# Patient Record
Sex: Male | Born: 1935 | Race: White | Hispanic: No | Marital: Married | Smoking: Former smoker
Health system: Southern US, Community
[De-identification: ages and names within clinical notes are randomized; demographics above are authoritative.]

## PROBLEM LIST (undated history)

## (undated) DIAGNOSIS — E7800 Pure hypercholesterolemia, unspecified: Secondary | ICD-10-CM

## (undated) DIAGNOSIS — B029 Zoster without complications: Secondary | ICD-10-CM

## (undated) DIAGNOSIS — R519 Headache, unspecified: Secondary | ICD-10-CM

## (undated) DIAGNOSIS — IMO0001 Reserved for inherently not codable concepts without codable children: Secondary | ICD-10-CM

## (undated) DIAGNOSIS — R51 Headache: Secondary | ICD-10-CM

## (undated) DIAGNOSIS — F419 Anxiety disorder, unspecified: Secondary | ICD-10-CM

## (undated) DIAGNOSIS — R112 Nausea with vomiting, unspecified: Secondary | ICD-10-CM

## (undated) DIAGNOSIS — E559 Vitamin D deficiency, unspecified: Secondary | ICD-10-CM

## (undated) DIAGNOSIS — I4891 Unspecified atrial fibrillation: Secondary | ICD-10-CM

## (undated) DIAGNOSIS — N400 Enlarged prostate without lower urinary tract symptoms: Secondary | ICD-10-CM

## (undated) DIAGNOSIS — I499 Cardiac arrhythmia, unspecified: Secondary | ICD-10-CM

## (undated) DIAGNOSIS — Z87442 Personal history of urinary calculi: Secondary | ICD-10-CM

## (undated) DIAGNOSIS — R42 Dizziness and giddiness: Secondary | ICD-10-CM

## (undated) DIAGNOSIS — K219 Gastro-esophageal reflux disease without esophagitis: Secondary | ICD-10-CM

## (undated) DIAGNOSIS — M1990 Unspecified osteoarthritis, unspecified site: Secondary | ICD-10-CM

## (undated) DIAGNOSIS — I10 Essential (primary) hypertension: Secondary | ICD-10-CM

## (undated) DIAGNOSIS — E119 Type 2 diabetes mellitus without complications: Secondary | ICD-10-CM

## (undated) DIAGNOSIS — J40 Bronchitis, not specified as acute or chronic: Secondary | ICD-10-CM

## (undated) DIAGNOSIS — K589 Irritable bowel syndrome without diarrhea: Secondary | ICD-10-CM

## (undated) DIAGNOSIS — N189 Chronic kidney disease, unspecified: Secondary | ICD-10-CM

## (undated) DIAGNOSIS — G4730 Sleep apnea, unspecified: Secondary | ICD-10-CM

## (undated) DIAGNOSIS — M4800 Spinal stenosis, site unspecified: Secondary | ICD-10-CM

## (undated) DIAGNOSIS — H9190 Unspecified hearing loss, unspecified ear: Secondary | ICD-10-CM

## (undated) DIAGNOSIS — R4189 Other symptoms and signs involving cognitive functions and awareness: Secondary | ICD-10-CM

## (undated) DIAGNOSIS — Z9889 Other specified postprocedural states: Secondary | ICD-10-CM

## (undated) HISTORY — PX: TONSILLECTOMY: SUR1361

## (undated) HISTORY — PX: DENTAL SURGERY: SHX609

## (undated) HISTORY — PX: COLONOSCOPY: SHX174

---

## 2011-07-13 DIAGNOSIS — R972 Elevated prostate specific antigen [PSA]: Secondary | ICD-10-CM | POA: Insufficient documentation

## 2011-07-13 DIAGNOSIS — I10 Essential (primary) hypertension: Secondary | ICD-10-CM | POA: Insufficient documentation

## 2011-07-13 DIAGNOSIS — M545 Low back pain, unspecified: Secondary | ICD-10-CM | POA: Insufficient documentation

## 2011-07-13 DIAGNOSIS — I4891 Unspecified atrial fibrillation: Secondary | ICD-10-CM | POA: Insufficient documentation

## 2011-07-13 DIAGNOSIS — R21 Rash and other nonspecific skin eruption: Secondary | ICD-10-CM | POA: Insufficient documentation

## 2011-07-13 DIAGNOSIS — E119 Type 2 diabetes mellitus without complications: Secondary | ICD-10-CM | POA: Insufficient documentation

## 2011-07-13 DIAGNOSIS — G4730 Sleep apnea, unspecified: Secondary | ICD-10-CM | POA: Insufficient documentation

## 2011-07-13 DIAGNOSIS — E7800 Pure hypercholesterolemia, unspecified: Secondary | ICD-10-CM | POA: Insufficient documentation

## 2011-07-13 DIAGNOSIS — R002 Palpitations: Secondary | ICD-10-CM | POA: Insufficient documentation

## 2011-12-03 DIAGNOSIS — IMO0001 Reserved for inherently not codable concepts without codable children: Secondary | ICD-10-CM | POA: Insufficient documentation

## 2012-12-07 DIAGNOSIS — J329 Chronic sinusitis, unspecified: Secondary | ICD-10-CM | POA: Insufficient documentation

## 2012-12-07 DIAGNOSIS — R058 Other specified cough: Secondary | ICD-10-CM | POA: Insufficient documentation

## 2012-12-07 DIAGNOSIS — R05 Cough: Secondary | ICD-10-CM | POA: Insufficient documentation

## 2012-12-29 DIAGNOSIS — R05 Cough: Secondary | ICD-10-CM | POA: Insufficient documentation

## 2012-12-29 DIAGNOSIS — R053 Chronic cough: Secondary | ICD-10-CM | POA: Insufficient documentation

## 2013-04-21 DIAGNOSIS — M549 Dorsalgia, unspecified: Secondary | ICD-10-CM | POA: Insufficient documentation

## 2013-04-21 DIAGNOSIS — M7070 Other bursitis of hip, unspecified hip: Secondary | ICD-10-CM | POA: Insufficient documentation

## 2013-06-21 DIAGNOSIS — M48061 Spinal stenosis, lumbar region without neurogenic claudication: Secondary | ICD-10-CM | POA: Insufficient documentation

## 2013-06-23 DIAGNOSIS — R635 Abnormal weight gain: Secondary | ICD-10-CM | POA: Insufficient documentation

## 2013-10-10 DIAGNOSIS — I10 Essential (primary) hypertension: Secondary | ICD-10-CM | POA: Insufficient documentation

## 2013-12-25 ENCOUNTER — Ambulatory Visit: Payer: Self-pay | Admitting: Urology

## 2014-01-01 ENCOUNTER — Emergency Department: Payer: Self-pay | Admitting: Emergency Medicine

## 2014-01-01 LAB — BASIC METABOLIC PANEL
Anion Gap: 7 (ref 7–16)
BUN: 25 mg/dL — ABNORMAL HIGH (ref 7–18)
Calcium, Total: 9.2 mg/dL (ref 8.5–10.1)
Chloride: 103 mmol/L (ref 98–107)
Co2: 28 mmol/L (ref 21–32)
Creatinine: 1.2 mg/dL (ref 0.60–1.30)
EGFR (African American): >60
EGFR (Non-African Amer.): >60
Glucose: 109 mg/dL — ABNORMAL HIGH (ref 65–99)
Osmolality: 281 (ref 275–301)
Potassium: 4.3 mmol/L (ref 3.5–5.1)
Sodium: 138 mmol/L (ref 136–145)

## 2014-01-01 LAB — TROPONIN I: Troponin-I: <0.02 ng/mL

## 2014-01-01 LAB — CBC
HCT: 49.5 % (ref 40.0–52.0)
HGB: 15.8 g/dL (ref 13.0–18.0)
MCH: 27.7 pg (ref 26.0–34.0)
MCHC: 31.9 g/dL — ABNORMAL LOW (ref 32.0–36.0)
MCV: 87 fL (ref 80–100)
Platelet: 199 10*3/uL (ref 150–440)
RBC: 5.71 10*6/uL (ref 4.40–5.90)
RDW: 13.8 % (ref 11.5–14.5)
WBC: 9.5 10*3/uL (ref 3.8–10.6)

## 2014-05-07 ENCOUNTER — Ambulatory Visit
Admit: 2014-05-07 | Disposition: A | Discharge status: Home / Self Care | Payer: Self-pay | Attending: Urology | Admitting: Urology

## 2014-06-19 ENCOUNTER — Encounter: Payer: Self-pay | Admitting: Podiatry

## 2014-06-19 ENCOUNTER — Ambulatory Visit (INDEPENDENT_AMBULATORY_CARE_PROVIDER_SITE_OTHER): Payer: Medicare Other | Admitting: Podiatry

## 2014-06-19 VITALS — BP 117/46 | HR 66 | Resp 16 | Ht 70.0 in | Wt 226.0 lb

## 2014-06-19 DIAGNOSIS — L600 Ingrowing nail: Secondary | ICD-10-CM

## 2014-06-19 MED ORDER — CEPHALEXIN 500 MG PO CAPS: 500.0000 mg | ORAL_CAPSULE | Freq: Three times a day (TID) | ORAL | Status: DC

## 2014-06-19 NOTE — Patient Instructions (Signed)

## 2014-06-19 NOTE — Progress Notes (Signed)
Subjective:    Patient ID: Jared Aye., male    DOB: 12-15-1932, 81 y.o.   MRN: [id number]  HPI  81 year old male presents the office today with concern of an infected ingrown toenail to the  Right big toe. He states his been ongoing for approximately 10 days. He states that he keep skin recurrent ingrown toenails however nursing his facility will trim the nail. He states he was soaking his foot in Epson salts which seems to help some all of the redness and the pain continues. He denies any recent antibiotic. No other treatments. No other complaints at this time.   Review of Systems  HENT: Positive for hearing loss.   Gastrointestinal: Positive for diarrhea.  Endocrine:       Increase urination  Genitourinary: Positive for urgency and frequency.  Musculoskeletal: Positive for back pain.       Joint pain   Neurological: Positive for light-headedness.  Hematological: Bruises/bleeds easily.  Psychiatric/Behavioral: The patient is nervous/anxious.   All other systems reviewed and are negative.      Objective:   Physical Exam AAO x3, NAD DP/PT pulses palpable bilaterally, CRT less than 3 seconds Protective sensation intact with Simms Weinstein monofilament, vibratory sensation decreased, Achilles tendon reflex intact There is evidence of incurvation of both the medial and lateral aspect of the right hallux toenail tenderness palpation overlying the area. There appears to be significant amount ingrowing along the lateral portion the nail border. There is erythema and edema around the nail border with a small amount of purulence expressed. There is no ascending cellulitis, fluctuance, crepitus, malodor. There is no tenderness of the remaining nails. No other areas of tenderness to bilateral lower extremities. MMT 5/5, ROM WNL.  No open lesions or pre-ulcerative lesions.  No overlying edema, erythema, increase in warmth to bilateral lower extremities.  No pain with calf compression,  swelling, warmth, erythema bilaterally.     Assessment & Plan:   81 year old male right hallux symptomatic ingrown toenail with localized infection -Treatment options discussed including all alternatives, risks, and complications -At this time, recommended partial nail removal without chemical matricectomy to the medial and lateral aspect of the right hallux nail due to infection. Risks and complications were discussed with the patient for which they understand and  verbally consent to the procedure. Under sterile conditions a total of 3 mL of a mixture of 2% lidocaine plain and 0.5% Marcaine plain was infiltrated in a hallux block fashion. Once anesthetized, the skin was prepped in sterile fashion. A tourniquet was then applied. Next the symptomatic border of the hallux nail border was excised making sure to remove the entire offending nail border. A small amount of purulence was expressed. Once the nail was removed, the area was debrided and the underlying skin was intact. The area was irrigated and hemostasis was obtained.  No further purulence was expressed. A dry sterile dressing was applied. After application of the dressing the tourniquet was removed and there is found to be an immediate capillary refill time to the digit. The patient tolerated the procedure well any complications. Post procedure instructions were discussed the patient for which he verbally understood. Follow-up in one week for nail check or sooner if any problems are to arise. Discussed signs/symptoms of worsening infection and directed to call the office immediately should any occur or go directly to the emergency room. In the meantime, encouraged to call the office with any questions, concerns, changes symptoms. -Rx Keflex  -Follow up with  PCP for other issues mentioned in the ROS

## 2014-06-26 ENCOUNTER — Ambulatory Visit (INDEPENDENT_AMBULATORY_CARE_PROVIDER_SITE_OTHER): Payer: Medicare Other | Admitting: Podiatry

## 2014-06-26 DIAGNOSIS — L600 Ingrowing nail: Secondary | ICD-10-CM

## 2014-06-26 DIAGNOSIS — Z9889 Other specified postprocedural states: Secondary | ICD-10-CM

## 2014-06-26 NOTE — Patient Instructions (Signed)
Continue soaking in epsom salts twice a day followed by antibiotic ointment and a band-aid. Can leave uncovered at night. Continue this until completely healed.  Monitor for any signs/symptoms of infection. Call the office immediately if any occur or go directly to the emergency room. Call with any questions/concerns.  

## 2014-06-26 NOTE — Progress Notes (Signed)
Patient ID: Jared Aye., male   DOB: Mar 09, 1932, 81 y.o.   MRN: [id number]  Subjective: 81 year old male presents the office today follow-up evaluation status post right hallux partial nail avulsion preformed last week. He states that overall he is doing well and is no pain to the area. He has been soaking his foot twice a day Epson salts cover with antibiotic ointment and a bandage. He denies any pain to the area denies any surrounding redness, red streaks, drainage/purulence. Denies any systemic complaints as fevers, chills, nausea, vomiting. He's been continuing with antibiotic solid is not taking as directed and takes it twice a day. No other complaints at this time in no acute changes his last appointment.  Objective: AAO 3, NAD DP/PT pulses, CRT less than 3 seconds Neurological status unchanged Right hallux status post partial nail avulsion which is healing well for this timeframe. There is a small amount of granulation tissue the procedure site. There is no tenderness to palpation overlying the area. There is a small statement of erythema around the procedure site have there is no ascending cellulitis, fluctuance, crepitus, drainage/purulence, malodor. No other areas of tenderness to bilateral lower extremities.  No other open lesions or pre-ulcerative lesions bilaterally.   Assessment: 1-week status post right partial nail avulsion on the doing well  Plan: -Treatment options discussed including all alternatives, risks, and complications -Continue soaking in Epson salt soaks twice a day covering with antibiotic ointment and a Band-Aid during the day. Can leave the area uncovered at night. Continue this until the area has completely healed. Follow-up in 2 weeks or sooner if any problems are to arise. In the meantime I encouraged and call the office with any questions, concerns, changes symptoms.

## 2014-06-27 ENCOUNTER — Encounter
Admission: RE | Admit: 2014-06-27 | Discharge: 2014-06-27 | Disposition: A | Discharge status: Home / Self Care | Payer: Medicare Other | Source: Ambulatory Visit | Attending: Urology | Admitting: Urology

## 2014-06-27 DIAGNOSIS — N200 Calculus of kidney: Secondary | ICD-10-CM | POA: Diagnosis not present

## 2014-06-27 DIAGNOSIS — Z01812 Encounter for preprocedural laboratory examination: Secondary | ICD-10-CM | POA: Diagnosis not present

## 2014-06-27 HISTORY — DX: Headache, unspecified: R51.9

## 2014-06-27 HISTORY — DX: Gastro-esophageal reflux disease without esophagitis: K21.9

## 2014-06-27 HISTORY — DX: Essential (primary) hypertension: I10

## 2014-06-27 HISTORY — DX: Chronic kidney disease, unspecified: N18.9

## 2014-06-27 HISTORY — DX: Type 2 diabetes mellitus without complications: E11.9

## 2014-06-27 HISTORY — DX: Cardiac arrhythmia, unspecified: I49.9

## 2014-06-27 HISTORY — DX: Reserved for inherently not codable concepts without codable children: IMO0001

## 2014-06-27 HISTORY — DX: Unspecified osteoarthritis, unspecified site: M19.90

## 2014-06-27 HISTORY — DX: Headache: R51

## 2014-06-27 HISTORY — DX: Anxiety disorder, unspecified: F41.9

## 2014-06-27 HISTORY — DX: Spinal stenosis, site unspecified: M48.00

## 2014-06-27 LAB — BASIC METABOLIC PANEL
Anion gap: 6 (ref 5–15)
BUN: 31 mg/dL — ABNORMAL HIGH (ref 6–20)
CO2: 28 mmol/L (ref 22–32)
Calcium: 9.3 mg/dL (ref 8.9–10.3)
Chloride: 103 mmol/L (ref 101–111)
Creatinine, Ser: 1.36 mg/dL — ABNORMAL HIGH (ref 0.61–1.24)
GFR calc Af Amer: 55 mL/min — ABNORMAL LOW (ref >60)
GFR calc non Af Amer: 48 mL/min — ABNORMAL LOW (ref >60)
Glucose, Bld: 178 mg/dL — ABNORMAL HIGH (ref 65–99)
Potassium: 4.1 mmol/L (ref 3.5–5.1)
Sodium: 137 mmol/L (ref 135–145)

## 2014-06-27 LAB — CBC
HCT: 48 % (ref 40.0–52.0)
Hemoglobin: 15.6 g/dL (ref 13.0–18.0)
MCH: 27.8 pg (ref 26.0–34.0)
MCHC: 32.4 g/dL (ref 32.0–36.0)
MCV: 85.8 fL (ref 80.0–100.0)
Platelets: 180 10*3/uL (ref 150–440)
RBC: 5.59 MIL/uL (ref 4.40–5.90)
RDW: 14.1 % (ref 11.5–14.5)
WBC: 9 10*3/uL (ref 3.8–10.6)

## 2014-06-27 NOTE — Patient Instructions (Signed)
  Your procedure is scheduled on: Monday 6/20 Report to Day Surgery. Medical Mall Entrance To find out your arrival time please call [phone] between 1PM - 3PM on Friday 6/17.  Remember: Instructions that are not followed completely may result in serious medical risk, up to and including death, or upon the discretion of your surgeon and anesthesiologist your surgery may need to be rescheduled.    __x__ 1. Do not eat food or drink liquids after midnight. No gum chewing or hard candies.     __x__ 2. No Alcohol for 24 hours before or after surgery.   ____ 3. Bring all medications with you on the day of surgery if instructed.    __x__ 4. Notify your doctor if there is any change in your medical condition     (cold, fever, infections).     Do not wear jewelry, make-up, hairpins, clips or nail polish.  Do not wear lotions, powders, or perfumes.   Do not shave 48 hours prior to surgery. Men may shave face and neck.  Do not bring valuables to the hospital.    [facility] is not responsible for any belongings or valuables.               Contacts, dentures or bridgework may not be worn into surgery.  Leave your suitcase in the car. After surgery it may be brought to your room.  For patients admitted to the hospital, discharge time is determined by your                treatment team.   Patients discharged the day of surgery will not be allowed to drive home.   Please read over the following fact sheets that you were given:   Surgical Site Infection Prevention   __x__ Take these medicines the morning of surgery with A SIP OF WATER:    1. metoprolol  2. flecainide  3.   4.  5.  6.  ____ Fleet Enema (as directed)   ____ Use CHG Soap as directed  ____ Use inhalers on the day of surgery  __x__ Stop metformin 2 days prior to surgery    ____ Take 1/2 of usual insulin dose the night before surgery and none on the morning of surgery.   __x__ Stop Coumadin/Plavix/aspirin on Stop as  directed by Dr Erlene Quan  ____ Stop Anti-inflammatories on    ____ Stop supplements until after surgery.    ____ Bring C-Pap to the hospital.

## 2014-06-28 NOTE — OR Nursing (Signed)
Cleared by Dr Clayborn Bigness 06/18/14

## 2014-07-01 NOTE — H&P (Signed)
Jared Ball 05/11/2014 11:15 AM Location: [facility] Patient #: [id number] DOB: 10/30/1932 Married / Language: Undefined / Race: Refused to Report/Unreported Male    History of Present Illness(Shannon A McGowan, PA-C; 05/11/2014 11:40 AM) The patient is a 81 year old male presenting to discuss diagnostic procedure results. The patient had a CT scan (CT Urogram). The diagnostic test was performed on - Date: (05/07/2014). Current symptoms include other (gross hematuria). Note for "Follow up diagnostic procedure": REASON FOR EXAM: LABS 1ST UROGRAM Hematuria workup Hematuria COMMENTS: PROCEDURE: KCT - KCT ABDOMEN/PELVIS W/WO - May 07 2014 12:02PM CLINICAL DATA: Gross hematuria four months ago and 1 week ago. History of enlarged prostate gland. No previous relevant surgery. Initial encounter. EXAM: CT ABDOMEN AND PELVIS WITHOUT AND WITH CONTRAST TECHNIQUE: Multidetector CT imaging of the abdomen and pelvis was performed following the standard protocol before and following the bolus administration of intravenous contrast. CONTRAST: 125 ml Omnipaque 300. COMPARISON: Renal ultrasound 12/25/2013. FINDINGS: Lower chest: Mild linear scarring or atelectasis at both lung bases. The lung bases are otherwise clear. There is no pleural or pericardial effusion. Coronary artery calcifications are noted. There are possible aortic valvular calcifications. Hepatobiliary: The liver is normal in density without focal abnormality. No evidence of gallstones, gallbladder wall thickening or biliary dilatation. Pancreas: Unremarkable. No pancreatic ductal dilatation or surrounding inflammatory changes. Spleen: Normal in size without focal abnormality. Adrenals/Urinary Tract: Both adrenal glands appear normal.There are 2 left renal calculi, the largest in the interpolar region measuring 11 mm on image 38. No evidence of right renal, ureteral or bladder calculus.  Post-contrast, both kidneys enhance normally. There is no evidence of renal mass or urothelial lesion. The bladder is thick-walled and mildly trabeculated. No focal bladder lesion identified. Stomach/Bowel: No evidence of bowel wall thickening, distention or surrounding inflammatory change.Moderate diverticulosis of the descending and sigmoid colon. The appendix appears normal. Vascular/Lymphatic: There are no enlarged abdominal or pelvic lymph nodes. Moderate aortoiliac atherosclerosis. The left renal vein is circumaortic. Reproductive: The prostate gland is enlarged, measuring up to 5.8 x 5.0 cm transverse. Prostatic tissue protrudes into the bladder base. Other: No evidence of abdominal wall mass or hernia. Musculoskeletal: No acute or significant osseous findings. Moderate degenerative changes are present throughout the spine. IMPRESSION: 1. Left-sided nephrolithiasis. No evidence of ureteral calculus or hydronephrosis. 2. No evidence of renal mass or focal urothelial lesion. The bladder is thick-walled and trabeculated, likely related to chronic bladder outlet obstruction from prostatomegaly. 3. Colonic diverticulosis. 4. Atherosclerosis as described. Possible aortic valvular calcifications. Electronically Signed By: Richardean Sale M.D.      Problem List/Past Medical(Ramona Williams; 05/11/2014 11:20 AM) Sleep apnea (780.57  G47.30) Hyperlipidemia (272.4  E78.5) Hypertension (401.9  I10) Obesity (278.00  E66.9) Nodular prostate (600.10  N40.2) Gross hematuria (599.71  R31.0) Heartburn (787.1  R12) Anxiety (300.00  F41.1) Spinal stenosis (724.00  M48.00) Diabetes (250.00  E11.9) Heart disease (429.9  I51.9) Atrial fibrillation (427.31  I48.91) Arrhythmia (427.9  I49.9) Arthritis (716.90  M19.90)    Allergies(Ramona Williams; 05/11/2014 11:20 AM) Vaccines - Albumin. Zostavax Adhesive Tape    Family History(Ramona Williams; 05/11/2014  11:20 AM) Leukemia. Father. Heart Failure. Mother.    Social History(Ramona Williams; 05/11/2014 11:20 AM) Tobacco use. Former smoker. smoked for 35 yeasr. Quit for 30 yrs. Alcohol use. Non-drinker.    Travel History(Ramona Jimmye Norman; 05/11/2014 11:20 AM) Have you traveled internationally in the last 21 days?. No.    Medication History(Ramona Williams; 05/11/2014 11:27 AM) Glimepiride (2MG  Tablet, 1 Oral  daily) Active. Flecainide Acetate (100MG  Tablet, 1 Oral two times daily) Active. Lovastatin (40MG  Tablet, 1 Oral daily) Active. Hydrochlorothiazide (12.5MG  Capsule, 1 Oral daily) Active. Finasteride (5MG  Tablet, 1 Oral daily) Active. MetFORMIN HCl (500MG  Tablet, 1 Oral three times daily) Active. Metoprolol Tartrate (50MG  Tablet, 1/2 tab Oral at bedtime) Active. Aspirin EC (81MG  Tablet DR, 1 Oral daily) Active. Tamsulosin HCl (0.4MG  Capsule, 1 Oral at bedtime) Active. POT CHLOR ER (1 daily) Active. (10 MEQ) Medications Reconciled.    Past Surgical History(Ramona Williams; 05/11/2014 11:20 AM) None. 04/27/2014    Review of Systems(Ramona Williams; 05/11/2014 11:19 AM) General:Not Present- Chills, Fatigue, Fever and Weight Gain > 10lbs.. Skin:Not Present- Hair Loss, Pruritus and Rash. HEENT:Not Present- Eye Pain, Decreased Hearing, Runny Nose, Snoring and Dry Mucous Membranes. Neck:Not Present- Neck Pain and Swollen Glands. Respiratory:Not Present- Chronic Cough, Difficulty Breathing on Exertion and Wakes up from Sleep Wheezing or Short of Breath. Breast:Not Present- Gynecomastia. Cardiovascular:Not Present- Edema, Palpitations and Shortness of Breath. Gastrointestinal:Present- Diarrheaand Heartburn. Not Present- Constipation, Nausea and Vomiting. Male Genitourinary:Present- Change in Urinary Stream, Difficulty with Erection, Frequency, Hematuria, Nocturia, Urgencyand Urine Leakage. Note:see HPI Musculoskeletal:Not Present- Back Pain and Joint  Pain. Neurological:Not Present- Decreased Memory, Headaches and Seizures. Psychiatric:Not Present- Anxiety and Depression. Endocrine:Not Present- Excessive Sweating, Heat Intolerance and Tired/Sluggish. Hematology:Not Present- Abnormal Bleeding, Anemia, Blood Transfusion and Enlarged Lymph Nodes.    Vitals(Ramona Williams; 05/11/2014 11:27 AM) 05/11/2014 11:27 AM Weight: 233.25 lb Height: 70.5 in Height was reported by patient. Body Surface Area: 2.29 m Body Mass Index: 32.99 kg/m Pulse: 69 (Regular) BP: 122/71 (Sitting, Left Arm, Standard)     Physical Exam(Shannon A McGowan, PA-C; 05/13/2014 2:03 PM) The physical exam findings are as follows:   General Mental Status- Alert. Posture- Normal posture.   Integumentary General Characteristics:Overall examination of the patient's skin reveals - no rashes.   Head and Neck Head- normocephalic, atraumatic with no lesions or palpable masses. Neck Global Assessment- non-tender and no lymphadenopathy. Thyroid Gland Characteristics- normal size and consistency.   Eye Pupil- Left- Normal, Direct reaction to light normal, Equal, Regular and Round. Right- Normal, Direct reaction to light normal, Equal, Regular and Round. Bilateral- Normal, Direct reaction to light normal, Equal, Regular and Round.   ENMT Mouth and Throat Oral Cavity/Oropharynx:Teeth- no dentures.   Chest and Lung Exam Chest and lung exam reveals - normal excursion with symmetric chest walls, quiet, even and easy respiratory effort with no use of accessory muscles and on auscultation, normal breath sounds, no adventitious sounds and normal vocal resonance.   Cardiovascular Auscultation:Rhythm- Regular. Heart Sounds- S1 WNL and S2 WNL. Carotid arteries- No Carotid bruit.   Abdomen Inspection:Inspection of the abdomen reveals - No Hernias. Palpation/Percussion:Palpation and Percussion of the abdomen reveal -  Soft, Non Tender, No Rebound tenderness, No Rigidity (guarding) and No hepatosplenomegaly. Bladder- Non-palpable. Kidney (Left):Other Characteristics- Non Tender. Kidney (Right):Other Characteristics- Non Tender. Auscultation:Auscultation of the abdomen reveals - Bowel sounds normal.   Male Genitourinary- Did not examine.   Rectal- Did not examine.   Neurologic Neurologic evaluation reveals - alert and oriented x 3 with no impairment of recent or remote memory.   Neuropsychiatric Examination of related systems reveals - The patient is well-nourished and well-groomed. The patient's mood and affect are described as - normal.   Musculoskeletal Global Assessment Right Lower Extremity- normal strength and tone and normal range of motion without pain. Left Lower Extremity- normal strength and tone and normal range of motion without pain.   Lymphatic General  Lymphatics Description- Normal .    Assessment & Plan(Shannon A McGowan, PA-C; 05/13/2014 2:11 PM) Gross hematuria (599.71  R31.0) Story: CT Urogram on 05/07/2014 IMPRESSION: 1. Left-sided nephrolithiasis. No evidence of ureteral calculus or hydronephrosis. 2. No evidence of renal mass or focal urothelial lesion. The bladder is thick-walled and trabeculated, likely related to chronic bladder outlet obstruction from prostatomegaly. 3. Colonic diverticulosis. 4. Atherosclerosis as described. Possible aortic valvular calcifications. Impression: The left ureter did not completely opacify on the CT Urogram, therefore cystoscopy with bilateral retrogrades would need to be performed to complete the hematuria workup. He also has stones in his left kidney that could be addressed at that time. He is currently undergoing evaluation by his cardiologist for an arrhythmia. We will have to postpone this procedure until his cardiac work up is complete and he has cardiac clearance. Current Plans l Pt Education - How  to access health information online: discussed with patient and provided information.  Obesity (278.00  E66.9) Impression: Lifestyle regarding diet.   Signed electronically by Nori Riis, PA-C (05/13/2014 2:14 PM)

## 2014-07-02 ENCOUNTER — Encounter
Admission: RE | Disposition: A | Discharge status: Home / Self Care | Payer: Self-pay | Source: Ambulatory Visit | Attending: Urology

## 2014-07-02 ENCOUNTER — Encounter: Payer: Self-pay | Admitting: *Deleted

## 2014-07-02 ENCOUNTER — Ambulatory Visit: Payer: Medicare Other | Admitting: Certified Registered Nurse Anesthetist

## 2014-07-02 ENCOUNTER — Ambulatory Visit
Admission: RE | Admit: 2014-07-02 | Discharge: 2014-07-02 | Disposition: A | Discharge status: Home / Self Care | Payer: Medicare Other | Source: Ambulatory Visit | Attending: Urology | Admitting: Urology

## 2014-07-02 DIAGNOSIS — N200 Calculus of kidney: Secondary | ICD-10-CM | POA: Diagnosis not present

## 2014-07-02 DIAGNOSIS — R310 Gross hematuria: Secondary | ICD-10-CM | POA: Diagnosis present

## 2014-07-02 HISTORY — PX: CYSTOSCOPY W/ RETROGRADES: SHX1426

## 2014-07-02 HISTORY — PX: URETEROSCOPY WITH HOLMIUM LASER LITHOTRIPSY: SHX6645

## 2014-07-02 HISTORY — PX: CYSTOSCOPY WITH STENT PLACEMENT: SHX5790

## 2014-07-02 LAB — GLUCOSE, CAPILLARY
Glucose-Capillary: 199 mg/dL — ABNORMAL HIGH (ref 65–99)
Glucose-Capillary: 204 mg/dL — ABNORMAL HIGH (ref 65–99)

## 2014-07-02 LAB — URINALYSIS COMPLETE WITH MICROSCOPIC (ARMC ONLY)
Bacteria, UA: NONE SEEN
Bilirubin Urine: NEGATIVE
Glucose, UA: NEGATIVE mg/dL
Leukocytes, UA: NEGATIVE
Nitrite: NEGATIVE
Protein, ur: NEGATIVE mg/dL
Specific Gravity, Urine: 1.016 (ref 1.005–1.030)
Squamous Epithelial / LPF: NONE SEEN
pH: 6 (ref 5.0–8.0)

## 2014-07-02 SURGERY — URETEROSCOPY, WITH LITHOTRIPSY USING HOLMIUM LASER
Anesthesia: General | Laterality: Left | Wound class: Clean Contaminated

## 2014-07-02 MED ORDER — MIDAZOLAM HCL 5 MG/5ML IJ SOLN: 0.5000 mg | Freq: Once | INTRAMUSCULAR | Status: DC

## 2014-07-02 MED ORDER — ACETAMINOPHEN 10 MG/ML IV SOLN
INTRAVENOUS | Status: DC | PRN
  Administered 2014-07-02: 1000 mg via INTRAVENOUS

## 2014-07-02 MED ORDER — FENTANYL CITRATE (PF) 100 MCG/2ML IJ SOLN
INTRAMUSCULAR | Status: AC
  Filled 2014-07-02: qty 2

## 2014-07-02 MED ORDER — KETOROLAC TROMETHAMINE 30 MG/ML IJ SOLN
INTRAMUSCULAR | Status: DC | PRN
  Administered 2014-07-02: 15 mg via INTRAVENOUS

## 2014-07-02 MED ORDER — OXYBUTYNIN CHLORIDE 5 MG PO TABS: 5.0000 mg | ORAL_TABLET | Freq: Three times a day (TID) | ORAL | Status: DC | PRN

## 2014-07-02 MED ORDER — ROCURONIUM BROMIDE 100 MG/10ML IV SOLN
INTRAVENOUS | Status: DC | PRN
  Administered 2014-07-02: 35 mg via INTRAVENOUS

## 2014-07-02 MED ORDER — FENTANYL CITRATE (PF) 100 MCG/2ML IJ SOLN
INTRAMUSCULAR | Status: DC | PRN
  Administered 2014-07-02: 50 ug via INTRAVENOUS
  Administered 2014-07-02: 150 ug via INTRAVENOUS

## 2014-07-02 MED ORDER — CEFAZOLIN SODIUM 1-5 GM-% IV SOLN
1.0000 g | Freq: Once | INTRAVENOUS | Status: AC
  Administered 2014-07-02: 1 g via INTRAVENOUS

## 2014-07-02 MED ORDER — ONDANSETRON HCL 4 MG/2ML IJ SOLN: 4.0000 mg | Freq: Once | INTRAMUSCULAR | Status: DC | PRN

## 2014-07-02 MED ORDER — CEFAZOLIN SODIUM 1-5 GM-% IV SOLN
INTRAVENOUS | Status: AC
  Filled 2014-07-02: qty 50

## 2014-07-02 MED ORDER — PROPOFOL 10 MG/ML IV BOLUS
INTRAVENOUS | Status: DC | PRN
  Administered 2014-07-02: 150 mg via INTRAVENOUS

## 2014-07-02 MED ORDER — SODIUM CHLORIDE 0.9 % IR SOLN
Status: DC | PRN
  Administered 2014-07-02: 2500 mL

## 2014-07-02 MED ORDER — HYDROCODONE-ACETAMINOPHEN 5-325 MG PO TABS
1.0000 | ORAL_TABLET | Freq: Four times a day (QID) | ORAL | Status: AC | PRN
  Administered 2014-07-02: 1 via ORAL

## 2014-07-02 MED ORDER — SODIUM CHLORIDE 0.9 % IV SOLN
INTRAVENOUS | Status: DC
Start: 2014-07-02 — End: 2014-07-02
  Administered 2014-07-02 (×3): via INTRAVENOUS

## 2014-07-02 MED ORDER — LIDOCAINE HCL (CARDIAC) 20 MG/ML IV SOLN
INTRAVENOUS | Status: DC | PRN
  Administered 2014-07-02: 100 mg via INTRAVENOUS

## 2014-07-02 MED ORDER — HYDROCODONE-ACETAMINOPHEN 5-325 MG PO TABS: 1.0000 | ORAL_TABLET | Freq: Four times a day (QID) | ORAL | Status: DC | PRN

## 2014-07-02 MED ORDER — HYDROCODONE-ACETAMINOPHEN 5-325 MG PO TABS
ORAL_TABLET | ORAL | Status: AC
  Filled 2014-07-02: qty 1

## 2014-07-02 MED ORDER — FENTANYL CITRATE (PF) 100 MCG/2ML IJ SOLN
25.0000 ug | INTRAMUSCULAR | Status: DC | PRN
  Administered 2014-07-02 (×4): 25 ug via INTRAVENOUS

## 2014-07-02 MED ORDER — ONDANSETRON HCL 4 MG/2ML IJ SOLN
INTRAMUSCULAR | Status: DC | PRN
  Administered 2014-07-02: 4 mg via INTRAVENOUS

## 2014-07-02 MED ORDER — ACETAMINOPHEN 10 MG/ML IV SOLN
INTRAVENOUS | Status: AC
  Filled 2014-07-02: qty 100

## 2014-07-02 MED ORDER — EPHEDRINE SULFATE 50 MG/ML IJ SOLN
INTRAMUSCULAR | Status: DC | PRN
  Administered 2014-07-02: 10 mg via INTRAVENOUS

## 2014-07-02 MED ORDER — IOTHALAMATE MEGLUMINE 43 % IV SOLN
INTRAVENOUS | Status: DC | PRN
  Administered 2014-07-02: 15 mL

## 2014-07-02 MED ORDER — SUCCINYLCHOLINE CHLORIDE 20 MG/ML IJ SOLN
INTRAMUSCULAR | Status: DC | PRN
  Administered 2014-07-02: 100 mg via INTRAVENOUS

## 2014-07-02 SURGICAL SUPPLY — 37 items
ADAPTER SCOPE UROLOK II (MISCELLANEOUS) ×4 IMPLANT
BAG DRAIN CYSTO-URO LG1000N (MISCELLANEOUS) ×4 IMPLANT
BASKET ZERO TIP 1.9FR (BASKET) ×4 IMPLANT
CATH URETL 5X70 OPEN END (CATHETERS) ×4 IMPLANT
CNTNR SPEC 2.5X3XGRAD LEK (MISCELLANEOUS) ×2
CONRAY 43 FOR UROLOGY 50M (MISCELLANEOUS) ×4 IMPLANT
CONT SPEC 4OZ STER OR WHT (MISCELLANEOUS) ×2
CONTAINER SPEC 2.5X3XGRAD LEK (MISCELLANEOUS) ×2 IMPLANT
GLOVE BIO SURGEON STRL SZ 6.5 (GLOVE) ×3 IMPLANT
GLOVE BIO SURGEON STRL SZ7 (GLOVE) ×8 IMPLANT
GLOVE BIO SURGEON STRL SZ7.5 (GLOVE) ×4 IMPLANT
GLOVE BIO SURGEONS STRL SZ 6.5 (GLOVE) ×1
GOWN STRL REUS W/ TWL LRG LVL3 (GOWN DISPOSABLE) ×4 IMPLANT
GOWN STRL REUS W/TWL LRG LVL3 (GOWN DISPOSABLE) ×4
GUIDEWIRE SUPER STIFF .035X180 (WIRE) ×4 IMPLANT
INTRODUCER DILATOR DOUBLE (INTRODUCER) ×4 IMPLANT
JELLY LUB 2OZ STRL (MISCELLANEOUS) ×2
JELLY LUBE 2OZ STRL (MISCELLANEOUS) ×2 IMPLANT
KIT RM TURNOVER CYSTO AR (KITS) ×4 IMPLANT
LASER HOLMIUM 3 IN 1 DAY (MISCELLANEOUS) ×4 IMPLANT
LASER HOLMIUM SU 200UM (MISCELLANEOUS) ×4 IMPLANT
PACK CYSTO AR (MISCELLANEOUS) ×4 IMPLANT
PREP PVP WINGED SPONGE (MISCELLANEOUS) ×4 IMPLANT
PUMP SINGLE ACTION SAP (PUMP) IMPLANT
SENSORWIRE 0.038 NOT ANGLED (WIRE) ×4
SET CYSTO W/LG BORE CLAMP LF (SET/KITS/TRAYS/PACK) ×4 IMPLANT
SHEATH URETERAL 12FR 45CM (SHEATH) ×4 IMPLANT
SHEATH URETERAL 12FRX35CM (MISCELLANEOUS) ×4 IMPLANT
SOL .9 NS 3000ML IRR  AL (IV SOLUTION) ×2
SOL .9 NS 3000ML IRR UROMATIC (IV SOLUTION) ×2 IMPLANT
SOL PREP PVP 2OZ (MISCELLANEOUS)
SOLUTION PREP PVP 2OZ (MISCELLANEOUS) IMPLANT
STENT URET 6FRX24 CONTOUR (STENTS) IMPLANT
STENT URET 6FRX26 CONTOUR (STENTS) ×4 IMPLANT
SYRINGE IRR TOOMEY STRL 70CC (SYRINGE) ×4 IMPLANT
WATER STERILE IRR 1000ML POUR (IV SOLUTION) ×4 IMPLANT
WIRE SENSOR 0.038 NOT ANGLED (WIRE) ×2 IMPLANT

## 2014-07-02 NOTE — Progress Notes (Signed)
States pain is better after pain med

## 2014-07-02 NOTE — Anesthesia Preprocedure Evaluation (Signed)
Anesthesia Evaluation  Patient identified by MRN, date of birth, ID band Patient awake    Reviewed: Allergy & Precautions, NPO status , Patient's Chart, lab work & pertinent test results  Airway Mallampati: II  TM Distance: >3 FB Neck ROM: Limited    Dental  (+) Teeth Intact   Pulmonary shortness of breath, with exertion and lying, former smoker,  breath sounds clear to auscultation  Pulmonary exam normal       Cardiovascular Exercise Tolerance: Poor hypertension, Pt. on medications and Pt. on home beta blockers Normal cardiovascular examRhythm:Regular Rate:Normal     Neuro/Psych    GI/Hepatic   Endo/Other  diabetes, Type 2BG 191 this morning.  Renal/GU      Musculoskeletal   Abdominal (+) + obese,  Abdomen: soft.    Peds  Hematology   Anesthesia Other Findings   Reproductive/Obstetrics                             Anesthesia Physical Anesthesia Plan  ASA: III  Anesthesia Plan: General   Post-op Pain Management:    Induction: Intravenous  Airway Management Planned: LMA  Additional Equipment:   Intra-op Plan:   Post-operative Plan: Extubation in OR  Informed Consent: I have reviewed the patients History and Physical, chart, labs and discussed the procedure including the risks, benefits and alternatives for the proposed anesthesia with the patient or authorized representative who has indicated his/her understanding and acceptance.     Plan Discussed with: CRNA  Anesthesia Plan Comments:         Anesthesia Quick Evaluation

## 2014-07-02 NOTE — Discharge Instructions (Addendum)
You have a ureteral stent in place.  This is a tube that extends from your kidney to your bladder.  This may cause urinary bleeding, burning with urination, and urinary frequency.  Please call our office or present to the ED if you develop fevers >101 or pain which is not able to be controlled with oral pain medications.  You may be given either Flomax and/ or ditropan to help with bladder spasms and stent pain in addition to pain medications.    [facility] [address] [place], [place] [address] [phone] AMBULATORY SURGERY  DISCHARGE INSTRUCTIONS   1) The drugs that you were given will stay in your system until tomorrow so for the next 24 hours you should not:  A) Drive an automobile B) Make any legal decisions C) Drink any alcoholic beverage   2) You may resume regular meals tomorrow.  Today it is better to start with liquids and gradually work up to solid foods.  You may eat anything you prefer, but it is better to start with liquids, then soup and crackers, and gradually work up to solid foods.   3) Please notify your doctor immediately if you have any unusual bleeding, trouble breathing, redness and pain at the surgery site, drainage, fever, or pain not relieved by medication. 4)   5) Your post-operative visit with Dr.    George Ina                                 is: Date:                        Time:    Please call to schedule your post-operative visit.  6) Additional Instructions: AMBULATORY SURGERY  DISCHARGE INSTRUCTIONS   The drugs that you were given will stay in your system until tomorrow so for the next 24 hours you should not:  Drive an automobile Make any legal decisions Drink any alcoholic beverage   You may resume regular meals tomorrow.  Today it is better to start with liquids and gradually work up to solid foods.  You may eat anything you prefer, but it is better to start with liquids, then soup and crackers,  and gradually work up to solid foods.   Please notify your doctor immediately if you have any unusual bleeding, trouble breathing, redness and pain at the surgery site, drainage, fever, or pain not relieved by medication.   Your post-operative visit with Dr.    George Ina                                 is: Date:                        Time:    Please call to schedule your post-operative visit.  Additional Instructions:

## 2014-07-02 NOTE — Brief Op Note (Signed)
07/02/2014  9:52 AM  PATIENT:  Jared Ball.  81 y.o. male  PRE-OPERATIVE DIAGNOSIS:  LEFT NEPHROLITHIASIS, GROSS HEMATURIA   POST-OPERATIVE DIAGNOSIS:  LEFT NEPHROLITHIASIS, GROSS HEMATURIA   PROCEDURE:  Procedure(s): URETEROSCOPY WITH HOLMIUM LASER LITHOTRIPSY (Left) CYSTOSCOPY WITH STENT PLACEMENT (Left) CYSTOSCOPY WITH RETROGRADE PYELOGRAM (Bilateral)  SURGEON:  Surgeon(s) and Role:    * Hollice Espy, MD - Primary  ASSISTANTS: none   ANESTHESIA:   general  EBL:  Total I/O In: 1000 [I.V.:1000] Out: 10 [Blood:10]  Drains: 6x26 Fr JJ ureteral stent on left (no string)  Specimen: stone fragment  COUNTS CORRECT: YES  PLAN OF CARE: Discharge to home after PACU  PATIENT DISPOSITION:  PACU - hemodynamically stable.

## 2014-07-02 NOTE — Anesthesia Postprocedure Evaluation (Signed)
  Anesthesia Post-op Note  Patient: Jared Ball.  Procedure(s) Performed: Procedure(s): URETEROSCOPY WITH HOLMIUM LASER LITHOTRIPSY (Left) CYSTOSCOPY WITH STENT PLACEMENT (Left) CYSTOSCOPY WITH RETROGRADE PYELOGRAM (Bilateral)  Anesthesia type:General  Patient location: PACU  Post pain: Pain level controlled  Post assessment: Post-op Vital signs reviewed, Patient's Cardiovascular Status Stable, Respiratory Function Stable, Patent Airway and No signs of Nausea or vomiting  Post vital signs: Reviewed and stable  Last Vitals:  Filed Vitals:   07/02/14 0941  BP: 135/68  Pulse: 65  Temp: 37.4 C  Resp: 16    Level of consciousness: awake, alert  and patient cooperative  Complications: No apparent anesthesia complications

## 2014-07-02 NOTE — Interval H&P Note (Signed)
History and Physical Interval Note:  07/02/2014 7:41 AM  Jared Ball.  has presented today for surgery, with the diagnosis of NEPHROLITHIASIS  The various methods of treatment have been discussed with the patient and family. After consideration of risks, benefits and other options for treatment, the patient has consented to  Procedure(s): URETEROSCOPY WITH HOLMIUM LASER LITHOTRIPSY (Left) CYSTOSCOPY WITH STENT PLACEMENT (Left) CYSTOSCOPY WITH RETROGRADE PYELOGRAM (Bilateral) as a surgical intervention .  The patient's history has been reviewed, patient examined, no change in status, stable for surgery.  I have reviewed the patient's chart and labs.  Questions were answered to the patient's satisfaction.     Hollice Espy

## 2014-07-02 NOTE — Transfer of Care (Signed)
Immediate Anesthesia Transfer of Care Note  Patient: Jared Ball.  Procedure(s) Performed: Procedure(s): URETEROSCOPY WITH HOLMIUM LASER LITHOTRIPSY (Left) CYSTOSCOPY WITH STENT PLACEMENT (Left) CYSTOSCOPY WITH RETROGRADE PYELOGRAM (Bilateral)  Patient Location: PACU  Anesthesia Type:General  Level of Consciousness: awake, alert , oriented and patient cooperative  Airway & Oxygen Therapy: Patient Spontanous Breathing and Patient connected to nasal cannula oxygen  Post-op Assessment: Report given to RN and Post -op Vital signs reviewed and stable  Post vital signs: Reviewed and stable  Last Vitals:  Filed Vitals:   07/02/14 0941  BP: 135/68  Pulse: 65  Temp: 37.4 C  Resp: 16    Complications: No apparent anesthesia complications

## 2014-07-02 NOTE — Op Note (Signed)
Date of procedure: 07/02/2014  Preoperative diagnosis:  1.  Gross hematuria 2. Left kidney stones  Postoperative diagnosis:  1. Same as above   Procedure: 1. Cystoscopy 2. Bilateral retrograde pyelogram 3. Left ureteroscopy, laser lithotripsy, left ureteral stent placement  Surgeon: Hollice Espy, MD  Anesthesia: General  Complications: None  Intraoperative findings: 2 nonobstructing stones measuring up to 11 mm obliterated, fragment's basketed out of collecting system  EBL: minimal  Specimens: Stone fragment     Drains: 6 x 26 double-J ureteral stent on left  Indication: Jared Ball. is a 81 y.o. patient with gross hematuria found to have nonobstructing stones on the left side.  After reviewing the management options for treatment, he elected to proceed with the above surgical procedure(s). We have discussed the potential benefits and risks of the procedure, side effects of the proposed treatment, the likelihood of the patient achieving the goals of the procedure, and any potential problems that might occur during the procedure or recuperation. Informed consent has been obtained.  Description of procedure:  The patient was taken to the operating room and general anesthesia was induced.  The patient was placed in the dorsal lithotomy position, prepped and draped in the usual sterile fashion, and preoperative antibiotics were administered. A preoperative time-out was performed.   A 22 French rigid cystoscope was advanced per urethra into the bladder. Of note, the prostate was noted to be significantly enlarged with trilobar coaptation, elevated bladder neck, and significant median lobe. It was also somewhat friable and bled with manipulation.  A formal cystoscopy was then performed which revealed a moderate to severely trabeculated bladder without any other significant lesions, ulcerations, tumors, or stones. Trigone appeared to be normal with clear reflux from both UOs. The  right ureteral orifice was then cannulated using a 5 [place] open-ended ureteral catheter. A retrograde pyelogram was then performed revealing a normal caliber ureter with some narrowing and J hooking distally, and normal upper tract collecting system with fine delicate long infundibula without filling defects or hydronephrosis. The same exact procedure was performed on the left side revealing no abnormalities or filling defects. The wire was then placed up to level of the renal pelvis under fluoroscopic guidance. Dual lumen sheath was then used to introduce a second Super Stiff wire to into the collecting system. A Cook 45 cm access sheath was then advanced over the Super Stiff wire up to level of the proximal ureter and the inner cannula was removed. His past about difficulty under fluoroscopic guidance. A flexible ureteroscope was then used to survey all calyces. Of note, he did have a somewhat complex collecting system with long fine infundibula. The larger of the 2 stones was identified in a upper midpole calyx. A 200  laser fiber was then brought in and using settings of 0.2 J and 50 Hz, the stone was fragmented into approximately 10-15 pieces. Each of those pieces were then individually basketed out of the ureter using a 1.9 to this nitinol basket. Once the stone fragments were cleared from this calyx, the remaining stone was identified. It was able to be basketed and pulled out of the collecting system in one piece. A retrograde pyelogram was performed to the level of the UPJ again to create a roadmap of the collecting system. The entire collecting system was then carefully reevaluated to ensure that there was no residual stone fragment.  Scope was then backed down the length of the ureter removing the access sheath along the way. There was  one small proximal ureteral abrasion from the access sheath but otherwise no other stone fragments or ureteral injuries noted. The safety wire was then backloaded over  a rigid cystoscope and a 6 x 26 French double-J ureteral stent was introduced up to level of the collecting system. The wire was then partially withdrawn and a coil was noted within the upper tract. The wire was then fully withdrawn and a coil was noted within the bladder.. The bladder was then drained. The patient was then repositioned in the supine position, reversed from anesthesia, and taken to the PACU in stable condition. There were no complications in this case.  Plan: The patient will follow-up in 1-2 weeks for cystoscopy, stent removal.  Hollice Espy, M.D.

## 2014-07-02 NOTE — Progress Notes (Signed)
Pt voided in bed   Somewhat blood tinged

## 2014-07-03 ENCOUNTER — Encounter: Payer: Self-pay | Admitting: Urology

## 2014-07-05 LAB — STONE ANALYSIS
Ca Oxalate,Monohydr.: 97 %
Ca phos cry stone ql IR: 3 %
Stone Weight KSTONE: 106.3 mg

## 2014-07-10 ENCOUNTER — Ambulatory Visit: Payer: Medicare Other | Admitting: Podiatry

## 2014-07-12 ENCOUNTER — Ambulatory Visit (INDEPENDENT_AMBULATORY_CARE_PROVIDER_SITE_OTHER): Payer: Medicare Other | Admitting: Urology

## 2014-07-12 ENCOUNTER — Encounter: Payer: Self-pay | Admitting: Urology

## 2014-07-12 ENCOUNTER — Encounter: Payer: Self-pay | Admitting: Emergency Medicine

## 2014-07-12 ENCOUNTER — Emergency Department
Admission: EM | Admit: 2014-07-12 | Discharge: 2014-07-12 | Disposition: A | Discharge status: Home / Self Care | Payer: Medicare Other | Attending: Emergency Medicine | Admitting: Emergency Medicine

## 2014-07-12 VITALS — BP 144/78 | HR 87 | Ht 70.0 in | Wt 223.7 lb

## 2014-07-12 DIAGNOSIS — R338 Other retention of urine: Secondary | ICD-10-CM

## 2014-07-12 DIAGNOSIS — I10 Essential (primary) hypertension: Secondary | ICD-10-CM | POA: Insufficient documentation

## 2014-07-12 DIAGNOSIS — Z7982 Long term (current) use of aspirin: Secondary | ICD-10-CM | POA: Diagnosis not present

## 2014-07-12 DIAGNOSIS — Z91040 Latex allergy status: Secondary | ICD-10-CM | POA: Insufficient documentation

## 2014-07-12 DIAGNOSIS — N401 Enlarged prostate with lower urinary tract symptoms: Secondary | ICD-10-CM

## 2014-07-12 DIAGNOSIS — Z79899 Other long term (current) drug therapy: Secondary | ICD-10-CM | POA: Insufficient documentation

## 2014-07-12 DIAGNOSIS — R319 Hematuria, unspecified: Secondary | ICD-10-CM | POA: Diagnosis not present

## 2014-07-12 DIAGNOSIS — R339 Retention of urine, unspecified: Secondary | ICD-10-CM | POA: Insufficient documentation

## 2014-07-12 DIAGNOSIS — N200 Calculus of kidney: Secondary | ICD-10-CM

## 2014-07-12 DIAGNOSIS — E119 Type 2 diabetes mellitus without complications: Secondary | ICD-10-CM | POA: Insufficient documentation

## 2014-07-12 DIAGNOSIS — Z87891 Personal history of nicotine dependence: Secondary | ICD-10-CM | POA: Diagnosis not present

## 2014-07-12 DIAGNOSIS — N2889 Other specified disorders of kidney and ureter: Secondary | ICD-10-CM | POA: Diagnosis not present

## 2014-07-12 LAB — URINALYSIS COMPLETE WITH MICROSCOPIC (ARMC ONLY)
Specific Gravity, Urine: 1.009 (ref 1.005–1.030)
Squamous Epithelial / LPF: NONE SEEN
pH: 0 — ABNORMAL LOW (ref 5.0–8.0)

## 2014-07-12 NOTE — ED Notes (Signed)
Urinary catheter bag changed to leg bag per MD verbal order.

## 2014-07-12 NOTE — ED Notes (Signed)
Patient reports relief of groin pressure after catheter insertion.

## 2014-07-12 NOTE — ED Notes (Addendum)
Patient presents to ED via [facility] from [place] c/o urinary retention. Patient reports "I dribbled a little tonight with blood." Patient had lithotripsy performed  07/02/14 and had stent placed. Patient denies any trouble voiding after surgery, reports had blood in urine for the first 4 days after surgery. Patient reports groin pain and difficulty voiding began around midnight. Patient alert and oriented x 4 days, respirations even and unlabored. Call bell within reach, family at bedside. MD at bedside.

## 2014-07-12 NOTE — ED Notes (Signed)

## 2014-07-12 NOTE — ED Notes (Signed)
Bladder scan complete 737 mL.

## 2014-07-12 NOTE — Discharge Instructions (Signed)
Acute Urinary Retention °Acute urinary retention is the temporary inability to urinate. °This is a common problem in older men. As men age their prostates become larger and block the flow of urine from the bladder. This is usually a problem that has come on gradually.  °HOME CARE INSTRUCTIONS °If you are sent home with a Foley catheter and a drainage system, you will need to discuss the best course of action with your health care provider. While the catheter is in, maintain a good intake of fluids. Keep the drainage bag emptied and lower than your catheter. This is so that contaminated urine will not flow back into your bladder, which could lead to a urinary tract infection. °There are two main types of drainage bags. One is a large bag that usually is used at night. It has a good capacity that will allow you to sleep through the night without having to empty it. The second type is called a leg bag. It has a smaller capacity, so it needs to be emptied more frequently. However, the main advantage is that it can be attached by a leg strap and can go underneath your clothing, allowing you the freedom to move about or leave your home. °Only take over-the-counter or prescription medicines for pain, discomfort, or fever as directed by your health care provider.  °SEEK MEDICAL CARE IF: °· You develop a low-grade fever. °· You experience spasms or leakage of urine with the spasms. °SEEK IMMEDIATE MEDICAL CARE IF:  °· You develop chills or fever. °· Your catheter stops draining urine. °· Your catheter falls out. °· You start to develop increased bleeding that does not respond to rest and increased fluid intake. °MAKE SURE YOU: °· Understand these instructions. °· Will watch your condition. °· Will get help right away if you are not doing well or get worse. °Document Released: 04/06/2000 Document Revised: 01/03/2013 Document Reviewed: 06/09/2012 °Exit[employer]® Patient Information ©2015 [employer]. This information is not  intended to replace advice given to you by your health care provider. Make sure you discuss any questions you have with your health care provider. ° °

## 2014-07-12 NOTE — ED Provider Notes (Signed)
[facility] Emergency Department Provider Note  ____________________________________________  Time seen: 3:15AM  I have reviewed the triage vital signs and the nursing notes.   HISTORY  Chief Complaint Urinary Retention     HPI Jared Ball is a 81 y.o. male presents to ED via [facility] from [place] c/o urinary retention. Patient reports "I dribbled a little tonight with blood." Patient had lithotripsy performed 07/02/14 and had stent placed. Patient denies any trouble voiding after surgery, reports had blood in urine for the first 4 days after surgery. Patient reports groin pain and difficulty voiding began around midnight.     Past Medical History  Diagnosis Date  . Dysrhythmia   . Hypertension   . Diabetes mellitus without complication   . Anxiety   . Chronic kidney disease   . GERD (gastroesophageal reflux disease)   . Headache   . Arthritis   . Spinal stenosis   . Shortness of breath dyspnea     There are no active problems to display for this patient.   Past Surgical History  Procedure Laterality Date  . Tonsillectomy    . Colonoscopy    . Ureteroscopy with holmium laser lithotripsy Left 07/02/2014    Procedure: URETEROSCOPY WITH HOLMIUM LASER LITHOTRIPSY;  Surgeon: Hollice Espy, MD;  Location: [facility] ORS;  Service: Urology;  Laterality: Left;  . Cystoscopy with stent placement Left 07/02/2014    Procedure: CYSTOSCOPY WITH STENT PLACEMENT;  Surgeon: Hollice Espy, MD;  Location: [facility] ORS;  Service: Urology;  Laterality: Left;  . Cystoscopy w/ retrogrades Bilateral 07/02/2014    Procedure: CYSTOSCOPY WITH RETROGRADE PYELOGRAM;  Surgeon: Hollice Espy, MD;  Location: [facility] ORS;  Service: Urology;  Laterality: Bilateral;    Current Outpatient Rx  Name  Route  Sig  Dispense  Refill  . acetaminophen (TYLENOL) 500 MG tablet   Oral   Take by mouth.         Marland Kitchen aspirin EC 81 MG tablet   Oral   Take 81 mg by mouth daily.           . cephALEXin (KEFLEX) 500 MG capsule   Oral   Take 1 capsule (500 mg total) by mouth 3 (three) times daily.   30 capsule   2   . finasteride (PROSCAR) 5 MG tablet   Oral   Take 5 mg by mouth daily.      4   . flecainide (TAMBOCOR) 100 MG tablet   Oral   Take 100 mg by mouth 2 (two) times daily.      6   . glimepiride (AMARYL) 2 MG tablet   Oral   Take 1 mg by mouth every other day.          . hydrochlorothiazide (MICROZIDE) 12.5 MG capsule   Oral   Take 12.5 mg by mouth daily.       5   . HYDROcodone-acetaminophen (NORCO/VICODIN) 5-325 MG per tablet   Oral   Take 1-2 tablets by mouth every 6 (six) hours as needed for moderate pain.   15 tablet   0   . lovastatin (MEVACOR) 40 MG tablet   Oral   Take 40 mg by mouth at bedtime.          . metFORMIN (GLUCOPHAGE) 500 MG tablet   Oral   Take 500 mg by mouth 3 (three) times daily.      3   . metoprolol tartrate (LOPRESSOR) 25 MG tablet   Oral  Take 12.5 mg by mouth daily.         Marland Kitchen oxybutynin (DITROPAN) 5 MG tablet   Oral   Take 1 tablet (5 mg total) by mouth every 8 (eight) hours as needed for bladder spasms.   30 tablet   0   . pimecrolimus (ELIDEL) 1 % cream   Topical   Apply 1 application topically 2 (two) times daily as needed.         . tamsulosin (FLOMAX) 0.4 MG CAPS capsule   Oral   Take 0.4 mg by mouth daily.          Marland Kitchen triamcinolone ointment (KENALOG) 0.1 %   Topical   Apply 1 application topically 2 (two) times daily as needed.       0     Allergies Tape; Latex; Sulfa antibiotics; and Zoster vaccine live  No family history on file.  Social History History  Substance Use Topics  . Smoking status: Former Research scientist (life sciences)  . Smokeless tobacco: Never Used  . Alcohol Use: 0.0 oz/week    0 Standard drinks or equivalent per week     Comment: red wine occasionally    Review of Systems  Constitutional: Negative for fever. Eyes: Negative for visual changes. ENT: Negative for sore  throat. Cardiovascular: Negative for chest pain. Respiratory: Negative for shortness of breath. Gastrointestinal: Negative for abdominal pain, vomiting and diarrhea. Genitourinary: Positive for urinary retention and hematuria Musculoskeletal: Negative for back pain. Skin: Negative for rash. Neurological: Negative for headaches, focal weakness or numbness.   10-point ROS otherwise negative.  ____________________________________________   PHYSICAL EXAM:  VITAL SIGNS: ED Triage Vitals  Enc Vitals Group     BP 07/12/14 0244 194/100 mmHg     Pulse Rate 07/12/14 0244 72     Resp 07/12/14 0244 18     Temp 07/12/14 0244 99.1 F (37.3 C)     Temp Source 07/12/14 0244 Oral     SpO2 07/12/14 0244 95 %     Weight 07/12/14 0244 218 lb (98.884 kg)     Height 07/12/14 0244 5\' 10"  (1.778 m)     Head Cir --      Peak Flow --      Pain Score 07/12/14 0246 7     Pain Loc --      Pain Edu? --      Excl. in [place]? --      Constitutional: Alert and oriented. Apparent discomfort Eyes: Conjunctivae are normal. PERRL. Normal extraocular movements. ENT   Head: Normocephalic and atraumatic.   Nose: No congestion/rhinnorhea.   Mouth/Throat: Mucous membranes are moist.   Neck: No stridor.  Cardiovascular: Normal rate, regular rhythm. Normal and symmetric distal pulses are present in all extremities. No murmurs, rubs, or gallops. Respiratory: Normal respiratory effort without tachypnea nor retractions. Breath sounds are clear and equal bilaterally. No wheezes/rales/rhonchi. Gastrointestinal: Tender to palpation in suprapubic region No distention. There is no CVA tenderness. Genitourinary: deferred Musculoskeletal: Nontender with normal range of motion in all extremities. No joint effusions.  No lower extremity tenderness nor edema. Neurologic:  Normal speech and language. No gross focal neurologic deficits are appreciated. Speech is normal.  Skin:  Skin is warm, dry and intact. No rash  noted. Psychiatric: Mood and affect are normal. Speech and behavior are normal. Patient exhibits appropriate insight and judgment.  ____________________________________________    LABS (pertinent positives/negatives)  Labs Reviewed  URINALYSIS COMPLETEWITH MICROSCOPIC ([place]) - Abnormal; Notable for the following:  Color, Urine RED (*)    APPearance CLOUDY (*)    Glucose, UA   (*)    Value: TEST NOT REPORTED DUE TO COLOR INTERFERENCE OF URINE PIGMENT   Bilirubin Urine   (*)    Value: TEST NOT REPORTED DUE TO COLOR INTERFERENCE OF URINE PIGMENT   Ketones, ur   (*)    Value: TEST NOT REPORTED DUE TO COLOR INTERFERENCE OF URINE PIGMENT   Hgb urine dipstick   (*)    Value: TEST NOT REPORTED DUE TO COLOR INTERFERENCE OF URINE PIGMENT   pH 0.0 (*)    Protein, ur   (*)    Value: TEST NOT REPORTED DUE TO COLOR INTERFERENCE OF URINE PIGMENT   Nitrite   (*)    Value: TEST NOT REPORTED DUE TO COLOR INTERFERENCE OF URINE PIGMENT   Leukocytes, UA   (*)    Value: TEST NOT REPORTED DUE TO COLOR INTERFERENCE OF URINE PIGMENT   Bacteria, UA RARE (*)    All other components within normal limits           INITIAL IMPRESSION / ASSESSMENT AND PLAN / ED COURSE  Pertinent labs & imaging results that were available during my care of the patient were reviewed by me and considered in my medical decision making (see chart for details). Given history of physical exam for a catheter placed following conversation with Dr. Erlene Quan. Approximately 1 L of  hematuria appreciated. Patient was fitted with a leg bag with recommendation follow-up with Dr. Erlene Quan as planned this morning.  ____________________________________________   FINAL CLINICAL IMPRESSION(S) / ED DIAGNOSES  Final diagnoses:  Acute urinary retention      Gregor Hams, MD 07/17/14 [phone]

## 2014-07-12 NOTE — ED Notes (Signed)
MD at bedside. 

## 2014-07-13 ENCOUNTER — Encounter: Payer: Self-pay | Admitting: Urology

## 2014-07-14 LAB — CULTURE, URINE COMPREHENSIVE

## 2014-07-18 ENCOUNTER — Ambulatory Visit (INDEPENDENT_AMBULATORY_CARE_PROVIDER_SITE_OTHER): Payer: Medicare Other | Admitting: Urology

## 2014-07-18 ENCOUNTER — Encounter: Payer: Self-pay | Admitting: Urology

## 2014-07-18 VITALS — BP 136/77 | HR 84 | Ht 70.0 in | Wt 219.5 lb

## 2014-07-18 DIAGNOSIS — N401 Enlarged prostate with lower urinary tract symptoms: Secondary | ICD-10-CM

## 2014-07-18 DIAGNOSIS — R338 Other retention of urine: Secondary | ICD-10-CM

## 2014-07-18 DIAGNOSIS — N200 Calculus of kidney: Secondary | ICD-10-CM | POA: Diagnosis not present

## 2014-07-18 DIAGNOSIS — N2889 Other specified disorders of kidney and ureter: Secondary | ICD-10-CM

## 2014-07-18 NOTE — Progress Notes (Signed)
Simple Catheter Placement  Due to urinary retention patient is present today for a foley cath placement.  Patient was cleaned and prepped in a sterile fashion with betadine and lidocaine jelly 2% was instilled into the urethra.  A 16 FRcoude foley catheter was inserted, urine return was noted  113ml, urine was red in color.  The balloon was filled with 10cc of sterile water.  A leg bag was attached for drainage. Patient was also given a night bag to take home and was given instruction on how to change from one bag to another.  Patient was given instruction on proper catheter care.  Patient tolerated well, no complications were noted   Preformed by: Toniann Fail, LPN  Additional notes/ Follow up: pt will f/u in 1 week for a voiding trial

## 2014-07-18 NOTE — Progress Notes (Signed)
07/12/2014 10:26 AM   Jared Ball [birth year] [id number]  Referring provider: Kirk Ruths, MD [place], [place] [address]  Chief Complaint  Patient presents with  . Cysto Stent Removal    HPI: 81 yo M originally scheduled today (07/12/14) for cystoscopy, stent removal however presented overnight to the emergency room with inability to urinate.  Per ER notes, upon placement of catheter, approximate 1 L of light pink urine was drained. He was discharged from the ER with a leg bag. His UA was ? suspicious for possible infection.  No urine culture was sent.  He denied any fevers or chills. He was not discharged on any antibiotics.   He does have a history of BPH currently on Flomax and finasteride.   PMH: Past Medical History  Diagnosis Date  . Dysrhythmia   . Hypertension   . Diabetes mellitus without complication   . Anxiety   . Chronic kidney disease   . GERD (gastroesophageal reflux disease)   . Headache   . Arthritis   . Spinal stenosis   . Shortness of breath dyspnea     Surgical History: Past Surgical History  Procedure Laterality Date  . Tonsillectomy    . Colonoscopy    . Ureteroscopy with holmium laser lithotripsy Left 07/02/2014    Procedure: URETEROSCOPY WITH HOLMIUM LASER LITHOTRIPSY;  Surgeon: Hollice Espy, MD;  Location: [facility];  Service: Urology;  Laterality: Left;  . Cystoscopy with stent placement Left 07/02/2014    Procedure: CYSTOSCOPY WITH STENT PLACEMENT;  Surgeon: Hollice Espy, MD;  Location: [facility];  Service: Urology;  Laterality: Left;  . Cystoscopy w/ retrogrades Bilateral 07/02/2014    Procedure: CYSTOSCOPY WITH RETROGRADE PYELOGRAM;  Surgeon: Hollice Espy, MD;  Location: [facility];  Service: Urology;  Laterality: Bilateral;    Home Medications:    Medication List       This list is accurate as of: 07/12/14 11:59 PM.  Always use your most recent med list.               acetaminophen 500 MG tablet  Commonly  known as:  TYLENOL  Take by mouth.     aspirin EC 81 MG tablet  Take 81 mg by mouth daily.     cephALEXin 500 MG capsule  Commonly known as:  KEFLEX  Take 1 capsule (500 mg total) by mouth 3 (three) times daily.     ELIDEL 1 % cream  Generic drug:  pimecrolimus  Apply 1 application topically 2 (two) times daily as needed.     finasteride 5 MG tablet  Commonly known as:  PROSCAR  Take 5 mg by mouth daily.     flecainide 100 MG tablet  Commonly known as:  TAMBOCOR  Take 100 mg by mouth 2 (two) times daily.     glimepiride 2 MG tablet  Commonly known as:  AMARYL  Take 1 mg by mouth every other day.     hydrochlorothiazide 12.5 MG capsule  Commonly known as:  MICROZIDE  Take 12.5 mg by mouth daily.     HYDROcodone-acetaminophen 5-325 MG per tablet  Commonly known as:  NORCO/VICODIN  Take 1-2 tablets by mouth every 6 (six) hours as needed for moderate pain.     lovastatin 40 MG tablet  Commonly known as:  MEVACOR  Take 40 mg by mouth at bedtime.     metFORMIN 500 MG tablet  Commonly known as:  GLUCOPHAGE  Take 500 mg by mouth 3 (three)  times daily.     metoprolol tartrate 25 MG tablet  Commonly known as:  LOPRESSOR  Take 12.5 mg by mouth daily.     metoprolol 50 MG tablet  Commonly known as:  LOPRESSOR  TAKE 0.5 TABLETS (25 MG TOTAL) BY MOUTH NIGHTLY.     oxybutynin 5 MG tablet  Commonly known as:  DITROPAN  Take 1 tablet (5 mg total) by mouth every 8 (eight) hours as needed for bladder spasms.     tamsulosin 0.4 MG Caps capsule  Commonly known as:  FLOMAX  Take 0.4 mg by mouth daily.     triamcinolone ointment 0.1 %  Commonly known as:  KENALOG  Apply 1 application topically 2 (two) times daily as needed.        Allergies:  Allergies  Allergen Reactions  . Tape Rash  . Latex Itching  . Sulfa Antibiotics Nausea And Vomiting  . Zoster Vaccine Live Hives and Rash    localized Localized; Vaccine for shingles    Family History: History reviewed. No  pertinent family history.  Social History:  reports that he has quit smoking. He has never used smokeless tobacco. He reports that he drinks alcohol. He reports that he does not use illicit drugs.    Physical Exam: BP 144/78 mmHg  Pulse 87  Ht 5\' 10"  (1.778 m)  Wt 223 lb 11.2 oz (101.47 kg)  BMI 32.10 kg/m2  Constitutional:  Alert and oriented, No acute distress. HEENT: [place] AT, moist mucus membranes.  Trachea midline, no masses. Cardiovascular: No clubbing, cyanosis, or edema. Respiratory: Normal respiratory effort, no increased work of breathing. GI: Abdomen is soft, nontender, nondistended, no abdominal masses GU: No CVA tenderness. Foley bag in place draining light pink urine, no clots. Skin: No rashes, bruises or suspicious lesions. Neurologic: Grossly intact, no focal deficits, moving all 4 extremities. Psychiatric: Normal mood and affect.  Laboratory Data: Lab Results  Component Value Date   WBC 9.0 06/27/2014   HGB 15.6 06/27/2014   HCT 48.0 06/27/2014   MCV 85.8 06/27/2014   PLT 180 06/27/2014    Lab Results  Component Value Date   CREATININE 1.36* 06/27/2014     Urinalysis    Component Value Date/Time   COLORURINE RED* 07/12/2014 0255   APPEARANCEUR CLOUDY* 07/12/2014 0255   LABSPEC 1.009 07/12/2014 0255   PHURINE 0.0* 07/12/2014 0255   GLUCOSEU * 07/12/2014 0255    TEST NOT REPORTED DUE TO COLOR INTERFERENCE OF URINE PIGMENT   HGBUR * 07/12/2014 0255    TEST NOT REPORTED DUE TO COLOR INTERFERENCE OF URINE PIGMENT   BILIRUBINUR * 07/12/2014 0255    TEST NOT REPORTED DUE TO COLOR INTERFERENCE OF URINE PIGMENT   KETONESUR * 07/12/2014 0255    TEST NOT REPORTED DUE TO COLOR INTERFERENCE OF URINE PIGMENT   PROTEINUR * 07/12/2014 0255    TEST NOT REPORTED DUE TO COLOR INTERFERENCE OF URINE PIGMENT   NITRITE * 07/12/2014 0255    TEST NOT REPORTED DUE TO COLOR INTERFERENCE OF URINE PIGMENT   LEUKOCYTESUR * 07/12/2014 0255    TEST NOT REPORTED DUE TO COLOR  INTERFERENCE OF URINE PIGMENT    Assessment & Plan:  81 year old male status post left ureteroscopy, laser lithotripsy on 07/02/2014 who was scheduled for cystoscopy stent removal today, however, presented early this morning to the emergency room with urinary retention 1 L. Prefer to defer cystoscopy stent removal today with urine culture to rule out infection as the underlying cause of his retention  although more likely related to his underlying BPH. Plan to have patient return next week for cystoscopy, stent removal and Foley catheter removal/void trial.  1. Kidney stones - CULTURE, URINE COMPREHENSIVE  2. Urinary retention due to benign prostatic hyperplasia -continue flomax/ finasteride -void trial next week   Return in about 1 week (around 07/19/2014) for cysto/ stent removal and voiding trial.  Hollice Espy, MD  [facility] [address], [place], [place] [address] [phone]

## 2014-07-18 NOTE — Patient Instructions (Signed)
Acute Urinary Retention °Acute urinary retention is the temporary inability to urinate. °This is a common problem in older men. As men age their prostates become larger and block the flow of urine from the bladder. This is usually a problem that has come on gradually.  °HOME CARE INSTRUCTIONS °If you are sent home with a Foley catheter and a drainage system, you will need to discuss the best course of action with your health care provider. While the catheter is in, maintain a good intake of fluids. Keep the drainage bag emptied and lower than your catheter. This is so that contaminated urine will not flow back into your bladder, which could lead to a urinary tract infection. °There are two main types of drainage bags. One is a large bag that usually is used at night. It has a good capacity that will allow you to sleep through the night without having to empty it. The second type is called a leg bag. It has a smaller capacity, so it needs to be emptied more frequently. However, the main advantage is that it can be attached by a leg strap and can go underneath your clothing, allowing you the freedom to move about or leave your home. °Only take over-the-counter or prescription medicines for pain, discomfort, or fever as directed by your health care provider.  °SEEK MEDICAL CARE IF: °· You develop a low-grade fever. °· You experience spasms or leakage of urine with the spasms. °SEEK IMMEDIATE MEDICAL CARE IF:  °· You develop chills or fever. °· Your catheter stops draining urine. °· Your catheter falls out. °· You start to develop increased bleeding that does not respond to rest and increased fluid intake. °MAKE SURE YOU: °· Understand these instructions. °· Will watch your condition. °· Will get help right away if you are not doing well or get worse. °Document Released: 04/06/2000 Document Revised: 01/03/2013 Document Reviewed: 06/09/2012 °Exit[employer]® Patient Information ©2015 [employer]. This information is not  intended to replace advice given to you by your health care provider. Make sure you discuss any questions you have with your health care provider. ° °

## 2014-07-18 NOTE — Progress Notes (Signed)
3:32 PM   Jared Ball 1932/11/08 [id number]  Referring provider: Kirk Ruths, MD [name], [place] [address]  Chief Complaint  Patient presents with  . Cysto Stent Removal     HPI: 81 yo M with 2 fairly large left lower pole stones measuring up to 11 mm status post left ureteroscopy on 07/02/2014.   Fortunately, postoperatively he developed urinary retention last week requiring Foley catheter placement with over a liter in his bladder. Follow-up urine culture was negative for any signs of infection. He returns today for cystoscopy, stent removal and a voiding trial.   Oral, he is doing well. He has no complaints today other than anxious to have his catheter removed. No gross hematuria, fevers, or chills.   He does have a history of BPH currently on Flomax and finasteride.   PMH: Past Medical History  Diagnosis Date  . Dysrhythmia   . Hypertension   . Diabetes mellitus without complication   . Anxiety   . Chronic kidney disease   . GERD (gastroesophageal reflux disease)   . Headache   . Arthritis   . Spinal stenosis   . Shortness of breath dyspnea    asdfasdfasdfasfdasfdasfdsadf Surgical History: Past Surgical History  Procedure Laterality Date  . Tonsillectomy    . Colonoscopy    . Ureteroscopy with holmium laser lithotripsy Left 07/02/2014    Procedure: URETEROSCOPY WITH HOLMIUM LASER LITHOTRIPSY;  Surgeon: Jared Espy, MD;  Location: [facility];  Service: Urology;  Laterality: Left;  . Cystoscopy with stent placement Left 07/02/2014    Procedure: CYSTOSCOPY WITH STENT PLACEMENT;  Surgeon: Jared Espy, MD;  Location: [facility];  Service: Urology;  Laterality: Left;  . Cystoscopy w/ retrogrades Bilateral 07/02/2014    Procedure: CYSTOSCOPY WITH RETROGRADE PYELOGRAM;  Surgeon: Jared Espy, MD;  Location: [facility];  Service: Urology;  Laterality: Bilateral;    Home Medications:    Medication List       This list is accurate as of:  07/18/14  3:32 PM.  Always use your most recent med list.               acetaminophen 500 MG tablet  Commonly known as:  TYLENOL  Take by mouth.     aspirin EC 81 MG tablet  Take 81 mg by mouth daily.     cephALEXin 500 MG capsule  Commonly known as:  KEFLEX  Take 1 capsule (500 mg total) by mouth 3 (three) times daily.     ELIDEL 1 % cream  Generic drug:  pimecrolimus  Apply 1 application topically 2 (two) times daily as needed.     finasteride 5 MG tablet  Commonly known as:  PROSCAR  Take 5 mg by mouth daily.     flecainide 100 MG tablet  Commonly known as:  TAMBOCOR  Take 100 mg by mouth 2 (two) times daily.     glimepiride 2 MG tablet  Commonly known as:  AMARYL  Take 1 mg by mouth every other day.     hydrochlorothiazide 12.5 MG capsule  Commonly known as:  MICROZIDE  Take 12.5 mg by mouth daily.     HYDROcodone-acetaminophen 5-325 MG per tablet  Commonly known as:  NORCO/VICODIN  Take 1-2 tablets by mouth every 6 (six) hours as needed for moderate pain.     lovastatin 40 MG tablet  Commonly known as:  MEVACOR  Take 40 mg by mouth at bedtime.     metFORMIN 500 MG  tablet  Commonly known as:  GLUCOPHAGE  Take 500 mg by mouth 3 (three) times daily.     metoprolol tartrate 25 MG tablet  Commonly known as:  LOPRESSOR  Take 12.5 mg by mouth daily.     metoprolol 50 MG tablet  Commonly known as:  LOPRESSOR  TAKE 0.5 TABLETS (25 MG TOTAL) BY MOUTH NIGHTLY.     oxybutynin 5 MG tablet  Commonly known as:  DITROPAN  Take 1 tablet (5 mg total) by mouth every 8 (eight) hours as needed for bladder spasms.     tamsulosin 0.4 MG Caps capsule  Commonly known as:  FLOMAX  Take 0.4 mg by mouth daily.     triamcinolone ointment 0.1 %  Commonly known as:  KENALOG  Apply 1 application topically 2 (two) times daily as needed.        Allergies:  Allergies  Allergen Reactions  . Tape Rash  . Latex Itching  . Sulfa Antibiotics Nausea And Vomiting  . Zoster  Vaccine Live Hives and Rash    localized Localized; Vaccine for shingles    Family History: No family history on file.  Social History:  reports that he has quit smoking. He has never used smokeless tobacco. He reports that he drinks alcohol. He reports that he does not use illicit drugs.    Physical Exam: BP 136/77 mmHg  Pulse 84  Ht 5\' 10"  (1.778 m)  Wt 219 lb 8 oz (99.565 kg)  BMI 31.50 kg/m2  Constitutional:  Alert and oriented, No acute distress. HEENT: [place] AT, moist mucus membranes.  Trachea midline, no masses. Cardiovascular: No clubbing, cyanosis, or edema. Respiratory: Normal respiratory effort, no increased work of breathing. GI: Abdomen is soft, nontender, nondistended, no abdominal masses GU: No CVA tenderness.  Normal phallus with orthotopic meatus. Scrotum unremarkable. Skin: No rashes, bruises or suspicious lesions. Neurologic: Grossly intact, no focal deficits, moving all 4 extremities. Psychiatric: Normal mood and affect.  Laboratory Data: Lab Results  Component Value Date   WBC 9.0 06/27/2014   HGB 15.6 06/27/2014   HCT 48.0 06/27/2014   MCV 85.8 06/27/2014   PLT 180 06/27/2014    Lab Results  Component Value Date   CREATININE 1.36* 06/27/2014     Urinalysis    Component Value Date/Time   COLORURINE RED* 07/12/2014 0255   APPEARANCEUR CLOUDY* 07/12/2014 0255   LABSPEC 1.009 07/12/2014 0255   PHURINE 0.0* 07/12/2014 0255   GLUCOSEU * 07/12/2014 0255    TEST NOT REPORTED DUE TO COLOR INTERFERENCE OF URINE PIGMENT   HGBUR * 07/12/2014 0255    TEST NOT REPORTED DUE TO COLOR INTERFERENCE OF URINE PIGMENT   BILIRUBINUR * 07/12/2014 0255    TEST NOT REPORTED DUE TO COLOR INTERFERENCE OF URINE PIGMENT   KETONESUR * 07/12/2014 0255    TEST NOT REPORTED DUE TO COLOR INTERFERENCE OF URINE PIGMENT   PROTEINUR * 07/12/2014 0255    TEST NOT REPORTED DUE TO COLOR INTERFERENCE OF URINE PIGMENT   NITRITE * 07/12/2014 0255    TEST NOT REPORTED DUE TO COLOR  INTERFERENCE OF URINE PIGMENT   LEUKOCYTESUR * 07/12/2014 0255    TEST NOT REPORTED DUE TO COLOR INTERFERENCE OF URINE PIGMENT   UCx 07/12/14 negative  Cystoscopy/ Stent removal procedure  Patient identification was confirmed, informed consent was obtained, and patient was prepped using Betadine solution.  Lidocaine jelly was administered per urethral meatus.    Preoperative abx where received prior to procedure.    Procedure: -  Flexible cystoscope introduced, without any difficulty.   - Thorough search of the bladder revealed:    normal urethral meatus  Stent seen emanating from left ureteral orifice, grasped with stent graspers, and removed in entirety.     Post-Procedure: - Patient tolerated the procedure well  Assessment & Plan:  81 year old male status post left ureteroscopy, laser lithotripsy on 28/78/6767 s/p  Uncomplicated stent removal today. He was able to void following this procedure but nly 50 cc therefore catheter was replaced.  1. Kidney stones - follow up in 4 weeks with renal ultrasound prior to visit - advised to call if he develops any severe flank pain, hematuria, or fevers or chills.  2. Urinary retention due to benign prostatic hyperplasia -continue flomax/ finasteride -repeat voiding trial next week (nurse visit)   Return in about 4 weeks (around 08/15/2014) for f/u renal ultrasound.  Jared Espy, MD  [facility] [address], [place], [place] [address] [phone]

## 2014-07-24 ENCOUNTER — Ambulatory Visit (INDEPENDENT_AMBULATORY_CARE_PROVIDER_SITE_OTHER): Payer: Medicare Other | Admitting: Podiatry

## 2014-07-24 ENCOUNTER — Encounter: Payer: Self-pay | Admitting: Podiatry

## 2014-07-24 VITALS — BP 130/72 | HR 77 | Resp 18

## 2014-07-24 DIAGNOSIS — B351 Tinea unguium: Secondary | ICD-10-CM

## 2014-07-24 DIAGNOSIS — L600 Ingrowing nail: Secondary | ICD-10-CM

## 2014-07-24 DIAGNOSIS — M79676 Pain in unspecified toe(s): Secondary | ICD-10-CM

## 2014-07-25 ENCOUNTER — Other Ambulatory Visit (INDEPENDENT_AMBULATORY_CARE_PROVIDER_SITE_OTHER): Payer: Medicare Other | Admitting: *Deleted

## 2014-07-25 VITALS — BP 159/78 | HR 80 | Ht 69.0 in | Wt 215.6 lb

## 2014-07-25 DIAGNOSIS — R339 Retention of urine, unspecified: Secondary | ICD-10-CM | POA: Diagnosis not present

## 2014-07-25 NOTE — Progress Notes (Signed)
Fill and Pull Catheter Removal  Patient is present today for a catheter removal.  Patient was cleaned and prepped in a sterile fashion 123ml of sterile water/ saline was instilled into the bladder when the patient felt the urge to urinate. 19ml of water was then drained from the balloon.  A 16FR coude foley cath was removed from the bladder no complications were noted .  Patient was then given some time to void on their own.  Patient can void  157ml on their own after some time.  Patient tolerated well. Patient was sent home with instructions if he has any trouble to go to ER overnight and/or call office.  Preformed by: Clayborne Dana CMA

## 2014-07-26 ENCOUNTER — Encounter: Payer: Self-pay | Admitting: Podiatry

## 2014-07-26 NOTE — Progress Notes (Signed)
Patient ID: Jared Ball, male   DOB: Nov 17, 1932, 81 y.o.   MRN: [id number]  Subjective: 81 year old male presents the occipital vibration senses right hallux partial nail avulsion. He states that overall he is doing well he has no pain to the area. Denies any redness or drainage from the area. Denies any red streaks. Also states he is painful, elongated toenails for which she is unable trim himself. Denies any redness or drainage on the other nail sites. No other complaints at this time in no acute changes since last appointment.  Objective: AAO 3, NAD Neurovascular status unchanged. Right hallux status post partial nail avulsion which is healing well. There is no tenderness to palpation overlying the area. There is no swelling erythema, ascending cellulitis, flexion, crepitus, drainage, malodor. Nails are hypertrophic, dystrophic, brittle, discolored, elongated . There is no swelling erythema or drainage from the nail sites 1-5 on the left and 2-5 on the right. There is no swelling erythema or drainage on the nail sites. No open lesions or pre-ulcer lesions identified elsewhere. No other areas of tenderness to bilateral lower extremities. No overlying erythema, increase in warmth bilaterally. No pain with calf compression, swelling, warmth, erythema.  Assessment: 81 year old male follow-up evaluation right hallux partial nail avulsion, healing well; symptomatic onychomycosis  Plan: -Treatment options discussed including all alternatives, risks, and complications -Procedure site appears to be healing well. Continue to monitor for any reoccurrence or signs or symptoms of infection. Directed to call the office if any are to occur. Apply a Band-Aid for protection. -Nail sharply debrided 9 without consultation/bleeding. -Discussed the importance of daily foot inspection. -Follow-up 3 months or sooner if any problems arise. In the meantime, encouraged to call the office with any questions,  concerns, change in symptoms.   Celesta Gentile, DPM

## 2014-08-20 ENCOUNTER — Ambulatory Visit
Admission: RE | Admit: 2014-08-20 | Discharge: 2014-08-20 | Disposition: A | Discharge status: Home / Self Care | Payer: Medicare Other | Source: Ambulatory Visit | Attending: Urology | Admitting: Urology

## 2014-08-20 DIAGNOSIS — N200 Calculus of kidney: Secondary | ICD-10-CM

## 2014-08-20 DIAGNOSIS — N400 Enlarged prostate without lower urinary tract symptoms: Secondary | ICD-10-CM | POA: Insufficient documentation

## 2014-08-20 DIAGNOSIS — Z9889 Other specified postprocedural states: Secondary | ICD-10-CM | POA: Insufficient documentation

## 2014-08-21 ENCOUNTER — Encounter: Payer: Self-pay | Admitting: Urology

## 2014-08-21 ENCOUNTER — Ambulatory Visit (INDEPENDENT_AMBULATORY_CARE_PROVIDER_SITE_OTHER): Payer: Medicare Other | Admitting: Urology

## 2014-08-21 VITALS — BP 130/70 | HR 71 | Ht 70.0 in | Wt 221.1 lb

## 2014-08-21 DIAGNOSIS — N200 Calculus of kidney: Secondary | ICD-10-CM

## 2014-08-21 DIAGNOSIS — N528 Other male erectile dysfunction: Secondary | ICD-10-CM

## 2014-08-21 DIAGNOSIS — E1165 Type 2 diabetes mellitus with hyperglycemia: Secondary | ICD-10-CM | POA: Insufficient documentation

## 2014-08-21 DIAGNOSIS — R931 Abnormal findings on diagnostic imaging of heart and coronary circulation: Secondary | ICD-10-CM | POA: Insufficient documentation

## 2014-08-21 DIAGNOSIS — I493 Ventricular premature depolarization: Secondary | ICD-10-CM | POA: Insufficient documentation

## 2014-08-21 DIAGNOSIS — E669 Obesity, unspecified: Secondary | ICD-10-CM | POA: Insufficient documentation

## 2014-08-21 DIAGNOSIS — IMO0001 Reserved for inherently not codable concepts without codable children: Secondary | ICD-10-CM | POA: Insufficient documentation

## 2014-08-21 DIAGNOSIS — N401 Enlarged prostate with lower urinary tract symptoms: Secondary | ICD-10-CM

## 2014-08-21 DIAGNOSIS — N400 Enlarged prostate without lower urinary tract symptoms: Secondary | ICD-10-CM | POA: Diagnosis not present

## 2014-08-21 DIAGNOSIS — R079 Chest pain, unspecified: Secondary | ICD-10-CM | POA: Insufficient documentation

## 2014-08-21 DIAGNOSIS — R0602 Shortness of breath: Secondary | ICD-10-CM | POA: Insufficient documentation

## 2014-08-21 DIAGNOSIS — IMO0002 Reserved for concepts with insufficient information to code with codable children: Secondary | ICD-10-CM | POA: Insufficient documentation

## 2014-08-21 DIAGNOSIS — R338 Other retention of urine: Secondary | ICD-10-CM

## 2014-08-21 LAB — BLADDER SCAN AMB NON-IMAGING: Scan Result: 84

## 2014-08-21 MED ORDER — TADALAFIL 5 MG PO TABS: 5.0000 mg | ORAL_TABLET | Freq: Every day | ORAL | Status: DC | PRN

## 2014-08-21 NOTE — Progress Notes (Signed)
3:09 PM  08/21/14  Jared Ball 05-07-1932 [id number]  Referring provider: Kirk Ruths, MD [place], [place] [address]  Chief Complaint  Patient presents with  . Follow-up     HPI: 81 yo M with 2 fairly large left lower pole stones measuring up to 11 mm status post left ureteroscopy on 07/02/2014.   He developed postoperative urinary retention requiring a catheter and failed several voiding trials. His stent was removed on 07/18/2014 without difficulty. He ultimately was able to urinate after successful voiding trial.     He has no complaints today. No gross hematuria, fevers, or chills.  No dysuria.   He does have a history of BPH currently on Flomax and finasteride.  No frequency, urgency.  He is emptying his bladder fairly well but with a slower urinary stream.  Nocturia x 2.    Follow up RUS negative.  Stone analysis consistent with 97% Ca Ox monohydrate.    He does mention that he does have ED has tried Cialis in the past which helped both his voiding and erectile dysfunction.    PMH: Past Medical History  Diagnosis Date  . Dysrhythmia   . Hypertension   . Diabetes mellitus without complication   . Anxiety   . Chronic kidney disease   . GERD (gastroesophageal reflux disease)   . Headache   . Arthritis   . Spinal stenosis   . Shortness of breath dyspnea     Surgical History: Past Surgical History  Procedure Laterality Date  . Tonsillectomy    . Colonoscopy    . Ureteroscopy with holmium laser lithotripsy Left 07/02/2014    Procedure: URETEROSCOPY WITH HOLMIUM LASER LITHOTRIPSY;  Surgeon: Hollice Espy, MD;  Location: [facility];  Service: Urology;  Laterality: Left;  . Cystoscopy with stent placement Left 07/02/2014    Procedure: CYSTOSCOPY WITH STENT PLACEMENT;  Surgeon: Hollice Espy, MD;  Location: [facility];  Service: Urology;  Laterality: Left;  . Cystoscopy w/ retrogrades Bilateral 07/02/2014    Procedure: CYSTOSCOPY WITH RETROGRADE  PYELOGRAM;  Surgeon: Hollice Espy, MD;  Location: [facility];  Service: Urology;  Laterality: Bilateral;    Home Medications:    Medication List       This list is accurate as of: 08/21/14  3:09 PM.  Always use your most recent med list.               acetaminophen 500 MG tablet  Commonly known as:  TYLENOL  Take by mouth.     aspirin EC 81 MG tablet  Take 81 mg by mouth daily.     ELIDEL 1 % cream  Generic drug:  pimecrolimus  Apply 1 application topically 2 (two) times daily as needed.     finasteride 5 MG tablet  Commonly known as:  PROSCAR  Take 5 mg by mouth daily.     flecainide 100 MG tablet  Commonly known as:  TAMBOCOR  Take 100 mg by mouth 2 (two) times daily.     glimepiride 2 MG tablet  Commonly known as:  AMARYL  Take 1 mg by mouth every other day.     hydrochlorothiazide 12.5 MG capsule  Commonly known as:  MICROZIDE  Take 12.5 mg by mouth daily.     HYDROcodone-acetaminophen 5-325 MG per tablet  Commonly known as:  NORCO/VICODIN  Take 1-2 tablets by mouth every 6 (six) hours as needed for moderate pain.     lovastatin 40 MG tablet  Commonly  known as:  MEVACOR  Take 40 mg by mouth at bedtime.     metFORMIN 500 MG tablet  Commonly known as:  GLUCOPHAGE  Take 500 mg by mouth 3 (three) times daily.     metoprolol tartrate 25 MG tablet  Commonly known as:  LOPRESSOR  Take 12.5 mg by mouth daily.     metoprolol 50 MG tablet  Commonly known as:  LOPRESSOR  TAKE 0.5 TABLETS (25 MG TOTAL) BY MOUTH NIGHTLY.     tadalafil 5 MG tablet  Commonly known as:  CIALIS  Take 1 tablet (5 mg total) by mouth daily as needed for erectile dysfunction.     tamsulosin 0.4 MG Caps capsule  Commonly known as:  FLOMAX  Take 0.4 mg by mouth daily.     triamcinolone ointment 0.1 %  Commonly known as:  KENALOG  Apply 1 application topically 2 (two) times daily as needed.        Allergies:  Allergies  Allergen Reactions  . Tape Rash  . Latex Itching  .  Sulfa Antibiotics Nausea And Vomiting  . Zoster Vaccine Live Hives and Rash    localized Localized; Vaccine for shingles    Family History: History reviewed. No pertinent family history.  Social History:  reports that he has quit smoking. He has never used smokeless tobacco. He reports that he drinks alcohol. He reports that he does not use illicit drugs.   Physical Exam: BP 130/70 mmHg  Pulse 71  Ht 5\' 10"  (1.778 m)  Wt 221 lb 1.6 oz (100.29 kg)  BMI 31.72 kg/m2  Constitutional:  Alert and oriented, No acute distress. HEENT: [place] AT, moist mucus membranes.  Trachea midline, no masses. Cardiovascular: No clubbing, cyanosis, or edema. Respiratory: Normal respiratory effort, no increased work of breathing. GI: Abdomen is soft, nontender, nondistended, no abdominal masses GU: No CVA tenderness. Skin: No rashes, bruises or suspicious lesions. Neurologic: Grossly intact, no focal deficits, moving all 4 extremities. Psychiatric: Normal mood and affect.  Laboratory Data: Lab Results  Component Value Date   WBC 9.0 06/27/2014   HGB 15.6 06/27/2014   HCT 48.0 06/27/2014   MCV 85.8 06/27/2014   PLT 180 06/27/2014    Lab Results  Component Value Date   CREATININE 1.36* 06/27/2014    Imaging: RUS 08/20/14 CLINICAL DATA: Evaluate kidney stone  EXAM: RENAL / URINARY TRACT ULTRASOUND COMPLETE  COMPARISON: 05/07/2014  FINDINGS: Right Kidney:  Length: 12.6 cm. Echogenicity within normal limits. No mass or hydronephrosis visualized.  Left Kidney:  Length: 13.3 cm. Echogenicity within normal limits. No mass or hydronephrosis visualized.  Bladder:  The bladder wall appears thickened measuring up to 7 mm. Appears normal for degree of bladder distention.  Prostate gland enlargement noted.  IMPRESSION: 1. No evidence for kidney stone or hydronephrosis. 2. Prostate gland enlargement.   Electronically Signed  By: Kerby Moors M.D.  On: 08/20/2014  13:57  Results for orders placed or performed in visit on 08/21/14  BLADDER SCAN AMB NON-IMAGING  Result Value Ref Range   Scan Result 84      Assessment & Plan:  81 year old male status post left ureteroscopy, laser lithotripsy on 07/02/2014.  Follow up RUS without evidence of stones or hydronephrosis.  His voiding symptoms are easily controlled but does have a history of retention. PVR today is minimal.    1. Kidney stone on left side We discussed general stone prevention techniques including drinking plenty water with goal of producing 2.5 L urine daily, increased  citric acid intake, avoidance of high oxalate containing foods, and decreased salt intake.  Information about dietary recommendations given today.  - DG Abd 1 View; Future  2. BPH (benign prostatic hypertrophy) with urinary retention -Continue Flomax finasteride  - BLADDER SCAN AMB NON-IMAGING - tadalafil (CIALIS) 5 MG tablet; Take 1 tablet (5 mg total) by mouth daily as needed for erectile dysfunction.  Dispense: 30 tablet; Refill: 11  3. Other male erectile dysfunction Discussed the addition of Cialis 5 mg daily both for his erectile dysfunction as well as his voiding symptoms and history of urinary retention. He is given samples 1 month as well as a prescription today. He said good success with this in the past No contraindications. - tadalafil (CIALIS) 5 MG tablet; Take 1 tablet (5 mg total) by mouth daily as needed for erectile dysfunction.  Dispense: 30 tablet; Refill: 11    Return in about 1 year (around 08/21/2015) for KUB, PVR, IPSS.  Hollice Espy, MD  [facility] [address] [place], [place] [address] [phone]

## 2014-08-29 ENCOUNTER — Telehealth: Payer: Self-pay

## 2014-08-29 NOTE — Telephone Encounter (Signed)
PA for cialis 5mg  was approved, ref #[id number]. Effective dates are 07/30/14-08/29/15. Pt and pharmacy notified.

## 2014-10-16 ENCOUNTER — Ambulatory Visit: Payer: Medicare Other

## 2014-11-16 ENCOUNTER — Ambulatory Visit (INDEPENDENT_AMBULATORY_CARE_PROVIDER_SITE_OTHER): Payer: Medicare Other | Admitting: Sports Medicine

## 2014-11-16 ENCOUNTER — Encounter: Payer: Self-pay | Admitting: Sports Medicine

## 2014-11-16 DIAGNOSIS — L603 Nail dystrophy: Secondary | ICD-10-CM | POA: Diagnosis not present

## 2014-11-16 DIAGNOSIS — E119 Type 2 diabetes mellitus without complications: Secondary | ICD-10-CM | POA: Diagnosis not present

## 2014-11-16 DIAGNOSIS — M79674 Pain in right toe(s): Secondary | ICD-10-CM

## 2014-11-16 NOTE — Progress Notes (Signed)
Patient ID: Jared Ball, male   DOB: [birth year], 81 y.o.   MRN: [id number] Subjective: Jared Ball is a 81 y.o. Diabetic male patient seen today in office with complaint of painful right big toenail, unable to trim. Reports that a few days ago the inner corner of the right big toenail started to become painful; he treated the area with polysporin with relief; admits to previous PNA back in July to the right hallux both medial and lateral corners. Denies constitutional symptoms. Patient denies history of Neuropathy or Vascular disease. Patient has no other pedal complaints at this time.   FBS- 130mg /dl  Patient Active Problem List   Diagnosis Date Noted  . Chest pain 08/21/2014  . Abnormal echocardiogram 08/21/2014  . Normal radionuclide heart study 08/21/2014  . Adiposity 08/21/2014  . Beat, premature ventricular 08/21/2014  . Breath shortness 08/21/2014  . Diabetes mellitus type 2, uncontrolled ([place]) 08/21/2014  . Benign hypertension 10/10/2013  . Abnormal weight gain 06/23/2013  . Lumbar canal stenosis 06/21/2013  . Back ache 04/21/2013  . Bursitis of hip 04/21/2013  . Cough, persistent 12/29/2012  . Cough with expectoration 12/07/2012  . Sinus infection 12/07/2012  . Reflux 12/03/2011  . A-fib ([place]) 07/13/2011  . Abnormal prostate specific antigen 07/13/2011  . Hypercholesteremia 07/13/2011  . BP (high blood pressure) 07/13/2011  . LBP (low back pain) 07/13/2011  . Awareness of heartbeats 07/13/2011  . Cutaneous eruption 07/13/2011  . Apnea, sleep 07/13/2011  . Diabetes mellitus, type 2 ([place]) 07/13/2011   Current Outpatient Prescriptions on File Prior to Visit  Medication Sig Dispense Refill  . acetaminophen (TYLENOL) 500 MG tablet Take by mouth.    Marland Kitchen aspirin EC 81 MG tablet Take 81 mg by mouth daily.     . finasteride (PROSCAR) 5 MG tablet Take 5 mg by mouth daily.  4  . flecainide (TAMBOCOR) 100 MG tablet Take 100 mg by mouth 2 (two) times daily.  6  . glimepiride (AMARYL)  2 MG tablet Take 1 mg by mouth every other day.     . hydrochlorothiazide (MICROZIDE) 12.5 MG capsule Take 12.5 mg by mouth daily.   5  . HYDROcodone-acetaminophen (NORCO/VICODIN) 5-325 MG per tablet Take 1-2 tablets by mouth every 6 (six) hours as needed for moderate pain. 15 tablet 0  . lovastatin (MEVACOR) 40 MG tablet Take 40 mg by mouth at bedtime.     . metFORMIN (GLUCOPHAGE) 500 MG tablet Take 500 mg by mouth 3 (three) times daily.  3  . metoprolol (LOPRESSOR) 50 MG tablet TAKE 0.5 TABLETS (25 MG TOTAL) BY MOUTH NIGHTLY.  2  . metoprolol tartrate (LOPRESSOR) 25 MG tablet Take 12.5 mg by mouth daily.    . pimecrolimus (ELIDEL) 1 % cream Apply 1 application topically 2 (two) times daily as needed.    . tadalafil (CIALIS) 5 MG tablet Take 1 tablet (5 mg total) by mouth daily as needed for erectile dysfunction. 30 tablet 11  . tamsulosin (FLOMAX) 0.4 MG CAPS capsule Take 0.4 mg by mouth daily.     Marland Kitchen triamcinolone ointment (KENALOG) 0.1 % Apply 1 application topically 2 (two) times daily as needed.   0   No current facility-administered medications on file prior to visit.   Allergies  Allergen Reactions  . Tape Rash  . Latex Itching  . Sulfa Antibiotics Nausea And Vomiting  . Zoster Vaccine Live Hives and Rash    localized Localized; Vaccine for shingles localized Localized; Vaccine for shingles    Objective:  Physical Exam  General: Well developed, nourished, no acute distress, awake, alert and oriented x 3  Vascular: Dorsalis pedis artery 2/4 bilateral, Posterior tibial artery 1/4 bilateral, skin temperature warm to warm proximal to distal bilateral lower extremities, no varicosities, pedal hair present bilateral.  Neurological: Gross sensation present via light touch bilateral. Protective sensation intact with SWMF. Vibratory diminished to level of ankles bilateral.  Dermatological: Skin is warm, dry, and supple bilateral,Right hallux medial and lateral border with mild  subungal debris and dry skin with significant distal nail curvature, no acute reoccurrence of ingrown, no signs of infection, all other nails within normal limits , no webspace macerations present bilateral, no open lesions present bilateral, no callus/corns/hyperkeratotic tissue present bilateral.  Musculoskeletal: No boney deformities noted bilateral. Muscular strength within normal limits without pain or limitation on range of motion. No pain with calf compression bilateral.  Assessment and Plan:  Problem List Items Addressed This Visit      Endocrine   Diabetes mellitus, type 2 ([place])    Other Visit Diagnoses    Nail dystrophy    -  Primary    Right Hallux medail and lateral nail borders; history of previous PNA     Pain in toe of right foot          -Examined patient.  -Stressed the importance of glycemic control and daily monitoring and foot inspection.  -Discussed treatment options for dystrophic nail(s) -Mechanically debrided and curretted right hallux medial and lateral nail border with sterile instruments without incident. Applied topical antibiotic and bandaid. Advise to monitor for reoccurence or signs of infection.  -Patient to return as needed for follow up evaluation or sooner if symptoms worsen.  Landis Martins, DPM

## 2015-02-14 ENCOUNTER — Ambulatory Visit (INDEPENDENT_AMBULATORY_CARE_PROVIDER_SITE_OTHER): Payer: Medicare Other | Admitting: Podiatry

## 2015-02-14 ENCOUNTER — Encounter: Payer: Self-pay | Admitting: Podiatry

## 2015-02-14 VITALS — BP 133/68 | HR 69 | Resp 16

## 2015-02-14 DIAGNOSIS — E119 Type 2 diabetes mellitus without complications: Secondary | ICD-10-CM | POA: Diagnosis not present

## 2015-02-14 DIAGNOSIS — L603 Nail dystrophy: Secondary | ICD-10-CM

## 2015-02-14 NOTE — Progress Notes (Signed)
Patient ID: Jared Ball, male   DOB: 1932/06/10, 82 y.o.   MRN: [id number]   Subjective: 82 year old male presents the office today for concerns of his right big toenail becoming thick and painful. He states the areas very painful last week. He is applying Neosporin over on the area and the pain did resolve. She denies any redness or swelling or any drainage at this time. He states the nail does become tender at the tip of the toe at times with socks and gets caught. No recent injury or trauma. No other complaints.  Objective: AAO 3, NAD DP/PT pulses 2/4, CRT less than 3 seconds Right hallux toenails  Is significantly hypertrophic and dystrophic and discolored. There is some hyperkeratotic tissue within the nail borders from the previous nail procedure site. Upon debridement there is no drainage or pus. There is no surrounding edema, erythema, increase in warmth. No clinical signs of infection. No tenderness the remaining toenails. No open lesions or pre-ulcer lesions identified at this time. No pain with calf compression, swelling, warmth, erythema.  Assessment: 82 year old male right hallux onychodystrophy  Plan: -Treatment options discussed including all alternatives, risks, and complications -Etiology of symptoms were discussed -Discussed with him treatment options including nail removal versus nail debridement. He elects for debridement. The nails mechanically debrided without complications or bleeding. Continue to monitor for any recurrence. -Follow-up in 3 months or sooner if any problems arise. In the meantime, encouraged to call the office with any questions, concerns, change in symptoms.   Celesta Gentile, DPM

## 2015-05-14 ENCOUNTER — Encounter: Payer: Self-pay | Admitting: Podiatry

## 2015-05-14 ENCOUNTER — Ambulatory Visit (INDEPENDENT_AMBULATORY_CARE_PROVIDER_SITE_OTHER): Payer: Medicare Other | Admitting: Podiatry

## 2015-05-14 DIAGNOSIS — L600 Ingrowing nail: Secondary | ICD-10-CM

## 2015-05-14 DIAGNOSIS — E119 Type 2 diabetes mellitus without complications: Secondary | ICD-10-CM

## 2015-05-14 DIAGNOSIS — M79674 Pain in right toe(s): Secondary | ICD-10-CM | POA: Diagnosis not present

## 2015-05-14 DIAGNOSIS — B351 Tinea unguium: Secondary | ICD-10-CM

## 2015-05-14 NOTE — Progress Notes (Signed)
Patient ID: Jared Ball, male   DOB: 28-Jul-1932, 82 y.o.   MRN: [id number]  Subjective: 82 y.o. returns the office today for painful, elongated, thickened toenails which he cannot trim himself. He states the right big toenail started to become more painful over the last week but denies any redness or drainage. Denies any redness or drainage around the nails. Denies any acute changes since last appointment and no new complaints today. Denies any systemic complaints such as fevers, chills, nausea, vomiting.   Objective: AAO 3, NAD DP/PT pulses palpable, CRT less than 3 seconds.  Nails hypertrophic, dystrophic, elongated, brittle, discolored 10. There is tenderness overlying the nails 1-5 bilaterally. The right hallux toenail is incurvated.There is no surrounding erythema or drainage along the nail sites. No open lesions or pre-ulcerative lesions are identified. No other areas of tenderness bilateral lower extremities. No overlying edema, erythema, increased warmth. No pain with calf compression, swelling, warmth, erythema.  Assessment: Patient presents with symptomatic onychomycosis; right hallux ingrown toenail  Plan: -Treatment options including alternatives, risks, complications were discussed -Nails sharply debrided 80 without complication/bleeding. -Discussed daily foot inspection. If there are any changes, to call the office immediately.  -Follow-up in 3 months or sooner if any problems are to arise. In the meantime, encouraged to call the office with any questions, concerns, changes symptoms.  Celesta Gentile, DPM

## 2015-06-25 ENCOUNTER — Encounter: Payer: Self-pay | Admitting: Urology

## 2015-06-25 ENCOUNTER — Ambulatory Visit (INDEPENDENT_AMBULATORY_CARE_PROVIDER_SITE_OTHER): Payer: Medicare Other | Admitting: Urology

## 2015-06-25 VITALS — BP 148/73 | HR 84 | Ht 69.0 in | Wt 236.5 lb

## 2015-06-25 DIAGNOSIS — Z87442 Personal history of urinary calculi: Secondary | ICD-10-CM

## 2015-06-25 DIAGNOSIS — N401 Enlarged prostate with lower urinary tract symptoms: Secondary | ICD-10-CM | POA: Diagnosis not present

## 2015-06-25 DIAGNOSIS — N528 Other male erectile dysfunction: Secondary | ICD-10-CM | POA: Diagnosis not present

## 2015-06-25 DIAGNOSIS — Z87898 Personal history of other specified conditions: Secondary | ICD-10-CM

## 2015-06-25 DIAGNOSIS — Z87448 Personal history of other diseases of urinary system: Secondary | ICD-10-CM | POA: Diagnosis not present

## 2015-06-25 DIAGNOSIS — N138 Other obstructive and reflux uropathy: Secondary | ICD-10-CM

## 2015-06-25 DIAGNOSIS — R300 Dysuria: Secondary | ICD-10-CM

## 2015-06-25 LAB — BLADDER SCAN AMB NON-IMAGING: Scan Result: 72

## 2015-06-25 NOTE — Progress Notes (Signed)
06/25/2015 3:28 PM   Jared Ball 21-Aug-1932 [id number]  Referring provider: Kirk Ruths, MD [place], [place] [address]  Chief Complaint  Patient presents with  . Dysuria    patient states burning upon urination    HPI: Patient is an 82 year old Caucasian male who presents today complaining of burning with urination that began five days ago.    He describes the dysuria as happening on an intermittent basis.  He has not had gross hematuria or suprapubic pain.  He denies fevers, chills, nausea or vomiting.  He has also been experiencing urgency, nocturia and intermittency.  He has been without his Cialis 5 mg daily for several weeks.  His UA today is unremarkable.  His PVR is 72 mL.    We had last seen the patient in 08/2014 for 2 non obstructing stones, measuring up to 11 mm for which he underwent a left URS/LL/ureteral sent and stent removal.  He did experience postoperative retention   He has an upcoming follow up appointment in August.  He has not had any passage of fragments or flank pain.       PMH: Past Medical History  Diagnosis Date  . Dysrhythmia   . Hypertension   . Diabetes mellitus without complication ([place])   . Anxiety   . Chronic kidney disease   . GERD (gastroesophageal reflux disease)   . Headache   . Arthritis   . Spinal stenosis   . Shortness of breath dyspnea     Surgical History: Past Surgical History  Procedure Laterality Date  . Tonsillectomy    . Colonoscopy    . Ureteroscopy with holmium laser lithotripsy Left 07/02/2014    Procedure: URETEROSCOPY WITH HOLMIUM LASER LITHOTRIPSY;  Surgeon: Hollice Espy, MD;  Location: [facility] ORS;  Service: Urology;  Laterality: Left;  . Cystoscopy with stent placement Left 07/02/2014    Procedure: CYSTOSCOPY WITH STENT PLACEMENT;  Surgeon: Hollice Espy, MD;  Location: [facility] OR[facility];  Service: Urology;  Laterality: Left;  . Cystoscopy w/ retrogrades Bilateral  07/02/2014    Procedure: CYSTOSCOPY WITH RETROGRADE PYELOGRAM;  Surgeon: Hollice Espy, MD;  Location: [facility] ORS;  Service: Urology;  Laterality: Bilateral;    Home Medications:    Medication List       This list is accurate as of: 06/25/15  3:28 PM.  Always use your most recent med list.               acetaminophen 500 MG tablet  Commonly known as:  TYLENOL  Take by mouth.     aspirin EC 81 MG tablet  Take 81 mg by mouth daily.     azelastine 0.1 % nasal spray  Commonly known as:  ASTELIN  Place into the nose.     BAYER CONTOUR TEST test strip  Generic drug:  glucose blood     ELIDEL 1 % cream  Generic drug:  pimecrolimus  Apply 1 application topically 2 (two) times daily as needed. Reported on 06/25/2015     finasteride 5 MG tablet  Commonly known as:  PROSCAR  Take 5 mg by mouth daily.     flecainide 100 MG tablet  Commonly known as:  TAMBOCOR  Take 100 mg by mouth 2 (two) times daily.     fluticasone 50 MCG/ACT nasal spray  Commonly known as:  FLONASE  Place into the nose. Reported on 06/25/2015     furosemide 20 MG tablet  Commonly known as:  LASIX  Take 20 mg by mouth daily. Reported on 06/25/2015     glimepiride 2 MG tablet  Commonly known as:  AMARYL  Take 1 mg by mouth every other day.     GLUCOSAMINE CHOND COMPLEX/MSM Tabs  Reported on 06/25/2015     hydrochlorothiazide 12.5 MG capsule  Commonly known as:  MICROZIDE  Take 12.5 mg by mouth daily. Reported on 06/25/2015     HYDROcodone-acetaminophen 5-325 MG tablet  Commonly known as:  NORCO/VICODIN  Take 1-2 tablets by mouth every 6 (six) hours as needed for moderate pain.     loperamide 2 MG tablet  Commonly known as:  IMODIUM A-D  Take by mouth.     lovastatin 40 MG tablet  Commonly known as:  MEVACOR  Take 40 mg by mouth at bedtime.     metFORMIN 500 MG tablet  Commonly known as:  GLUCOPHAGE  Take 500 mg by mouth 3 (three) times daily.     methocarbamol 500 MG tablet  Commonly known  as:  ROBAXIN  Take by mouth. Reported on 06/25/2015     metoprolol tartrate 25 MG tablet  Commonly known as:  LOPRESSOR  Take 12.5 mg by mouth daily. Reported on 06/25/2015     metoprolol 50 MG tablet  Commonly known as:  LOPRESSOR  TAKE 0.5 TABLETS (25 MG TOTAL) BY MOUTH NIGHTLY.     potassium chloride 10 MEQ tablet  Commonly known as:  K-DUR  Reported on 06/25/2015     ranitidine 75 MG tablet  Commonly known as:  ZANTAC  Take by mouth. Reported on 06/25/2015     [facility] GLUCOSE 4 GM chewable tablet  Generic drug:  glucose  Reported on 06/25/2015     tadalafil 5 MG tablet  Commonly known as:  CIALIS  Take 1 tablet (5 mg total) by mouth daily as needed for erectile dysfunction.     tamsulosin 0.4 MG Caps capsule  Commonly known as:  FLOMAX  Take 0.4 mg by mouth daily.     torsemide 5 MG tablet  Commonly known as:  DEMADEX  Take by mouth.     triamcinolone ointment 0.1 %  Commonly known as:  KENALOG  Apply 1 application topically 2 (two) times daily as needed.        Allergies:  Allergies  Allergen Reactions  . Tape Rash  . Latex Itching  . Sulfa Antibiotics Nausea And Vomiting  . Zoster Vaccine Live Hives and Rash    localized Localized; Vaccine for shingles localized Localized; Vaccine for shingles    Family History: Family History  Problem Relation Age of Onset  . Kidney disease Mother   . Prostate cancer Neg Hx     Social History:  reports that he has quit smoking. He has never used smokeless tobacco. He reports that he drinks alcohol. He reports that he does not use illicit drugs.  ROS: UROLOGY Frequent Urination?: No Hard to postpone urination?: Yes Burning/pain with urination?: Yes Get up at night to urinate?: Yes Leakage of urine?: No Urine stream starts and stops?: Yes Trouble starting stream?: No Do you have to strain to urinate?: No Blood in urine?: No Urinary tract infection?: No Sexually transmitted disease?: No Injury to kidneys or  bladder?: No Painful intercourse?: No Weak stream?: Yes Erection problems?: Yes Penile pain?: No  Gastrointestinal Nausea?: No Vomiting?: No Indigestion/heartburn?: No Diarrhea?: No Constipation?: No  Constitutional Fever: No Night sweats?: No Weight loss?: No Fatigue?: No  Skin Skin rash/lesions?: No Itching?: No  Eyes Blurred vision?: No Double vision?: No  Ears/Nose/Throat Sore throat?: No Sinus problems?: No  Hematologic/Lymphatic Swollen glands?: No Easy bruising?: No  Cardiovascular Leg swelling?: Yes Chest pain?: No  Respiratory Cough?: No Shortness of breath?: Yes  Endocrine Excessive thirst?: No  Musculoskeletal Back pain?: No Joint pain?: No  Neurological Headaches?: Yes Dizziness?: No  Psychologic Depression?: No Anxiety?: Yes  Physical Exam: BP 148/73 mmHg  Pulse 84  Ht 5\' 9"  (1.753 m)  Wt 236 lb 8 oz (107.276 kg)  BMI 34.91 kg/m2  Constitutional: Well nourished. Alert and oriented, No acute distress. HEENT: [place] AT, moist mucus membranes. Trachea midline, no masses. Cardiovascular: No clubbing, cyanosis, or edema. Respiratory: Normal respiratory effort, no increased work of breathing. GI: Abdomen is soft, non tender, non distended, no abdominal masses. Liver and spleen not palpable.  No hernias appreciated.  Stool sample for occult testing is not indicated.   GU: No CVA tenderness.  No bladder fullness or masses.  Patient with uncircumcised phallus. Foreskin easily retracted Urethral meatus is patent.  No penile discharge. No penile lesions or rashes. Scrotum without lesions, cysts, rashes and/or edema.  Testicles are located scrotally bilaterally. No masses are appreciated in the testicles. Left and right epididymis are normal. Rectal: Patient with  normal sphincter tone. Anus and perineum without scarring or rashes. No rectal masses are appreciated. Prostate is approximately 60 grams, irregular, no nodules are appreciated. Seminal  vesicles are normal. Skin: No rashes, bruises or suspicious lesions. Lymph: No cervical or inguinal adenopathy. Neurologic: Grossly intact, no focal deficits, moving all 4 extremities. Psychiatric: Normal mood and affect.  Laboratory Data: Lab Results  Component Value Date   WBC 9.0 06/27/2014   HGB 15.6 06/27/2014   HCT 48.0 06/27/2014   MCV 85.8 06/27/2014   PLT 180 06/27/2014    Lab Results  Component Value Date   CREATININE 1.36* 06/27/2014    Urinalysis Results for orders placed or performed in visit on 06/25/15  CULTURE, URINE COMPREHENSIVE  Result Value Ref Range   Urine Culture, Comprehensive Final report    Result 1 Comment   Microscopic Examination  Result Value Ref Range   WBC, UA 0-5 0 -  5 /hpf   RBC, UA None seen 0 -  2 /hpf   Epithelial Cells (non renal) None seen 0 - 10 /hpf   Mucus, UA Present (A) Not Estab.   Bacteria, UA None seen None seen/Few  Urinalysis, Complete  Result Value Ref Range   Specific Gravity, UA 1.015 1.005 - 1.030   pH, UA 5.5 5.0 - 7.5   Color, UA Yellow Yellow   Appearance Ur Clear Clear   Leukocytes, UA Negative Negative   Protein, UA Negative Negative/Trace   Glucose, UA Negative Negative   Ketones, UA Negative Negative   RBC, UA Negative Negative   Bilirubin, UA Negative Negative   Urobilinogen, Ur 0.2 0.2 - 1.0 mg/dL   Nitrite, UA Negative Negative   Microscopic Examination See below:   BLADDER SCAN AMB NON-IMAGING  Result Value Ref Range   Scan Result 72     Pertinent Imaging: Results for MAKARIOS, VLIET (MRN [id number]) as of 06/28/2015 08:10  Ref. Range 06/25/2015 15:14  Scan Result Unknown 72    Assessment & Plan:    1. Dysuria:  UA is clear.  Doubtful there is infection.  Will send for culture for completeness.    - Urinalysis, Complete - CULTURE, URINE COMPREHENSIVE  2.  BPH with LUTS:   Patient has been without his Cialis.  He will continue his Flomax and finasteride.  I will restart the Cialis 5 mg daily.   I have given him 30-day samples.  He will contact the office if he sees improvement to renew the prescription or if his symptoms do not improve.  - BLADDER SCAN AMB NON-IMAGING  3.  History of nephrolithiasis:   Patient will be returning in August with KUB for an office visit.    4. Erectile dysfunction:   Patient is given Cialis 5 mg samples today.  5. History of urinary retention:   PVR is minimal.  Continue finasteride, tamsulosin and restarted Cialis 5 mg daily.   Return in about 1 month (around 07/25/2015) for keep appointment with Dr. Erlene Quan on 08/11.  These notes generated with voice recognition software. I apologize for typographical errors.  Zara Council, [facility] [address], [place], [place] [address] [phone]

## 2015-06-26 LAB — URINALYSIS, COMPLETE
Bilirubin, UA: NEGATIVE
Glucose, UA: NEGATIVE
Ketones, UA: NEGATIVE
Leukocytes, UA: NEGATIVE
Nitrite, UA: NEGATIVE
Protein, UA: NEGATIVE
RBC, UA: NEGATIVE
Specific Gravity, UA: 1.015 (ref 1.005–1.030)
Urobilinogen, Ur: 0.2 mg/dL (ref 0.2–1.0)
pH, UA: 5.5 (ref 5.0–7.5)

## 2015-06-26 LAB — MICROSCOPIC EXAMINATION
Bacteria, UA: NONE SEEN
Epithelial Cells (non renal): NONE SEEN /hpf (ref 0–10)
RBC, UA: NONE SEEN /hpf (ref 0–?)

## 2015-06-27 LAB — CULTURE, URINE COMPREHENSIVE

## 2015-08-15 ENCOUNTER — Ambulatory Visit (INDEPENDENT_AMBULATORY_CARE_PROVIDER_SITE_OTHER): Payer: Medicare Other | Admitting: Podiatry

## 2015-08-15 ENCOUNTER — Encounter: Payer: Self-pay | Admitting: Podiatry

## 2015-08-15 DIAGNOSIS — M79674 Pain in right toe(s): Secondary | ICD-10-CM | POA: Diagnosis not present

## 2015-08-15 DIAGNOSIS — B351 Tinea unguium: Secondary | ICD-10-CM

## 2015-08-15 DIAGNOSIS — E119 Type 2 diabetes mellitus without complications: Secondary | ICD-10-CM

## 2015-08-18 NOTE — Progress Notes (Signed)
Patient ID: Jared Ball, male   DOB: 13-Jun-1932, 82 y.o.   MRN: [id number]  Subjective: 82 y.o. returns the office today for painful, elongated, thickened toenails which he cannot trim himself.  Denies any redness or drainage around the nails. Denies any acute changes since last appointment and no new complaints today. Denies any systemic complaints such as fevers, chills, nausea, vomiting.   Objective: AAO 3, NAD DP/PT pulses palpable, CRT less than 3 seconds.  Nails hypertrophic, dystrophic, elongated, brittle, discolored 10. There is tenderness overlying the nails 1-5 bilaterally. There is no surrounding erythema or drainage along the nail sites. No open lesions or pre-ulcerative lesions are identified. No other areas of tenderness bilateral lower extremities. No overlying edema, erythema, increased warmth. No pain with calf compression, swelling, warmth, erythema.  Assessment: Patient presents with symptomatic onychomycosis  Plan: -Treatment options including alternatives, risks, complications were discussed -Nails sharply debrided 10 without complication/bleeding. -Discussed daily foot inspection. If there are any changes, to call the office immediately.  -Follow-up in 3 months or sooner if any problems are to arise. In the meantime, encouraged to call the office with any questions, concerns, changes symptoms.  Celesta Gentile, DPM

## 2015-08-23 ENCOUNTER — Ambulatory Visit: Payer: Medicare Other | Admitting: Urology

## 2015-08-23 ENCOUNTER — Telehealth: Payer: Self-pay | Admitting: Urology

## 2015-08-23 DIAGNOSIS — N401 Enlarged prostate with lower urinary tract symptoms: Secondary | ICD-10-CM

## 2015-08-23 DIAGNOSIS — N528 Other male erectile dysfunction: Secondary | ICD-10-CM

## 2015-08-23 DIAGNOSIS — R338 Other retention of urine: Principal | ICD-10-CM

## 2015-08-23 MED ORDER — TADALAFIL 5 MG PO TABS: 5.0000 mg | ORAL_TABLET | Freq: Every day | ORAL | 11 refills | Status: DC | PRN

## 2015-08-23 NOTE — Telephone Encounter (Signed)
Pt would like a prescription called in for Cialis 5 mg.  He used some samples you gave him and they worked well.  He has an appt on 8/22.  [facility].

## 2015-08-23 NOTE — Telephone Encounter (Signed)
That is fine to send a prescription for Cialis 5 mg daily for the patient.

## 2015-08-23 NOTE — Telephone Encounter (Signed)
Medication sent to pt pharmacy 

## 2015-08-30 ENCOUNTER — Ambulatory Visit
Admission: RE | Admit: 2015-08-30 | Discharge: 2015-08-30 | Disposition: A | Discharge status: Home / Self Care | Payer: Medicare Other | Source: Ambulatory Visit | Attending: Urology | Admitting: Urology

## 2015-08-30 DIAGNOSIS — Z87442 Personal history of urinary calculi: Secondary | ICD-10-CM | POA: Diagnosis present

## 2015-08-30 DIAGNOSIS — N200 Calculus of kidney: Secondary | ICD-10-CM

## 2015-09-03 ENCOUNTER — Encounter: Payer: Self-pay | Admitting: Urology

## 2015-09-03 ENCOUNTER — Ambulatory Visit (INDEPENDENT_AMBULATORY_CARE_PROVIDER_SITE_OTHER): Payer: Medicare Other | Admitting: Urology

## 2015-09-03 VITALS — BP 131/76 | HR 76 | Ht 69.0 in | Wt 240.1 lb

## 2015-09-03 DIAGNOSIS — N528 Other male erectile dysfunction: Secondary | ICD-10-CM

## 2015-09-03 DIAGNOSIS — N200 Calculus of kidney: Secondary | ICD-10-CM

## 2015-09-03 DIAGNOSIS — R338 Other retention of urine: Principal | ICD-10-CM

## 2015-09-03 DIAGNOSIS — N400 Enlarged prostate without lower urinary tract symptoms: Secondary | ICD-10-CM | POA: Diagnosis not present

## 2015-09-03 DIAGNOSIS — E119 Type 2 diabetes mellitus without complications: Secondary | ICD-10-CM | POA: Insufficient documentation

## 2015-09-03 DIAGNOSIS — N401 Enlarged prostate with lower urinary tract symptoms: Secondary | ICD-10-CM

## 2015-09-03 LAB — BLADDER SCAN AMB NON-IMAGING: Scan Result: 20

## 2015-09-03 MED ORDER — TADALAFIL 5 MG PO TABS: 5.0000 mg | ORAL_TABLET | Freq: Every day | ORAL | 12 refills | Status: DC | PRN

## 2015-09-03 MED ORDER — FINASTERIDE 5 MG PO TABS: 5.0000 mg | ORAL_TABLET | Freq: Every day | ORAL | 12 refills | Status: DC

## 2015-09-03 NOTE — Progress Notes (Signed)
09/03/2015 2:50 PM   Jared Ball 07-18-1932 [id number]  Referring provider: Kirk Ruths, MD [place], [place] [address]  Chief Complaint  Patient presents with  . Follow-up    kidney stones    HPI: Patient is an 82 year old Caucasian male who presents today for a follow up for BPH with LUTS and a history of nephrolithiasis.  BPH WITH LUTS His IPSS score today is 20, which is moderate lower urinary tract symptomatology. He is mixed with his quality life due to his urinary symptoms.  His major complaint today a weak urinary stream, intermittency and leakage.  He has had these symptoms for several years.  He denies any dysuria, hematuria or suprapubic pain.  He currently taking Cialis 5 mg daily and finasteride 5 mg daily.   He also denies any recent fevers, chills, nausea or vomiting.  He does not have a family history of PCa.      IPSS    Row Name 09/03/15 1400         International Prostate Symptom Score   How often have you had the sensation of not emptying your bladder? More than half the time     How often have you had to urinate less than every two hours? About half the time     How often have you found you stopped and started again several times when you urinated? More than half the time     How often have you found it difficult to postpone urination? About half the time     How often have you had a weak urinary stream? About half the time     How often have you had to strain to start urination? Less than half the time     How many times did you typically get up at night to urinate? 1 Time     Total IPSS Score 20       Quality of Life due to urinary symptoms   If you were to spend the rest of your life with your urinary condition just the way it is now how would you feel about that? Mixed        Score:  1-7 Mild 8-19 Moderate 20-35 Severe    History of nephrolithiasis We had last seen the patient in 08/2014  for 2 non obstructing stones, measuring up to 11 mm for which he underwent a left URS/LL/ureteral sent and stent removal.  He did experience postoperative retention   KUB taken today does not demonstrate any urinary stones.  He has not had any passage of fragments or flank pain.  He has not had blood in his urine.   PMH: Past Medical History:  Diagnosis Date  . Anxiety   . Arthritis   . Chronic kidney disease   . Diabetes mellitus without complication ([place])   . Dysrhythmia   . GERD (gastroesophageal reflux disease)   . Headache   . Hypertension   . Shortness of breath dyspnea   . Spinal stenosis     Surgical History: Past Surgical History:  Procedure Laterality Date  . COLONOSCOPY    . CYSTOSCOPY W/ RETROGRADES Bilateral 07/02/2014   Procedure: CYSTOSCOPY WITH RETROGRADE PYELOGRAM;  Surgeon: Hollice Espy, MD;  Location: [facility];  Service: Urology;  Laterality: Bilateral;  . CYSTOSCOPY WITH STENT PLACEMENT Left 07/02/2014   Procedure: CYSTOSCOPY WITH STENT PLACEMENT;  Surgeon: Hollice Espy, MD;  Location: [facility];  Service:  Urology;  Laterality: Left;  . TONSILLECTOMY    . URETEROSCOPY WITH HOLMIUM LASER LITHOTRIPSY Left 07/02/2014   Procedure: URETEROSCOPY WITH HOLMIUM LASER LITHOTRIPSY;  Surgeon: Hollice Espy, MD;  Location: [facility];  Service: Urology;  Laterality: Left;    Home Medications:    Medication List       Accurate as of 09/03/15  2:50 PM. Always use your most recent med list.          acetaminophen 500 MG tablet Commonly known as:  TYLENOL Take by mouth.   aspirin EC 81 MG tablet Take 81 mg by mouth daily.   azelastine 0.1 % nasal spray Commonly known as:  ASTELIN Place into the nose.   BAYER CONTOUR TEST test strip Generic drug:  glucose blood   ELIDEL 1 % cream Generic drug:  pimecrolimus Apply 1 application topically 2 (two) times daily as needed. Reported on 06/25/2015   finasteride 5 MG tablet Commonly known as:  PROSCAR Take 1 tablet  (5 mg total) by mouth daily.   flecainide 100 MG tablet Commonly known as:  TAMBOCOR Take 100 mg by mouth 2 (two) times daily.   fluticasone 50 MCG/ACT nasal spray Commonly known as:  FLONASE Place into the nose. Reported on 06/25/2015   furosemide 20 MG tablet Commonly known as:  LASIX Take 20 mg by mouth daily. Reported on 06/25/2015   glimepiride 2 MG tablet Commonly known as:  AMARYL Take 1 mg by mouth every other day.   GLUCOSAMINE CHOND COMPLEX/MSM Tabs Reported on 06/25/2015   hydrochlorothiazide 12.5 MG capsule Commonly known as:  MICROZIDE Take 12.5 mg by mouth daily. Reported on 06/25/2015   HYDROcodone-acetaminophen 5-325 MG tablet Commonly known as:  NORCO/VICODIN Take 1-2 tablets by mouth every 6 (six) hours as needed for moderate pain.   loperamide 2 MG tablet Commonly known as:  IMODIUM A-D Take by mouth.   lovastatin 40 MG tablet Commonly known as:  MEVACOR Take 40 mg by mouth at bedtime.   metFORMIN 500 MG tablet Commonly known as:  GLUCOPHAGE Take 500 mg by mouth 3 (three) times daily.   methocarbamol 500 MG tablet Commonly known as:  ROBAXIN Take by mouth. Reported on 06/25/2015   metoprolol tartrate 25 MG tablet Commonly known as:  LOPRESSOR Take 12.5 mg by mouth daily. Reported on 06/25/2015   metoprolol 50 MG tablet Commonly known as:  LOPRESSOR TAKE 0.5 TABLETS (25 MG TOTAL) BY MOUTH NIGHTLY.   potassium chloride 10 MEQ tablet Commonly known as:  K-DUR Reported on 06/25/2015   ranitidine 75 MG tablet Commonly known as:  ZANTAC Take by mouth. Reported on 06/25/2015   [facility] GLUCOSE 4 GM chewable tablet Generic drug:  glucose Reported on 06/25/2015   tadalafil 5 MG tablet Commonly known as:  CIALIS Take 1 tablet (5 mg total) by mouth daily as needed for erectile dysfunction.   tamsulosin 0.4 MG Caps capsule Commonly known as:  FLOMAX Take 0.4 mg by mouth daily.   torsemide 5 MG tablet Commonly known as:  DEMADEX Take by mouth.     triamcinolone ointment 0.1 % Commonly known as:  KENALOG Apply 1 application topically 2 (two) times daily as needed.       Allergies:  Allergies  Allergen Reactions  . Tape Rash  . Latex Itching  . Sulfa Antibiotics Nausea And Vomiting  . Zoster Vaccine Live Hives and Rash    localized Localized; Vaccine for shingles localized Localized; Vaccine for shingles    Family History:  Family History  Problem Relation Age of Onset  . Kidney disease Mother   . Prostate cancer Neg Hx     Social History:  reports that he has quit smoking. He has never used smokeless tobacco. He reports that he drinks alcohol. He reports that he does not use drugs.  ROS: UROLOGY Frequent Urination?: No Hard to postpone urination?: No Burning/pain with urination?: No Get up at night to urinate?: No Leakage of urine?: Yes Urine stream starts and stops?: Yes Trouble starting stream?: No Do you have to strain to urinate?: No Blood in urine?: No Urinary tract infection?: No Sexually transmitted disease?: No Injury to kidneys or bladder?: No Painful intercourse?: No Weak stream?: Yes Erection problems?: Yes Penile pain?: No  Gastrointestinal Nausea?: No Vomiting?: No Indigestion/heartburn?: No Diarrhea?: No Constipation?: No  Constitutional Fever: No Night sweats?: No Weight loss?: No Fatigue?: No  Skin Skin rash/lesions?: No Itching?: No  Eyes Blurred vision?: No Double vision?: No  Ears/Nose/Throat Sore throat?: No Sinus problems?: Yes  Hematologic/Lymphatic Swollen glands?: No Easy bruising?: No  Cardiovascular Leg swelling?: Yes Chest pain?: No  Respiratory Cough?: No Shortness of breath?: Yes  Endocrine Excessive thirst?: No  Musculoskeletal Back pain?: No Joint pain?: No  Neurological Headaches?: No Dizziness?: No  Psychologic Depression?: No Anxiety?: Yes  Physical Exam: BP 131/76   Pulse 76   Ht 5\' 9"  (1.753 m)   Wt 240 lb 1.6 oz (108.9  kg)   BMI 35.46 kg/m   Constitutional: Well nourished. Alert and oriented, No acute distress. HEENT: [place] AT, moist mucus membranes. Trachea midline, no masses. Cardiovascular: No clubbing, cyanosis, or edema. Respiratory: Normal respiratory effort, no increased work of breathing. GI: Abdomen is soft, non tender, non distended, no abdominal masses. Liver and spleen not palpable.  No hernias appreciated.  Stool sample for occult testing is not indicated.   GU: No CVA tenderness.  No bladder fullness or masses.  Patient with uncircumcised phallus. Foreskin easily retracted Urethral meatus is patent.  No penile discharge. No penile lesions or rashes. Scrotum without lesions, cysts, rashes and/or edema.  Testicles are located scrotally bilaterally. No masses are appreciated in the testicles. Left and right epididymis are normal. Rectal: Patient with  normal sphincter tone. Anus and perineum without scarring or rashes. No rectal masses are appreciated. Prostate is approximately 60 grams, irregular, no nodules are appreciated. Seminal vesicles are normal. Skin: No rashes, bruises or suspicious lesions. Lymph: No cervical or inguinal adenopathy. Neurologic: Grossly intact, no focal deficits, moving all 4 extremities. Psychiatric: Normal mood and affect.  Laboratory Data: Lab Results  Component Value Date   WBC 9.0 06/27/2014   HGB 15.6 06/27/2014   HCT 48.0 06/27/2014   MCV 85.8 06/27/2014   PLT 180 06/27/2014    Lab Results  Component Value Date   CREATININE 1.36 (H) 06/27/2014    Pertinent Imaging: Results for orders placed or performed in visit on 09/03/15  Bladder Scan (Post Void Residual) in office  Result Value Ref Range   Scan Result 20      Assessment & Plan:    1. BPH with LUTS  - IPSS score is 20/3  - Continue conservative management, avoiding bladder irritants and timed voiding's  - Continue Cialis 5 mg daily and finasteride 5 mg daily  - RTC in 12 months for IPSS, PSA  and exam   - BLADDER SCAN AMB NON-IMAGING  2. History of nephrolithiasis:   KUB did not demonstrate any urinary stones.  KUB  in one year.  3. History of urinary retention:   Resolved.     Return in about 1 year (around 09/02/2016) for IPSS, KUB and exam.  These notes generated with voice recognition software. I apologize for typographical errors.  Zara Council, [facility] [address] [place],  [address] [phone]

## 2015-11-15 ENCOUNTER — Ambulatory Visit (INDEPENDENT_AMBULATORY_CARE_PROVIDER_SITE_OTHER): Payer: Medicare Other | Admitting: Podiatry

## 2015-11-15 DIAGNOSIS — M79674 Pain in right toe(s): Secondary | ICD-10-CM

## 2015-11-15 DIAGNOSIS — B351 Tinea unguium: Secondary | ICD-10-CM

## 2015-11-15 DIAGNOSIS — L603 Nail dystrophy: Secondary | ICD-10-CM | POA: Diagnosis not present

## 2015-11-15 DIAGNOSIS — M79609 Pain in unspecified limb: Secondary | ICD-10-CM

## 2015-11-15 DIAGNOSIS — L608 Other nail disorders: Secondary | ICD-10-CM

## 2015-11-15 DIAGNOSIS — E0843 Diabetes mellitus due to underlying condition with diabetic autonomic (poly)neuropathy: Secondary | ICD-10-CM | POA: Diagnosis not present

## 2015-11-15 NOTE — Patient Instructions (Signed)

## 2015-11-16 NOTE — Progress Notes (Signed)
SUBJECTIVE Patient with a history of diabetes mellitus presents to office today complaining of elongated, thickened nails. Pain while ambulating in shoes. Patient is unable to trim their own nails.  Patient also complains of a painful toenail to the right great toe. Patient states that it is incredibly painful with shoe gear as it causes pressure on top of the nail. Patient states the pains been ongoing for several months now.  Allergies  Allergen Reactions  . Tape Rash  . Latex Itching  . Sulfa Antibiotics Nausea And Vomiting  . Zoster Vaccine Live Hives and Rash    localized Localized; Vaccine for shingles localized Localized; Vaccine for shingles    OBJECTIVE General Patient is awake, alert, and oriented x 3 and in no acute distress. Derm Right great toenail is tented and has developed into a pincer nail. Skin is dry and supple bilateral. Negative open lesions or macerations. Remaining integument unremarkable. Nails are tender, long, thickened and dystrophic with subungual debris, consistent with onychomycosis, 1-5 bilateral. No signs of infection noted. Vasc  DP and PT pedal pulses palpable bilaterally. Temperature gradient within normal limits.  Neuro Epicritic and protective threshold sensation diminished bilaterally.  Musculoskeletal Exam significant pain on palpation noted to the right great toenail. No symptomatic pedal deformities noted bilateral. Muscular strength within normal limits.  ASSESSMENT 1. Diabetes Mellitus w/ peripheral neuropathy 2. Onychomycosis of nail due to dermatophyte bilateral 3. Pain in foot bilateral 4. Pincer nail right great toe-painful  PLAN OF CARE 1. Patient evaluated today. 2. Instructed to maintain good pedal hygiene and foot care. Stressed importance of controlling blood sugar.  3. Mechanical debridement of nails 1-5 bilaterally performed using a nail nipper. Filed with dremel without incident.  4. Total permanent nail avulsion was performed  to the right great toe after local anesthesia infiltration consisting of a 50-50 mixture of 2% lidocaine plain with 0.5% Marcaine plain. [id number] seconds applications of phenol were applied followed by alcohol flush. Dry sterile dressing applied. 5. Return to clinic in 2 weeks  Edrick Kins, [place]

## 2015-12-03 ENCOUNTER — Ambulatory Visit (INDEPENDENT_AMBULATORY_CARE_PROVIDER_SITE_OTHER): Payer: Medicare Other | Admitting: Podiatry

## 2015-12-03 ENCOUNTER — Encounter: Payer: Self-pay | Admitting: Podiatry

## 2015-12-03 DIAGNOSIS — M79676 Pain in unspecified toe(s): Secondary | ICD-10-CM

## 2015-12-03 DIAGNOSIS — S91109D Unspecified open wound of unspecified toe(s) without damage to nail, subsequent encounter: Secondary | ICD-10-CM

## 2015-12-03 NOTE — Progress Notes (Signed)
Subjective: Patient presents today 2 weeks post total permanent nail avulsion procedure. Patient states that the toe and nail fold is feeling much better.  Objective: Skin is warm, dry and supple. Nail and respective nail fold appears to be healing appropriately. Open wound to the associated nail fold with a granular wound base and moderate amount of fibrotic tissue. Minimal drainage noted. Mild erythema around the periungual region likely due to phenol chemical matricectomy.  Assessment: #1 postop permanent total nail avulsion #2 open wound periungual nail bed of respective digit.   Plan of care: #1 patient was evaluated  #2 debridement of open wound was performed to the periungual border of the respective toe using a currette. Antibiotic ointment and Band-Aid was applied. #3 patient is to return to clinic on a PRN  basis.

## 2015-12-16 DIAGNOSIS — Z0000 Encounter for general adult medical examination without abnormal findings: Secondary | ICD-10-CM | POA: Insufficient documentation

## 2015-12-19 ENCOUNTER — Telehealth: Payer: Self-pay

## 2015-12-19 NOTE — Telephone Encounter (Signed)
PA for cialis was DENIED.

## 2016-03-05 ENCOUNTER — Ambulatory Visit: Payer: Private Health Insurance - Indemnity | Admitting: Podiatry

## 2016-03-10 ENCOUNTER — Ambulatory Visit (INDEPENDENT_AMBULATORY_CARE_PROVIDER_SITE_OTHER): Payer: Private Health Insurance - Indemnity | Admitting: Podiatry

## 2016-03-10 ENCOUNTER — Encounter: Payer: Self-pay | Admitting: Podiatry

## 2016-03-10 DIAGNOSIS — B351 Tinea unguium: Secondary | ICD-10-CM

## 2016-03-10 DIAGNOSIS — E0843 Diabetes mellitus due to underlying condition with diabetic autonomic (poly)neuropathy: Secondary | ICD-10-CM

## 2016-03-10 DIAGNOSIS — M79609 Pain in unspecified limb: Secondary | ICD-10-CM

## 2016-03-10 DIAGNOSIS — L603 Nail dystrophy: Secondary | ICD-10-CM

## 2016-03-10 DIAGNOSIS — L608 Other nail disorders: Secondary | ICD-10-CM

## 2016-03-20 NOTE — Progress Notes (Signed)
   SUBJECTIVE Patient with a history of diabetes mellitus presents to office today complaining of elongated, thickened nails. Pain while ambulating in shoes. Patient is unable to trim their own nails.   OBJECTIVE General Patient is awake, alert, and oriented x 3 and in no acute distress. Derm Skin is dry and supple bilateral. Negative open lesions or macerations. Remaining integument unremarkable. Nails are tender, long, thickened and dystrophic with subungual debris, consistent with onychomycosis, 1-5 bilateral. No signs of infection noted. Vasc  DP and PT pedal pulses palpable bilaterally. Temperature gradient within normal limits.  Neuro Epicritic and protective threshold sensation diminished bilaterally.  Musculoskeletal Exam No symptomatic pedal deformities noted bilateral. Muscular strength within normal limits.  ASSESSMENT 1. Diabetes Mellitus w/ peripheral neuropathy 2. Onychomycosis of nail due to dermatophyte bilateral 3. Pain in foot bilateral  PLAN OF CARE 1. Patient evaluated today. 2. Instructed to maintain good pedal hygiene and foot care. Stressed importance of controlling blood sugar.  3. Mechanical debridement of nails 1-5 bilaterally performed using a nail nipper. Filed with dremel without incident.  4. Return to clinic in 3 mos.     Brent M. Evans, DPM [facility]  Dr. Brent M. Evans, DPM    [address]                                        [place], [place] [address]                Office [phone]  Fax [phone]       

## 2016-06-11 ENCOUNTER — Ambulatory Visit (INDEPENDENT_AMBULATORY_CARE_PROVIDER_SITE_OTHER): Payer: Medicare Other | Admitting: Podiatry

## 2016-06-11 ENCOUNTER — Encounter: Payer: Self-pay | Admitting: Podiatry

## 2016-06-11 DIAGNOSIS — L608 Other nail disorders: Secondary | ICD-10-CM | POA: Diagnosis not present

## 2016-06-11 DIAGNOSIS — M79609 Pain in unspecified limb: Principal | ICD-10-CM

## 2016-06-11 DIAGNOSIS — M79676 Pain in unspecified toe(s): Secondary | ICD-10-CM

## 2016-06-11 DIAGNOSIS — B351 Tinea unguium: Secondary | ICD-10-CM | POA: Diagnosis not present

## 2016-06-11 NOTE — Progress Notes (Signed)
Complaint:  Visit Type: Patient returns to my office for continued preventative foot care services. Complaint: Patient states" my nails have grown long and thick and become painful to walk and wear shoes" Patient has been diagnosed with DM with neuropathy.. The patient presents for preventative foot care services. No changes to ROS  Podiatric Exam: Vascular: dorsalis pedis and posterior tibial pulses are palpable bilateral. Capillary return is immediate. Temperature gradient is WNL. Skin turgor WNL  Sensorium: Diminished  Semmes Weinstein monofilament test. Normal tactile sensation bilaterally. Nail Exam: Pt has thick disfigured discolored nails with subungual debris noted bilateral entire nail hallux through fifth toenails Ulcer Exam: There is no evidence of ulcer or pre-ulcerative changes or infection. Orthopedic Exam: Muscle tone and strength are WNL. No limitations in general ROM. No crepitus or effusions noted. Foot type and digits show no abnormalities. Bony prominences are unremarkable. Skin: No Porokeratosis. No infection or ulcers  Diagnosis:  Onychomycosis, , Pain in right toe, pain in left toes  Treatment & Plan Procedures and Treatment: Consent by patient was obtained for treatment procedures. The patient understood the discussion of treatment and procedures well. All questions were answered thoroughly reviewed. Debridement of mycotic and hypertrophic toenails, 1 through 5 bilateral and clearing of subungual debris. No ulceration, no infection noted.  Return Visit-Office Procedure: Patient instructed to return to the office for a follow up visit 3 months for continued evaluation and treatment.    Egon Dittus DPM 

## 2016-07-19 ENCOUNTER — Emergency Department
Admission: EM | Admit: 2016-07-19 | Discharge: 2016-07-19 | Disposition: A | Discharge status: Home / Self Care | Payer: Medicare Other | Attending: Emergency Medicine | Admitting: Emergency Medicine

## 2016-07-19 DIAGNOSIS — Z87891 Personal history of nicotine dependence: Secondary | ICD-10-CM | POA: Diagnosis not present

## 2016-07-19 DIAGNOSIS — Z7951 Long term (current) use of inhaled steroids: Secondary | ICD-10-CM | POA: Insufficient documentation

## 2016-07-19 DIAGNOSIS — I10 Essential (primary) hypertension: Secondary | ICD-10-CM

## 2016-07-19 DIAGNOSIS — Z7984 Long term (current) use of oral hypoglycemic drugs: Secondary | ICD-10-CM | POA: Diagnosis not present

## 2016-07-19 DIAGNOSIS — I129 Hypertensive chronic kidney disease with stage 1 through stage 4 chronic kidney disease, or unspecified chronic kidney disease: Secondary | ICD-10-CM | POA: Diagnosis present

## 2016-07-19 DIAGNOSIS — Z960 Presence of urogenital implants: Secondary | ICD-10-CM | POA: Insufficient documentation

## 2016-07-19 DIAGNOSIS — E1122 Type 2 diabetes mellitus with diabetic chronic kidney disease: Secondary | ICD-10-CM | POA: Diagnosis not present

## 2016-07-19 DIAGNOSIS — Z79899 Other long term (current) drug therapy: Secondary | ICD-10-CM | POA: Insufficient documentation

## 2016-07-19 DIAGNOSIS — N189 Chronic kidney disease, unspecified: Secondary | ICD-10-CM | POA: Insufficient documentation

## 2016-07-19 DIAGNOSIS — Z91040 Latex allergy status: Secondary | ICD-10-CM | POA: Diagnosis not present

## 2016-07-19 LAB — CBC
HCT: 48.2 % (ref 40.0–52.0)
Hemoglobin: 15.8 g/dL (ref 13.0–18.0)
MCH: 28 pg (ref 26.0–34.0)
MCHC: 32.8 g/dL (ref 32.0–36.0)
MCV: 85.6 fL (ref 80.0–100.0)
Platelets: 199 10*3/uL (ref 150–440)
RBC: 5.63 MIL/uL (ref 4.40–5.90)
RDW: 14.2 % (ref 11.5–14.5)
WBC: 7.6 10*3/uL (ref 3.8–10.6)

## 2016-07-19 LAB — BASIC METABOLIC PANEL
Anion gap: 7 (ref 5–15)
BUN: 24 mg/dL — ABNORMAL HIGH (ref 6–20)
CO2: 29 mmol/L (ref 22–32)
Calcium: 9.8 mg/dL (ref 8.9–10.3)
Chloride: 101 mmol/L (ref 101–111)
Creatinine, Ser: 1.21 mg/dL (ref 0.61–1.24)
GFR calc Af Amer: >60 mL/min (ref >60)
GFR calc non Af Amer: 54 mL/min — ABNORMAL LOW (ref >60)
Glucose, Bld: 240 mg/dL — ABNORMAL HIGH (ref 65–99)
Potassium: 4.4 mmol/L (ref 3.5–5.1)
Sodium: 137 mmol/L (ref 135–145)

## 2016-07-19 LAB — TROPONIN I: Troponin I: <0.03 ng/mL (ref <0.03)

## 2016-07-19 NOTE — ED Provider Notes (Signed)
[facility] Emergency Department Provider Note  ____________________________________________   I have reviewed the triage vital signs and the nursing notes.   HISTORY  Chief Complaint Hypertension   History limited by: Not Limited   HPI Jared Ball is a 83 y.o. male who presents to the emergency department today with primary concern for high blood pressure. Patient states he hasn't felt quite right for the past few days. He states that he felt somewhat lightheaded. During this time he noticed that his blood pressure has been high. He states systolic was over 563 last night. He does have a history of high blood pressure and is on metoprolol. He states he has been under a lot of stress recently. When he talked to the security guard at his living facility today they recommended he be evaluated.   Past Medical History:  Diagnosis Date  . Anxiety   . Arthritis   . Chronic kidney disease   . Diabetes mellitus without complication ([place])   . Dysrhythmia   . GERD (gastroesophageal reflux disease)   . Headache   . Hypertension   . Shortness of breath dyspnea   . Spinal stenosis     Patient Active Problem List   Diagnosis Date Noted  . Type 2 diabetes mellitus not at goal [facility]) 09/03/2015  . Chest pain 08/21/2014  . Abnormal echocardiogram 08/21/2014  . Normal radionuclide heart study 08/21/2014  . Obesity 08/21/2014  . Beat, premature ventricular 08/21/2014  . Breath shortness 08/21/2014  . Diabetes mellitus type 2, uncontrolled ([place]) 08/21/2014  . HTN (hypertension), benign 10/10/2013  . Abnormal weight gain 06/23/2013  . Lumbar canal stenosis 06/21/2013  . Back pain 04/21/2013  . Bursitis of hip 04/21/2013  . Cough, persistent 12/29/2012  . Productive cough 12/07/2012  . Sinus infection 12/07/2012  . Reflux 12/03/2011  . Atrial fibrillation ([place]) 07/13/2011  . Elevated prostate specific antigen (PSA) 07/13/2011  . Hypercholesterolemia 07/13/2011   . BP (high blood pressure) 07/13/2011  . LBP (low back pain) 07/13/2011  . Awareness of heartbeats 07/13/2011  . Cutaneous eruption 07/13/2011  . Apnea, sleep 07/13/2011  . Type II diabetes mellitus ([place]) 07/13/2011    Past Surgical History:  Procedure Laterality Date  . COLONOSCOPY    . CYSTOSCOPY W/ RETROGRADES Bilateral 07/02/2014   Procedure: CYSTOSCOPY WITH RETROGRADE PYELOGRAM;  Surgeon: Hollice Espy, MD;  Location: [facility] ORS;  Service: Urology;  Laterality: Bilateral;  . CYSTOSCOPY WITH STENT PLACEMENT Left 07/02/2014   Procedure: CYSTOSCOPY WITH STENT PLACEMENT;  Surgeon: Hollice Espy, MD;  Location: [facility] ORS;  Service: Urology;  Laterality: Left;  . TONSILLECTOMY    . URETEROSCOPY WITH HOLMIUM LASER LITHOTRIPSY Left 07/02/2014   Procedure: URETEROSCOPY WITH HOLMIUM LASER LITHOTRIPSY;  Surgeon: Hollice Espy, MD;  Location: [facility] ORS;  Service: Urology;  Laterality: Left;    Prior to Admission medications   Medication Sig Start Date End Date Taking? Authorizing Provider  acetaminophen (TYLENOL) 500 MG tablet Take by mouth.    [provider]  aspirin EC 81 MG tablet Take 81 mg by mouth daily.     [provider]  azelastine (ASTELIN) 0.1 % nasal spray Place into the nose. 02/06/15 02/06/16  [provider]  finasteride (PROSCAR) 5 MG tablet Take 1 tablet (5 mg total) by mouth daily. 09/03/15   Zara Council A, PA-C  flecainide (TAMBOCOR) 100 MG tablet Take 100 mg by mouth 2 (two) times daily. 06/05/14   [provider]  fluticasone (FLONASE) 50 MCG/ACT nasal  spray Place into the nose. Reported on 06/25/2015 04/28/12   [provider]  glimepiride (AMARYL) 2 MG tablet Take 1 mg by mouth every other day.  06/10/14   [provider]  glucose (SUNMARK GLUCOSE) 4 GM chewable tablet Reported on 06/25/2015    [provider]  glucose blood (BAYER CONTOUR TEST) test strip  03/22/13   [provider]   HYDROcodone-acetaminophen (NORCO/VICODIN) 5-325 MG per tablet Take 1-2 tablets by mouth every 6 (six) hours as needed for moderate pain. Patient not taking: Reported on 11/15/2015 07/02/14   Hollice Espy, MD  loperamide (IMODIUM A-D) 2 MG tablet Take by mouth.    [provider]  lovastatin (MEVACOR) 40 MG tablet Take 40 mg by mouth at bedtime.  06/18/14   [provider]  metFORMIN (GLUCOPHAGE) 500 MG tablet Take 500 mg by mouth 3 (three) times daily. 05/17/14   [provider]  metoprolol (LOPRESSOR) 50 MG tablet TAKE 0.5 TABLETS (25 MG TOTAL) BY MOUTH NIGHTLY. 06/17/14   [provider]  metoprolol tartrate (LOPRESSOR) 25 MG tablet Take 12.5 mg by mouth daily. Reported on 06/25/2015    [provider]  Misc Natural Products (GLUCOSAMINE CHOND COMPLEX/MSM) TABS Reported on 06/25/2015    [provider]  pimecrolimus (ELIDEL) 1 % cream Apply 1 application topically 2 (two) times daily as needed. Reported on 06/25/2015    [provider]  tadalafil (CIALIS) 5 MG tablet Take 1 tablet (5 mg total) by mouth daily as needed for erectile dysfunction. 09/03/15   Zara Council A, PA-C  tamsulosin (FLOMAX) 0.4 MG CAPS capsule Take 0.4 mg by mouth daily.  06/15/14   [provider]  torsemide (DEMADEX) 5 MG tablet Take by mouth. 05/29/15   [provider]  triamcinolone ointment (KENALOG) 0.1 % Apply 1 application topically 2 (two) times daily as needed.  03/13/14   [provider]    Allergies Tape; Latex; Sulfa antibiotics; and Zoster vaccine live  Family History  Problem Relation Age of Onset  . Kidney disease Mother   . Prostate cancer Neg Hx     Social History Social History  Substance Use Topics  . Smoking status: Former Research scientist (life sciences)  . Smokeless tobacco: Never Used  . Alcohol use 0.0 oz/week     Comment: red wine occasionally    Review of Systems Constitutional: No fever/chills Eyes: No visual changes. ENT:  Positive for intermittent bilateral jaw pain. Cardiovascular: Denies chest pain. Respiratory: Positive for chronic shortness of breath. Gastrointestinal: No abdominal pain.  No nausea, no vomiting.  No diarrhea.   Genitourinary: Negative for dysuria. Musculoskeletal: Negative for back pain. Skin: Negative for rash. Neurological: Positive for intermittent headache.  ____________________________________________   PHYSICAL EXAM:  VITAL SIGNS: ED Triage Vitals  Enc Vitals Group     BP 07/19/16 1139 (!) 154/91     Pulse Rate 07/19/16 1139 72     Resp 07/19/16 1139 18     Temp 07/19/16 1139 98 F (36.7 C)     Temp Source 07/19/16 1139 Oral     SpO2 07/19/16 1139 97 %     Weight 07/19/16 1140 223 lb (101.2 kg)     Height 07/19/16 1140 5\' 10"  (1.778 m)     Head Circumference --      Peak Flow --      Pain Score 07/19/16 1139 0     Pain Loc --      Pain Edu? --  Excl. in Felt? --      Constitutional: Alert and oriented. Well appearing and in no distress. Eyes: Conjunctivae are normal.  ENT   Head: Normocephalic and atraumatic.   Nose: No congestion/rhinnorhea.   Mouth/Throat: Mucous membranes are moist.   Neck: No stridor. Hematological/Lymphatic/Immunilogical: No cervical lymphadenopathy. Cardiovascular: Normal rate, regular rhythm.  No murmurs, rubs, or gallops.  Respiratory: Normal respiratory effort without tachypnea nor retractions. Breath sounds are clear and equal bilaterally. No wheezes/rales/rhonchi. Gastrointestinal: Soft and non tender. No rebound. No guarding.  Genitourinary: Deferred Musculoskeletal: Normal range of motion in all extremities.  Neurologic:  Normal speech and language. No gross focal neurologic deficits are appreciated.  Skin:  Skin is warm, dry and intact. No rash noted. Psychiatric: Mood and affect are normal. Speech and behavior are normal. Patient exhibits appropriate insight and  judgment.  ____________________________________________    LABS (pertinent positives/negatives)  Labs Reviewed  BASIC METABOLIC PANEL - Abnormal; Notable for the following:       Result Value   Glucose, Bld 240 (*)    BUN 24 (*)    GFR calc non Af Amer 54 (*)    All other components within normal limits  CBC  TROPONIN I     ____________________________________________   EKG  I, Nance Pear, attending physician, personally viewed and interpreted this EKG  EKG Time: 1141 Rate: 76 Rhythm: normal sinus rhythm Axis: normal Intervals: qtc 445 QRS: narrow ST changes: no st elevation Impression: normal ekg  ____________________________________________    RADIOLOGY  None  ____________________________________________   PROCEDURES  Procedures  ____________________________________________   INITIAL IMPRESSION / ASSESSMENT AND PLAN / ED COURSE  Pertinent labs & imaging results that were available during my care of the patient were reviewed by me and considered in my medical decision making (see chart for details).  Patient presented to the emergency department today because of concerns for high blood pressure. Patient's blood pressure is mildly elevated here. However I do not feel patient suffering any acute illness from it. Additionally patient's blood sugars were found behind the blood work. Patient is a known diabetic. Did discuss with the patient importance of following up with primary care physician. Discussed return precautions.  ____________________________________________   FINAL CLINICAL IMPRESSION(S) / ED DIAGNOSES  Final diagnoses:  Hypertension, unspecified type     Note: This dictation was prepared with Dragon dictation. Any transcriptional errors that result from this process are unintentional     Nance Pear, MD 07/19/16 1339

## 2016-07-19 NOTE — ED Triage Notes (Addendum)
Pt arrived via EMS from home at [place] with c/o HTN.  Pt states his systolic at home was 561 last night. Pt has hx of vertigo, DM and HTN.  Pt also has hx of anxiety. Pt is alert and oriented x 4 and is able to care for himself.   Pt states he has been feeling groggy, and reports increased lightheadedness over the past several days.  Pt is also a diabetic, has afib and anxiety. Pt states he has frequent anxiety and has had more anxiety today since BP was elevated.  Pt did take flecanide for afib.

## 2016-07-19 NOTE — Discharge Instructions (Signed)
Please seek medical attention for any high fevers, chest pain, shortness of breath, change in behavior, persistent vomiting, bloody stool or any other new or concerning symptoms.  

## 2016-09-01 NOTE — Progress Notes (Deleted)
09/02/2016 10:00 PM   Jared Ball 1933/01/05 [id number]  Referring provider: Kirk Ruths, MD [facility] [place], [place] [address]  No chief complaint on file.   HPI: Patient is an 83 year old Caucasian male who presents today for a follow up for BPH with LUTS and a history of nephrolithiasis.  BPH WITH LUTS His IPSS score today is ***, which is *** lower urinary tract symptomatology. He is *** with his quality life due to his urinary symptoms.  His previous I PSS score was 20/3. His major complaint(s) today are/is *** He has had these symptoms for several years.  He denies any dysuria, hematuria or suprapubic pain.  He currently taking Cialis 5 mg daily and finasteride 5 mg daily.   He also denies any recent fevers, chills, nausea or vomiting.  He does not have a family history of PCa.    Score:  1-7 Mild 8-19 Moderate 20-35 Severe    History of nephrolithiasis We had last seen the patient in 08/2014 for 2 non obstructing stones, measuring up to 11 mm for which he underwent a left URS/LL/ureteral sent and stent removal.  He did experience postoperative retention   KUB taken 08/2015 does not demonstrate any urinary stones.  He has not had any passage of fragments or flank pain.  He has not had blood in his urine.   PMH: Past Medical History:  Diagnosis Date  . Anxiety   . Arthritis   . Chronic kidney disease   . Diabetes mellitus without complication ([place])   . Dysrhythmia   . GERD (gastroesophageal reflux disease)   . Headache   . Hypertension   . Shortness of breath dyspnea   . Spinal stenosis     Surgical History: Past Surgical History:  Procedure Laterality Date  . COLONOSCOPY    . CYSTOSCOPY W/ RETROGRADES Bilateral 07/02/2014   Procedure: CYSTOSCOPY WITH RETROGRADE PYELOGRAM;  Surgeon: Hollice Espy, MD;  Location: [facility] ORS;  Service: Urology;  Laterality: Bilateral;  . CYSTOSCOPY WITH STENT PLACEMENT Left 07/02/2014   Procedure: CYSTOSCOPY WITH STENT PLACEMENT;  Surgeon: Hollice Espy, MD;  Location: [facility] OR[facility];  Service: Urology;  Laterality: Left;  . TONSILLECTOMY    . URETEROSCOPY WITH HOLMIUM LASER LITHOTRIPSY Left 07/02/2014   Procedure: URETEROSCOPY WITH HOLMIUM LASER LITHOTRIPSY;  Surgeon: Hollice Espy, MD;  Location: [facility] ORS;  Service: Urology;  Laterality: Left;    Home Medications:  Allergies as of 09/02/2016      Reactions   Tape Rash   Latex Itching   Sulfa Antibiotics Nausea And Vomiting   Zoster Vaccine Live Hives, Rash   localized Localized; Vaccine for shingles localized Localized; Vaccine for shingles      Medication List       Accurate as of 09/01/16 10:00 PM. Always use your most recent med list.          acetaminophen 500 MG tablet Commonly known as:  TYLENOL Take by mouth.   aspirin EC 81 MG tablet Take 81 mg by mouth daily.   azelastine 0.1 % nasal spray Commonly known as:  ASTELIN Place into the nose.   BAYER CONTOUR TEST test strip Generic drug:  glucose blood   ELIDEL 1 % cream Generic drug:  pimecrolimus Apply 1 application topically 2 (two) times daily as needed. Reported on 06/25/2015   finasteride 5 MG tablet Commonly known as:  PROSCAR Take 1 tablet (5 mg total) by mouth daily.  flecainide 100 MG tablet Commonly known as:  TAMBOCOR Take 100 mg by mouth 2 (two) times daily.   fluticasone 50 MCG/ACT nasal spray Commonly known as:  FLONASE Place into the nose. Reported on 06/25/2015   glimepiride 2 MG tablet Commonly known as:  AMARYL Take 1 mg by mouth every other day.   GLUCOSAMINE CHOND COMPLEX/MSM Tabs Reported on 06/25/2015   HYDROcodone-acetaminophen 5-325 MG tablet Commonly known as:  NORCO/VICODIN Take 1-2 tablets by mouth every 6 (six) hours as needed for moderate pain.   loperamide 2 MG tablet Commonly known as:  IMODIUM A-D Take by mouth.   lovastatin 40 MG tablet Commonly known as:  MEVACOR Take 40 mg by mouth at  bedtime.   metFORMIN 500 MG tablet Commonly known as:  GLUCOPHAGE Take 500 mg by mouth 3 (three) times daily.   metoprolol tartrate 25 MG tablet Commonly known as:  LOPRESSOR Take 12.5 mg by mouth daily. Reported on 06/25/2015   metoprolol tartrate 50 MG tablet Commonly known as:  LOPRESSOR TAKE 0.5 TABLETS (25 MG TOTAL) BY MOUTH NIGHTLY.   SUNMARK GLUCOSE 4 GM chewable tablet Generic drug:  glucose Reported on 06/25/2015   tadalafil 5 MG tablet Commonly known as:  CIALIS Take 1 tablet (5 mg total) by mouth daily as needed for erectile dysfunction.   tamsulosin 0.4 MG Caps capsule Commonly known as:  FLOMAX Take 0.4 mg by mouth daily.   torsemide 5 MG tablet Commonly known as:  DEMADEX Take by mouth.   triamcinolone ointment 0.1 % Commonly known as:  KENALOG Apply 1 application topically 2 (two) times daily as needed.       Allergies:  Allergies  Allergen Reactions  . Tape Rash  . Latex Itching  . Sulfa Antibiotics Nausea And Vomiting  . Zoster Vaccine Live Hives and Rash    localized Localized; Vaccine for shingles localized Localized; Vaccine for shingles    Family History: Family History  Problem Relation Age of Onset  . Kidney disease Mother   . Prostate cancer Neg Hx     Social History:  reports that he has quit smoking. He has never used smokeless tobacco. He reports that he drinks alcohol. He reports that he does not use drugs.  ROS:                                        Physical Exam: There were no vitals taken for this visit.  Constitutional: Well nourished. Alert and oriented, No acute distress. HEENT: [place] AT, moist mucus membranes. Trachea midline, no masses. Cardiovascular: No clubbing, cyanosis, or edema. Respiratory: Normal respiratory effort, no increased work of breathing. GI: Abdomen is soft, non tender, non distended, no abdominal masses. Liver and spleen not palpable.  No hernias appreciated.  Stool sample  for occult testing is not indicated.   GU: No CVA tenderness.  No bladder fullness or masses.  Patient with uncircumcised phallus. Foreskin easily retracted Urethral meatus is patent.  No penile discharge. No penile lesions or rashes. Scrotum without lesions, cysts, rashes and/or edema.  Testicles are located scrotally bilaterally. No masses are appreciated in the testicles. Left and right epididymis are normal. Rectal: Patient with  normal sphincter tone. Anus and perineum without scarring or rashes. No rectal masses are appreciated. Prostate is approximately 60 grams, irregular, no nodules are appreciated. Seminal vesicles are normal. Skin: No rashes, bruises or suspicious lesions.  Lymph: No cervical or inguinal adenopathy. Neurologic: Grossly intact, no focal deficits, moving all 4 extremities. Psychiatric: Normal mood and affect.  Laboratory Data: Lab Results  Component Value Date   WBC 7.6 07/19/2016   HGB 15.8 07/19/2016   HCT 48.2 07/19/2016   MCV 85.6 07/19/2016   PLT 199 07/19/2016    Lab Results  Component Value Date   CREATININE 1.21 07/19/2016    Pertinent Imaging: Results for orders placed or performed during the hospital encounter of 32/99/24  Basic metabolic panel  Result Value Ref Range   Sodium 137 135 - 145 mmol/L   Potassium 4.4 3.5 - 5.1 mmol/L   Chloride 101 101 - 111 mmol/L   CO2 29 22 - 32 mmol/L   Glucose, Bld 240 (H) 65 - 99 mg/dL   BUN 24 (H) 6 - 20 mg/dL   Creatinine, Ser 1.21 0.61 - 1.24 mg/dL   Calcium 9.8 8.9 - 10.3 mg/dL   GFR calc non Af Amer 54 (L) >60 mL/min   GFR calc Af Amer >60 >60 mL/min   Anion gap 7 5 - 15  CBC  Result Value Ref Range   WBC 7.6 3.8 - 10.6 K/uL   RBC 5.63 4.40 - 5.90 MIL/uL   Hemoglobin 15.8 13.0 - 18.0 g/dL   HCT 48.2 40.0 - 52.0 %   MCV 85.6 80.0 - 100.0 fL   MCH 28.0 26.0 - 34.0 pg   MCHC 32.8 32.0 - 36.0 g/dL   RDW 14.2 11.5 - 14.5 %   Platelets 199 150 - 440 K/uL  Troponin I  Result Value Ref Range    Troponin I <0.03 <0.03 ng/mL   I have reviewed the labs  Assessment & Plan:    1. BPH with LUTS  - IPSS score is 20/3  - Continue conservative management, avoiding bladder irritants and timed voiding's  - Continue Cialis 5 mg daily and finasteride 5 mg daily  - RTC in 12 months for IPSS and exam   - BLADDER SCAN AMB NON-IMAGING  2. History of nephrolithiasis:   KUB did not demonstrate any urinary stones.  KUB in one year.  3. History of urinary retention:   Resolved.     No Follow-up on file.  These notes generated with voice recognition software. I apologize for typographical errors.  Zara Council, [facility] [address] [place], [place] [address] [phone]

## 2016-09-02 ENCOUNTER — Other Ambulatory Visit: Payer: Self-pay | Admitting: Neurology

## 2016-09-02 ENCOUNTER — Ambulatory Visit: Payer: Medicare Other | Admitting: Urology

## 2016-09-02 DIAGNOSIS — R4789 Other speech disturbances: Secondary | ICD-10-CM

## 2016-09-03 ENCOUNTER — Ambulatory Visit: Payer: Private Health Insurance - Indemnity | Admitting: Podiatry

## 2016-09-11 ENCOUNTER — Other Ambulatory Visit: Payer: Self-pay

## 2016-09-11 ENCOUNTER — Other Ambulatory Visit: Payer: Self-pay | Admitting: Urology

## 2016-09-11 DIAGNOSIS — N401 Enlarged prostate with lower urinary tract symptoms: Secondary | ICD-10-CM

## 2016-09-11 MED ORDER — FINASTERIDE 5 MG PO TABS: 5.0000 mg | ORAL_TABLET | Freq: Every day | ORAL | 0 refills | Status: DC

## 2016-09-17 ENCOUNTER — Ambulatory Visit (INDEPENDENT_AMBULATORY_CARE_PROVIDER_SITE_OTHER): Payer: Medicare Other | Admitting: Podiatry

## 2016-09-17 ENCOUNTER — Ambulatory Visit: Payer: Private Health Insurance - Indemnity | Admitting: Podiatry

## 2016-09-17 ENCOUNTER — Encounter: Payer: Self-pay | Admitting: Podiatry

## 2016-09-17 DIAGNOSIS — M79609 Pain in unspecified limb: Secondary | ICD-10-CM

## 2016-09-17 DIAGNOSIS — B351 Tinea unguium: Secondary | ICD-10-CM

## 2016-09-17 DIAGNOSIS — L608 Other nail disorders: Secondary | ICD-10-CM

## 2016-09-17 DIAGNOSIS — E1142 Type 2 diabetes mellitus with diabetic polyneuropathy: Secondary | ICD-10-CM

## 2016-09-17 NOTE — Progress Notes (Signed)
Complaint:  Visit Type: Patient returns to my office for continued preventative foot care services. Complaint: Patient states" my nails have grown long and thick and become painful to walk and wear shoes" Patient has been diagnosed with DM with neuropathy.. The patient presents for preventative foot care services. No changes to ROS  Podiatric Exam: Vascular: dorsalis pedis and posterior tibial pulses are palpable bilateral. Capillary return is immediate. Temperature gradient is WNL. Skin turgor WNL  Sensorium: Diminished  Semmes Weinstein monofilament test. Normal tactile sensation bilaterally. Nail Exam: Pt has thick disfigured discolored nails with subungual debris noted bilateral entire nail hallux through fifth toenails Ulcer Exam: There is no evidence of ulcer or pre-ulcerative changes or infection. Orthopedic Exam: Muscle tone and strength are WNL. No limitations in general ROM. No crepitus or effusions noted. Foot type and digits show no abnormalities. Bony prominences are unremarkable. Skin: No Porokeratosis. No infection or ulcers  Diagnosis:  Onychomycosis, , Pain in right toe, pain in left toes  Treatment & Plan Procedures and Treatment: Consent by patient was obtained for treatment procedures. The patient understood the discussion of treatment and procedures well. All questions were answered thoroughly reviewed. Debridement of mycotic and hypertrophic toenails, 1 through 5 bilateral and clearing of subungual debris. No ulceration, no infection noted.  Return Visit-Office Procedure: Patient instructed to return to the office for a follow up visit 3 months for continued evaluation and treatment.    Kysha Muralles DPM 

## 2016-09-22 ENCOUNTER — Ambulatory Visit: Payer: Medicare Other

## 2016-09-28 NOTE — Progress Notes (Signed)
09/29/2016 3:00 PM   Jared Ball October 27, 1932 [id number]  Referring provider: Kirk Ruths, MD [name] [facility] [place], [place] [address]  Chief Complaint  Patient presents with  . Benign Prostatic Hypertrophy    1 year followup   . Nephrolithiasis    HPI: Patient is an 83 year old Caucasian male who presents today for a follow up for BPH with LUTS and a history of nephrolithiasis.  BPH WITH LUTS His IPSS score today is 16, which is moderate lower urinary tract symptomatology. He is mixed with his quality life due to his urinary symptoms.  His previous I PSS score was 20/3. His major complaint(s) today are/is frequency, urgency, nocturia, incontinence and a weak urinary stream.   He has had these symptoms for several years.  He denies any dysuria, hematuria or suprapubic pain.  He currently taking Cialis 5 mg daily and finasteride 5 mg daily.   He also denies any recent fevers, chills, nausea or vomiting.  He does not have a family history of PCa.      IPSS    Row Name 09/29/16 1400         International Prostate Symptom Score   How often have you had the sensation of not emptying your bladder? About half the time     How often have you had to urinate less than every two hours? About half the time     How often have you found you stopped and started again several times when you urinated? About half the time     How often have you found it difficult to postpone urination? About half the time     How often have you had a weak urinary stream? About half the time     How often have you had to strain to start urination? Not at All     How many times did you typically get up at night to urinate? 1 Time     Total IPSS Score 16       Quality of Life due to urinary symptoms   If you were to spend the rest of your life with your urinary condition just the way it is now how would you feel about that? Mixed        Score:  1-7 Mild 8-19  Moderate 20-35 Severe    History of nephrolithiasis We had last seen the patient in 08/2014 for 2 non obstructing stones, measuring up to 11 mm for which he underwent a left URS/LL/ureteral sent and stent removal.  He did experience postoperative retention   KUB taken 08/2015 does not demonstrate any urinary stones.  He has not had any passage of fragments or flank pain.  He has not had blood in his urine.   PMH: Past Medical History:  Diagnosis Date  . Anxiety   . Arthritis   . Chronic kidney disease   . Diabetes mellitus without complication ([place])   . Dysrhythmia   . GERD (gastroesophageal reflux disease)   . Headache   . Hypertension   . Shortness of breath dyspnea   . Spinal stenosis     Surgical History: Past Surgical History:  Procedure Laterality Date  . COLONOSCOPY    . CYSTOSCOPY W/ RETROGRADES Bilateral 07/02/2014   Procedure: CYSTOSCOPY WITH RETROGRADE PYELOGRAM;  Surgeon: Hollice Espy, MD;  Location: [facility] OR[facility];  Service: Urology;  Laterality: Bilateral;  . CYSTOSCOPY WITH STENT PLACEMENT Left 07/02/2014   Procedure: CYSTOSCOPY  WITH STENT PLACEMENT;  Surgeon: Hollice Espy, MD;  Location: [facility] OR[facility];  Service: Urology;  Laterality: Left;  . DENTAL SURGERY     in the [employer]  . TONSILLECTOMY     and adenoids  . URETEROSCOPY WITH HOLMIUM LASER LITHOTRIPSY Left 07/02/2014   Procedure: URETEROSCOPY WITH HOLMIUM LASER LITHOTRIPSY;  Surgeon: Hollice Espy, MD;  Location: [facility] ORS;  Service: Urology;  Laterality: Left;    Home Medications:  Allergies as of 09/29/2016      Reactions   Tape Rash   Latex Itching   Sulfa Antibiotics Nausea And Vomiting, Other (See Comments)   Zoster Vaccine Live Hives, Rash   localized Localized; Vaccine for shingles localized Localized; Vaccine for shingles      Medication List       Accurate as of 09/29/16  3:00 PM. Always use your most recent med list.          acetaminophen 500 MG tablet Commonly known as:  TYLENOL Take by  mouth.   aspirin EC 81 MG tablet Take 81 mg by mouth daily.   aspirin EC 81 MG tablet Take by mouth.   azelastine 0.1 % nasal spray Commonly known as:  ASTELIN Place into the nose.   azelastine 0.1 % nasal spray Commonly known as:  ASTELIN PLACE 1 SPRAY INTO BOTH NOSTRILS TWO TIMES DAILY.   BAYER CONTOUR TEST test strip Generic drug:  glucose blood   busPIRone 10 MG tablet Commonly known as:  BUSPAR Take by mouth.   ELIDEL 1 % cream Generic drug:  pimecrolimus Apply 1 application topically 2 (two) times daily as needed. Reported on 06/25/2015   finasteride 5 MG tablet Commonly known as:  PROSCAR Take 1 tablet (5 mg total) by mouth daily.   flecainide 100 MG tablet Commonly known as:  TAMBOCOR Take 100 mg by mouth 2 (two) times daily.   flecainide 100 MG tablet Commonly known as:  TAMBOCOR Take by mouth.   fluticasone 50 MCG/ACT nasal spray Commonly known as:  FLONASE Place into the nose. Reported on 06/25/2015   glimepiride 2 MG tablet Commonly known as:  AMARYL Take 1 mg by mouth every other day.   GLUCOSAMINE CHOND COMPLEX/MSM Tabs Reported on 06/25/2015   HYDROcodone-acetaminophen 5-325 MG tablet Commonly known as:  NORCO/VICODIN Take by mouth.   HYDROcodone-acetaminophen 5-325 MG tablet Commonly known as:  NORCO/VICODIN Take 1-2 tablets by mouth every 6 (six) hours as needed for moderate pain.   ipratropium 0.06 % nasal spray Commonly known as:  ATROVENT Place into the nose.   loperamide 2 MG tablet Commonly known as:  IMODIUM A-D Take by mouth.   lovastatin 40 MG tablet Commonly known as:  MEVACOR Take 40 mg by mouth at bedtime.   lovastatin 40 MG tablet Commonly known as:  MEVACOR TAKE 1 TABLET (40 MG TOTAL) BY MOUTH ONCE DAILY.   metFORMIN 500 MG tablet Commonly known as:  GLUCOPHAGE Take 500 mg by mouth 3 (three) times daily.   metFORMIN 500 MG tablet Commonly known as:  GLUCOPHAGE Take by mouth.   metoprolol tartrate 25 MG  tablet Commonly known as:  LOPRESSOR Take 12.5 mg by mouth 2 (two) times daily. Reported on 06/25/2015   metoprolol tartrate 50 MG tablet Commonly known as:  LOPRESSOR TAKE 0.5 TABLETS (25 MG TOTAL) BY MOUTH NIGHTLY.   metoprolol tartrate 25 MG tablet Commonly known as:  LOPRESSOR Take by mouth.   potassium chloride 10 MEQ tablet Commonly known as:  K-DUR 10 mEq.  Take 10 mEq by mouth daily.   SUNMARK GLUCOSE 4 GM chewable tablet Generic drug:  glucose Reported on 06/25/2015   tadalafil 5 MG tablet Commonly known as:  CIALIS Take 1 tablet (5 mg total) by mouth daily as needed for erectile dysfunction.   tamsulosin 0.4 MG Caps capsule Commonly known as:  FLOMAX Take 0.4 mg by mouth daily.   tamsulosin 0.4 MG Caps capsule Commonly known as:  FLOMAX TAKE 1 CAPSULE (0.4 MG TOTAL) BY MOUTH ONCE DAILY. TAKE 30 MINUTES AFTER SAME MEAL EACH DAY.   torsemide 5 MG tablet Commonly known as:  DEMADEX Take by mouth.   torsemide 5 MG tablet Commonly known as:  DEMADEX Take by mouth.   triamcinolone ointment 0.1 % Commonly known as:  KENALOG Apply 1 application topically 2 (two) times daily as needed.   VITAMIN B-12 IJ Inject as directed every 30 (thirty) days.   vitamin B-12 1000 MCG tablet Commonly known as:  CYANOCOBALAMIN Take by mouth.   Vitamin D (Ergocalciferol) 50000 units Caps capsule Commonly known as:  DRISDOL Take by mouth.   VITAMIN D-1000 MAX ST 1000 units tablet Generic drug:  Cholecalciferol Take by mouth.            Discharge Care Instructions        Start     Ordered   09/29/16 0000  finasteride (PROSCAR) 5 MG tablet  Daily    Question:  Supervising Provider  Answer:  Hollice Espy   09/29/16 1458   09/29/16 0000  tadalafil (CIALIS) 5 MG tablet  Daily PRN    Question:  Supervising Provider  Answer:  Hollice Espy   09/29/16 1459      Allergies:  Allergies  Allergen Reactions  . Tape Rash  . Latex Itching  . Sulfa Antibiotics  Nausea And Vomiting and Other (See Comments)  . Zoster Vaccine Live Hives and Rash    localized Localized; Vaccine for shingles localized Localized; Vaccine for shingles    Family History: Family History  Problem Relation Age of Onset  . Kidney disease Mother   . Prostate cancer Neg Hx   . Kidney cancer Neg Hx   . Bladder Cancer Neg Hx     Social History:  reports that he quit smoking about 28 years ago. He has never used smokeless tobacco. He reports that he drinks alcohol. He reports that he does not use drugs.  ROS: UROLOGY Frequent Urination?: Yes Hard to postpone urination?: Yes Burning/pain with urination?: No Get up at night to urinate?: Yes Leakage of urine?: Yes Urine stream starts and stops?: No Trouble starting stream?: No Do you have to strain to urinate?: No Blood in urine?: No Urinary tract infection?: No Sexually transmitted disease?: No Injury to kidneys or bladder?: No Painful intercourse?: No Weak stream?: Yes Erection problems?: No Penile pain?: No  Gastrointestinal Nausea?: No Vomiting?: No Indigestion/heartburn?: No Diarrhea?: No Constipation?: No  Constitutional Fever: No Night sweats?: No Weight loss?: No Fatigue?: No  Skin Skin rash/lesions?: No Itching?: No  Eyes Blurred vision?: No Double vision?: No  Ears/Nose/Throat Sore throat?: No Sinus problems?: No  Hematologic/Lymphatic Swollen glands?: No Easy bruising?: No  Cardiovascular Leg swelling?: No Chest pain?: No  Respiratory Cough?: No Shortness of breath?: No  Endocrine Excessive thirst?: No  Musculoskeletal Back pain?: No Joint pain?: No  Neurological Headaches?: No Dizziness?: No  Psychologic Depression?: No Anxiety?: No  Physical Exam: BP 137/77   Pulse 76   Ht 5' 10.5" (1.791 m)  Wt 227 lb 6.4 oz (103.1 kg)   BMI 32.17 kg/m   Constitutional: Well nourished. Alert and oriented, No acute distress. HEENT: [place] AT, moist mucus membranes.  Trachea midline, no masses. Cardiovascular: No clubbing, cyanosis, or edema. Respiratory: Normal respiratory effort, no increased work of breathing. GI: Abdomen is soft, non tender, non distended, no abdominal masses. Liver and spleen not palpable.  No hernias appreciated.  Stool sample for occult testing is not indicated.   GU: No CVA tenderness.  No bladder fullness or masses.  Patient with uncircumcised phallus. Foreskin easily retracted Urethral meatus is patent.  No penile discharge. No penile lesions or rashes. Scrotum without lesions, cysts, rashes and/or edema.  Testicles are located scrotally bilaterally. No masses are appreciated in the testicles. Left and right epididymis are normal. Rectal: Patient with  normal sphincter tone. Anus and perineum without scarring or rashes. No rectal masses are appreciated. Prostate is approximately 60 grams, irregular, no nodules are appreciated. Seminal vesicles are normal. Skin: No rashes, bruises or suspicious lesions. Lymph: No cervical or inguinal adenopathy. Neurologic: Grossly intact, no focal deficits, moving all 4 extremities. Psychiatric: Normal mood and affect.  Laboratory Data: Lab Results  Component Value Date   WBC 7.6 07/19/2016   HGB 15.8 07/19/2016   HCT 48.2 07/19/2016   MCV 85.6 07/19/2016   PLT 199 07/19/2016    Lab Results  Component Value Date   CREATININE 1.21 07/19/2016    Results for orders placed or performed during the hospital encounter of 21/30/86  Basic metabolic panel  Result Value Ref Range   Sodium 137 135 - 145 mmol/L   Potassium 4.4 3.5 - 5.1 mmol/L   Chloride 101 101 - 111 mmol/L   CO2 29 22 - 32 mmol/L   Glucose, Bld 240 (H) 65 - 99 mg/dL   BUN 24 (H) 6 - 20 mg/dL   Creatinine, Ser 1.21 0.61 - 1.24 mg/dL   Calcium 9.8 8.9 - 10.3 mg/dL   GFR calc non Af Amer 54 (L) >60 mL/min   GFR calc Af Amer >60 >60 mL/min   Anion gap 7 5 - 15  CBC  Result Value Ref Range   WBC 7.6 3.8 - 10.6 K/uL   RBC 5.63  4.40 - 5.90 MIL/uL   Hemoglobin 15.8 13.0 - 18.0 g/dL   HCT 48.2 40.0 - 52.0 %   MCV 85.6 80.0 - 100.0 fL   MCH 28.0 26.0 - 34.0 pg   MCHC 32.8 32.0 - 36.0 g/dL   RDW 14.2 11.5 - 14.5 %   Platelets 199 150 - 440 K/uL  Troponin I  Result Value Ref Range   Troponin I <0.03 <0.03 ng/mL   I have reviewed the labs  Assessment & Plan:    1. BPH with LUTS  - IPSS score is 16/3, it is improved  - Continue conservative management, avoiding bladder irritants and timed voiding's  - Continue Cialis 5 mg daily, tamsulosin 0.4 mg daily and finasteride 5 mg daily  - RTC in 12 months for I PSS and exam     2. History of nephrolithiasis:     - no flank pain or hematuria  - KUB deferred today as he has an appointment with his accountant   3. History of urinary retention:   Resolved.     Return in about 1 year (around 09/29/2017) for I PSS and exam.  These notes generated with voice recognition software. I apologize for typographical errors.  Novi Calia, PA-C  [facility] [address], [place], [place] [address] [phone]

## 2016-09-29 ENCOUNTER — Ambulatory Visit: Payer: Medicare Other | Admitting: Urology

## 2016-09-29 ENCOUNTER — Encounter: Payer: Self-pay | Admitting: Urology

## 2016-09-29 ENCOUNTER — Ambulatory Visit (INDEPENDENT_AMBULATORY_CARE_PROVIDER_SITE_OTHER): Payer: Medicare Other | Admitting: Urology

## 2016-09-29 VITALS — BP 137/77 | HR 76 | Ht 70.5 in | Wt 227.4 lb

## 2016-09-29 DIAGNOSIS — N138 Other obstructive and reflux uropathy: Secondary | ICD-10-CM | POA: Diagnosis not present

## 2016-09-29 DIAGNOSIS — N401 Enlarged prostate with lower urinary tract symptoms: Secondary | ICD-10-CM | POA: Diagnosis not present

## 2016-09-29 DIAGNOSIS — Z87898 Personal history of other specified conditions: Secondary | ICD-10-CM

## 2016-09-29 MED ORDER — TADALAFIL 5 MG PO TABS: 5.0000 mg | ORAL_TABLET | Freq: Every day | ORAL | 11 refills | Status: DC | PRN

## 2016-09-29 MED ORDER — TAMSULOSIN HCL 0.4 MG PO CAPS
0.4000 mg | ORAL_CAPSULE | Freq: Every day | ORAL | 11 refills | Status: DC
Start: 2016-09-29 — End: 2017-10-28

## 2016-09-29 MED ORDER — FINASTERIDE 5 MG PO TABS: 5.0000 mg | ORAL_TABLET | Freq: Every day | ORAL | 11 refills | Status: DC

## 2016-10-02 ENCOUNTER — Telehealth: Payer: Self-pay | Admitting: Urology

## 2016-10-02 NOTE — Telephone Encounter (Signed)
Patient is calling said he needs a prior auth for his cilias. Please return his call at [facility]

## 2016-10-09 NOTE — Telephone Encounter (Signed)
PA sent to Cover My Meds. Waiting on response.

## 2016-10-15 ENCOUNTER — Telehealth: Payer: Self-pay

## 2016-10-15 NOTE — Telephone Encounter (Signed)
PA for cialis 5mg  has been APPROVED!

## 2016-12-17 ENCOUNTER — Ambulatory Visit: Payer: Private Health Insurance - Indemnity | Admitting: Podiatry

## 2016-12-24 ENCOUNTER — Ambulatory Visit (INDEPENDENT_AMBULATORY_CARE_PROVIDER_SITE_OTHER): Payer: Medicare Other | Admitting: Podiatry

## 2016-12-24 ENCOUNTER — Encounter: Payer: Self-pay | Admitting: Podiatry

## 2016-12-24 DIAGNOSIS — E1142 Type 2 diabetes mellitus with diabetic polyneuropathy: Secondary | ICD-10-CM

## 2016-12-24 DIAGNOSIS — M79609 Pain in unspecified limb: Secondary | ICD-10-CM

## 2016-12-24 DIAGNOSIS — L608 Other nail disorders: Secondary | ICD-10-CM

## 2016-12-24 DIAGNOSIS — B351 Tinea unguium: Secondary | ICD-10-CM

## 2016-12-24 NOTE — Progress Notes (Signed)
Complaint:  Visit Type: Patient returns to my office for continued preventative foot care services. Complaint: Patient states" my nails have grown long and thick and become painful to walk and wear shoes" Patient has been diagnosed with DM with neuropathy.. The patient presents for preventative foot care services. No changes to ROS  Podiatric Exam: Vascular: dorsalis pedis and posterior tibial pulses are palpable bilateral. Capillary return is immediate. Temperature gradient is WNL. Skin turgor WNL  Sensorium: Diminished  Semmes Weinstein monofilament test. Normal tactile sensation bilaterally. Nail Exam: Pt has thick disfigured discolored nails with subungual debris noted bilateral entire nail hallux through fifth toenails Ulcer Exam: There is no evidence of ulcer or pre-ulcerative changes or infection. Orthopedic Exam: Muscle tone and strength are WNL. No limitations in general ROM. No crepitus or effusions noted. Foot type and digits show no abnormalities. Bony prominences are unremarkable. Skin: No Porokeratosis. No infection or ulcers  Diagnosis:  Onychomycosis, , Pain in right toe, pain in left toes  Treatment & Plan Procedures and Treatment: Consent by patient was obtained for treatment procedures. The patient understood the discussion of treatment and procedures well. All questions were answered thoroughly reviewed. Debridement of mycotic and hypertrophic toenails, 1 through 5 bilateral and clearing of subungual debris. No ulceration, no infection noted.  Return Visit-Office Procedure: Patient instructed to return to the office for a follow up visit 3 months for continued evaluation and treatment.    Glendon Dunwoody DPM 

## 2017-03-25 ENCOUNTER — Ambulatory Visit: Payer: Private Health Insurance - Indemnity | Admitting: Podiatry

## 2017-06-25 DIAGNOSIS — D329 Benign neoplasm of meninges, unspecified: Secondary | ICD-10-CM | POA: Insufficient documentation

## 2017-09-27 ENCOUNTER — Telehealth: Payer: Self-pay | Admitting: Urology

## 2017-09-27 DIAGNOSIS — N401 Enlarged prostate with lower urinary tract symptoms: Secondary | ICD-10-CM

## 2017-09-27 NOTE — Telephone Encounter (Signed)
Can pt get enough Finasteride to last til appt on 9/24.  Maybe 1 week worth?

## 2017-09-29 ENCOUNTER — Ambulatory Visit: Payer: Medicare Other | Admitting: Urology

## 2017-09-29 MED ORDER — FINASTERIDE 5 MG PO TABS: 5.0000 mg | ORAL_TABLET | Freq: Every day | ORAL | 0 refills | Status: DC

## 2017-09-29 NOTE — Addendum Note (Signed)
Addended by: Garnette Gunner on: 09/29/2017 01:49 PM   Modules accepted: Orders

## 2017-10-05 ENCOUNTER — Ambulatory Visit: Payer: Medicare HMO | Admitting: Urology

## 2017-10-05 ENCOUNTER — Encounter: Payer: Self-pay | Admitting: Urology

## 2017-10-05 VITALS — BP 124/74 | HR 69 | Ht 70.0 in | Wt 225.0 lb

## 2017-10-05 DIAGNOSIS — Z87898 Personal history of other specified conditions: Secondary | ICD-10-CM

## 2017-10-05 DIAGNOSIS — N138 Other obstructive and reflux uropathy: Secondary | ICD-10-CM

## 2017-10-05 DIAGNOSIS — N529 Male erectile dysfunction, unspecified: Secondary | ICD-10-CM | POA: Diagnosis not present

## 2017-10-05 DIAGNOSIS — N401 Enlarged prostate with lower urinary tract symptoms: Secondary | ICD-10-CM | POA: Diagnosis not present

## 2017-10-05 DIAGNOSIS — Z87442 Personal history of urinary calculi: Secondary | ICD-10-CM

## 2017-10-05 MED ORDER — ALPROSTADIL (VASODILATOR) 1000 MCG UR PLLT: 1000.0000 ug | PELLET | URETHRAL | 12 refills | Status: DC | PRN

## 2017-10-05 MED ORDER — FINASTERIDE 5 MG PO TABS: 5.0000 mg | ORAL_TABLET | Freq: Every day | ORAL | 3 refills | Status: DC

## 2017-10-05 NOTE — Progress Notes (Signed)
10/05/2017 3:31 PM   Jared Ball [birth year] [id number]  Referring provider: Kirk Ruths, MD [facility] [place], [place] [address]  No chief complaint on file.   HPI: Patient is an 84 year old Caucasian male who presents today for a follow up for BPH with LUTS and a history of nephrolithiasis.  BPH WITH LUTS His IPSS score today is 12, which is moderate lower urinary tract symptomatology.  He is mixed with his quality life due to his urinary symptoms.  His previous I PSS score was 16/3.  His major complaint(s) today are/is incontinence and a weak urinary stream.   He has had these symptoms for several years.  He denies any dysuria, hematuria or suprapubic pain.  He currently taking Cialis 5 mg daily and finasteride 5 mg daily.   He also denies any recent fevers, chills, nausea or vomiting.  He does not have a family history of PCa. IPSS    Row Name 10/05/17 1500         International Prostate Symptom Score   How often have you had the sensation of not emptying your bladder?  About half the time     How often have you had to urinate less than every two hours?  Less than half the time     How often have you found you stopped and started again several times when you urinated?  Less than 1 in 5 times     How often have you found it difficult to postpone urination?  Less than half the time     How often have you had a weak urinary stream?  Less than half the time     How often have you had to strain to start urination?  Less than 1 in 5 times     How many times did you typically get up at night to urinate?  1 Time     Total IPSS Score  12       Quality of Life due to urinary symptoms   If you were to spend the rest of your life with your urinary condition just the way it is now how would you feel about that?  Mixed        Score:  1-7 Mild 8-19 Moderate 20-35 Severe    History of nephrolithiasis We had last seen the patient in 08/2014  for 2 non obstructing stones, measuring up to 11 mm for which he underwent a left URS/LL/ureteral sent and stent removal.  He did experience postoperative retention   KUB taken 08/2015 does not demonstrate any urinary stones.  He has not had any passage of fragments or flank pain.  He has not had blood in his urine.   PMH: Past Medical History:  Diagnosis Date  . Anxiety   . Arthritis   . Chronic kidney disease   . Diabetes mellitus without complication ([place])   . Dysrhythmia   . GERD (gastroesophageal reflux disease)   . Headache   . Hypertension   . Shortness of breath dyspnea   . Spinal stenosis     Surgical History: Past Surgical History:  Procedure Laterality Date  . COLONOSCOPY    . CYSTOSCOPY W/ RETROGRADES Bilateral 07/02/2014   Procedure: CYSTOSCOPY WITH RETROGRADE PYELOGRAM;  Surgeon: Hollice Espy, MD;  Location: [facility] OR[facility];  Service: Urology;  Laterality: Bilateral;  . CYSTOSCOPY WITH STENT PLACEMENT Left 07/02/2014   Procedure: CYSTOSCOPY WITH STENT PLACEMENT;  Surgeon: Caryl Pina  Erlene Quan, MD;  Location: [facility] OR[facility];  Service: Urology;  Laterality: Left;  . DENTAL SURGERY     in the [employer]  . TONSILLECTOMY     and adenoids  . URETEROSCOPY WITH HOLMIUM LASER LITHOTRIPSY Left 07/02/2014   Procedure: URETEROSCOPY WITH HOLMIUM LASER LITHOTRIPSY;  Surgeon: Hollice Espy, MD;  Location: [facility] OR[facility];  Service: Urology;  Laterality: Left;    Home Medications:  Allergies as of 10/05/2017      Reactions   Tape Rash   Latex Itching   Sulfa Antibiotics Nausea And Vomiting, Other (See Comments)   Zoster Vaccine Live Hives, Rash   localized Localized; Vaccine for shingles localized Localized; Vaccine for shingles      Medication List        Accurate as of 10/05/17  3:31 PM. Always use your most recent med list.          acetaminophen 500 MG tablet Commonly known as:  TYLENOL Take by mouth.   alprostadil 1000 MCG pellet Commonly known as:  MUSE 1 each (1,000 mcg total) by  Transurethral route as needed for erectile dysfunction. use no more than 3 times per week   aspirin EC 81 MG tablet Take 81 mg by mouth daily.   aspirin EC 81 MG tablet Take by mouth.   azelastine 0.1 % nasal spray Commonly known as:  ASTELIN Place into the nose.   azelastine 0.1 % nasal spray Commonly known as:  ASTELIN PLACE 1 SPRAY INTO BOTH NOSTRILS TWO TIMES DAILY.   BAYER CONTOUR TEST test strip Generic drug:  glucose blood   ELIDEL 1 % cream Generic drug:  pimecrolimus Apply 1 application topically 2 (two) times daily as needed. Reported on 06/25/2015   finasteride 5 MG tablet Commonly known as:  PROSCAR Take 1 tablet (5 mg total) by mouth daily.   flecainide 100 MG tablet Commonly known as:  TAMBOCOR Take 100 mg by mouth 2 (two) times daily.   flecainide 100 MG tablet Commonly known as:  TAMBOCOR Take by mouth.   fluticasone 50 MCG/ACT nasal spray Commonly known as:  FLONASE Place into the nose. Reported on 06/25/2015   glimepiride 2 MG tablet Commonly known as:  AMARYL Take 1 mg by mouth every other day.   GLUCOSAMINE CHOND COMPLEX/MSM Tabs Reported on 06/25/2015   HYDROcodone-acetaminophen 5-325 MG tablet Commonly known as:  NORCO/VICODIN Take by mouth.   HYDROcodone-acetaminophen 5-325 MG tablet Commonly known as:  NORCO/VICODIN Take 1-2 tablets by mouth every 6 (six) hours as needed for moderate pain.   ipratropium 0.06 % nasal spray Commonly known as:  ATROVENT Place into the nose.   isosorbide mononitrate 30 MG 24 hr tablet Commonly known as:  IMDUR Take 30 mg by mouth daily.   loperamide 2 MG tablet Commonly known as:  IMODIUM A-D Take by mouth.   lovastatin 40 MG tablet Commonly known as:  MEVACOR Take 40 mg by mouth at bedtime.   lovastatin 40 MG tablet Commonly known as:  MEVACOR TAKE 1 TABLET (40 MG TOTAL) BY MOUTH ONCE DAILY.   metFORMIN 500 MG tablet Commonly known as:  GLUCOPHAGE Take 500 mg by mouth 3 (three) times  daily.   metFORMIN 500 MG tablet Commonly known as:  GLUCOPHAGE Take by mouth.   metoprolol tartrate 25 MG tablet Commonly known as:  LOPRESSOR Take 12.5 mg by mouth 2 (two) times daily. Reported on 06/25/2015   metoprolol tartrate 50 MG tablet Commonly known as:  LOPRESSOR TAKE 0.5 TABLETS (25 MG  TOTAL) BY MOUTH NIGHTLY.   potassium chloride 10 MEQ tablet Commonly known as:  K-DUR 10 mEq. Take 10 mEq by mouth daily.   SUNMARK GLUCOSE 4 GM chewable tablet Generic drug:  glucose Reported on 06/25/2015   tadalafil 5 MG tablet Commonly known as:  CIALIS Take 1 tablet (5 mg total) by mouth daily as needed for erectile dysfunction.   tamsulosin 0.4 MG Caps capsule Commonly known as:  FLOMAX Take 1 capsule (0.4 mg total) by mouth daily.   torsemide 5 MG tablet Commonly known as:  DEMADEX Take by mouth.   torsemide 5 MG tablet Commonly known as:  DEMADEX Take by mouth.   triamcinolone ointment 0.1 % Commonly known as:  KENALOG Apply 1 application topically 2 (two) times daily as needed.   VITAMIN B-12 IJ Inject as directed every 30 (thirty) days.   VITAMIN D-1000 MAX ST 1000 units tablet Generic drug:  Cholecalciferol Take by mouth.       Allergies:  Allergies  Allergen Reactions  . Tape Rash  . Latex Itching  . Sulfa Antibiotics Nausea And Vomiting and Other (See Comments)  . Zoster Vaccine Live Hives and Rash    localized Localized; Vaccine for shingles localized Localized; Vaccine for shingles    Family History: Family History  Problem Relation Age of Onset  . Kidney disease Mother   . Prostate cancer Neg Hx   . Kidney cancer Neg Hx   . Bladder Cancer Neg Hx     Social History:  reports that he quit smoking about 29 years ago. He has never used smokeless tobacco. He reports that he drinks alcohol. He reports that he does not use drugs.  ROS: UROLOGY Frequent Urination?: No Hard to postpone urination?: No Burning/pain with urination?: No Get  up at night to urinate?: No Leakage of urine?: Yes Urine stream starts and stops?: No Trouble starting stream?: No Do you have to strain to urinate?: No Blood in urine?: No Urinary tract infection?: No Sexually transmitted disease?: No Injury to kidneys or bladder?: No Painful intercourse?: No Weak stream?: Yes Erection problems?: Yes Penile pain?: No  Gastrointestinal Nausea?: No Vomiting?: No Indigestion/heartburn?: Yes Diarrhea?: Yes Constipation?: No  Constitutional Fever: No Night sweats?: No Weight loss?: No Fatigue?: No  Skin Skin rash/lesions?: No Itching?: No  Eyes Blurred vision?: No Double vision?: No  Ears/Nose/Throat Sore throat?: No Sinus problems?: No  Hematologic/Lymphatic Swollen glands?: No Easy bruising?: Yes  Cardiovascular Leg swelling?: Yes Chest pain?: No  Respiratory Cough?: No Shortness of breath?: Yes  Endocrine Excessive thirst?: No  Musculoskeletal Back pain?: No Joint pain?: No  Neurological Headaches?: No Dizziness?: No  Psychologic Depression?: No Anxiety?: Yes  Physical Exam: BP 124/74 (BP Location: Right Arm, Patient Position: Sitting, Cuff Size: Normal)   Pulse 69   Ht 5\' 10"  (1.778 m)   Wt 225 lb (102.1 kg)   BMI 32.28 kg/m   Constitutional: Well nourished. Alert and oriented, No acute distress. HEENT: [place] AT, moist mucus membranes. Trachea midline, no masses. Cardiovascular: No clubbing, cyanosis, or edema. Respiratory: Normal respiratory effort, no increased work of breathing. GI: Abdomen is soft, non tender, non distended, no abdominal masses. Liver and spleen not palpable.  No hernias appreciated.  Stool sample for occult testing is not indicated.   GU: No CVA tenderness.  No bladder fullness or masses.  Patient with uncircumcised phallus.   Foreskin easily retracted Urethral meatus is patent.  No penile discharge. No penile lesions or rashes. Scrotum without  lesions, cysts, rashes and/or edema.   Testicles are located scrotally bilaterally. No masses are appreciated in the testicles. Left and right epididymis are normal. Rectal: Patient with  normal sphincter tone. Anus and perineum without scarring or rashes. No rectal masses are appreciated. Prostate is approximately could only palpate the apex, no nodules are appreciated. Skin: No rashes, bruises or suspicious lesions. Lymph: No cervical or inguinal adenopathy. Neurologic: Grossly intact, no focal deficits, moving all 4 extremities. Psychiatric: Normal mood and affect.  Laboratory Data: Lab Results  Component Value Date   WBC 7.6 07/19/2016   HGB 15.8 07/19/2016   HCT 48.2 07/19/2016   MCV 85.6 07/19/2016   PLT 199 07/19/2016    Lab Results  Component Value Date   CREATININE 1.21 07/19/2016    Results for orders placed or performed during the hospital encounter of 75/88/32  Basic metabolic panel  Result Value Ref Range   Sodium 137 135 - 145 mmol/L   Potassium 4.4 3.5 - 5.1 mmol/L   Chloride 101 101 - 111 mmol/L   CO2 29 22 - 32 mmol/L   Glucose, Bld 240 (H) 65 - 99 mg/dL   BUN 24 (H) 6 - 20 mg/dL   Creatinine, Ser 1.21 0.61 - 1.24 mg/dL   Calcium 9.8 8.9 - 10.3 mg/dL   GFR calc non Af Amer 54 (L) >60 mL/min   GFR calc Af Amer >60 >60 mL/min   Anion gap 7 5 - 15  CBC  Result Value Ref Range   WBC 7.6 3.8 - 10.6 K/uL   RBC 5.63 4.40 - 5.90 MIL/uL   Hemoglobin 15.8 13.0 - 18.0 g/dL   HCT 48.2 40.0 - 52.0 %   MCV 85.6 80.0 - 100.0 fL   MCH 28.0 26.0 - 34.0 pg   MCHC 32.8 32.0 - 36.0 g/dL   RDW 14.2 11.5 - 14.5 %   Platelets 199 150 - 440 K/uL  Troponin I  Result Value Ref Range   Troponin I <0.03 <0.03 ng/mL   I have reviewed the labs  Assessment & Plan:    1. BPH with LUTS IPSS score is 12/3, it is improved Continue conservative management, avoiding bladder irritants and timed voiding's Discontinue Cialis 5 mg daily as he was started on nitrates Continue tamsulosin 0.4 mg daily and finasteride 5 mg  daily RTC in 12 months for I PSS and exam     2. History of nephrolithiasis:    No flank pain or hematuria KUB deferred today   3. History of urinary retention:   Resolved.    4. ED Can no longer take the Cialis 5 mg - discussed intracavernousal injection and MUSE - he would like to see what the script of MUSE would cost him - if it not cost prohibitive - he would like to try it    Return in about 1 year (around 10/06/2018) for I PSS and exam .  These notes generated with voice recognition software. I apologize for typographical errors.  Zara Council, PA-C  [facility] [address] [place], [place] [address] [phone]

## 2017-10-28 ENCOUNTER — Other Ambulatory Visit: Payer: Self-pay | Admitting: Urology

## 2017-11-08 ENCOUNTER — Ambulatory Visit: Payer: Private Health Insurance - Indemnity | Admitting: Podiatry

## 2018-02-23 ENCOUNTER — Encounter: Admission: RE | Admit: 2018-02-23 | Payer: Medicare HMO | Source: Ambulatory Visit | Admitting: Internal Medicine

## 2018-10-04 NOTE — Progress Notes (Signed)
10/05/2018 9:38 AM   Jared Ball [birth year] [id number]  Referring provider: Kirk Ruths, MD [facility] [place],  Jared Ball [id number]  Chief Complaint  Patient presents with  . Benign Prostatic Hypertrophy    HPI: Patient is an 85 year old male who presents today for a follow up for BPH with LUTS and a history of nephrolithiasis.  BPH WITH LUTS His IPSS score today is 14, which is moderate lower urinary tract symptomatology.  He is mostly dissatisfied with his quality life due to his urinary symptoms.  His previous I PSS score was 12/3.  His major complaint(s) today are/is urgency, incontinence, intermittency, hesitancy and a weak urinary stream.  He has had these symptoms for several years.  He denies any dysuria, hematuria or suprapubic pain.  He currently taking tamsulosin 0.4 mg daily and finasteride 5 mg daily.   Patient denies any gross hematuria, dysuria or suprapubic/flank pain.  Patient denies any fevers, chills, nausea or vomiting.   IPSS    Row Name 10/05/18 1500         International Prostate Symptom Score   How often have you had the sensation of not emptying your bladder?  About half the time     How often have you had to urinate less than every two hours?  About half the time     How often have you found you stopped and started again several times when you urinated?  Less than half the time     How often have you found it difficult to postpone urination?  Less than half the time     How often have you had a weak urinary stream?  Less than half the time     How often have you had to strain to start urination?  Less than 1 in 5 times     How many times did you typically get up at night to urinate?  1 Time     Total IPSS Score  14       Quality of Life due to urinary symptoms   If you were to spend the rest of your life with your urinary condition just the way it is now how would you feel about that?  Mostly Disatisfied         Score:  1-7 Mild 8-19 Moderate 20-35 Severe    History of nephrolithiasis Underwent left URS in 08/2014 for 2 non obstructing stones, measuring up to 11 mm.  KUB taken 08/2015 does not demonstrate any urinary stones. No recent passage of fragments or flank pain.   PMH: Past Medical History:  Diagnosis Date  . Anxiety   . Arthritis   . Chronic kidney disease   . Diabetes mellitus without complication ([place])   . Dysrhythmia   . GERD (gastroesophageal reflux disease)   . Headache   . Hypertension   . Shortness of breath dyspnea   . Spinal stenosis     Surgical History: Past Surgical History:  Procedure Laterality Date  . COLONOSCOPY    . CYSTOSCOPY W/ RETROGRADES Bilateral 07/02/2014   Procedure: CYSTOSCOPY WITH RETROGRADE PYELOGRAM;  Surgeon: Hollice Espy, MD;  Location: [facility] ORS;  Service: Urology;  Laterality: Bilateral;  . CYSTOSCOPY WITH STENT PLACEMENT Left 07/02/2014   Procedure: CYSTOSCOPY WITH STENT PLACEMENT;  Surgeon: Hollice Espy, MD;  Location: [facility] OR[facility];  Service: Urology;  Laterality: Left;  . DENTAL SURGERY     in the [employer]  .  TONSILLECTOMY     and adenoids  . URETEROSCOPY WITH HOLMIUM LASER LITHOTRIPSY Left 07/02/2014   Procedure: URETEROSCOPY WITH HOLMIUM LASER LITHOTRIPSY;  Surgeon: Hollice Espy, MD;  Location: [facility] OR[facility];  Service: Urology;  Laterality: Left;    Home Medications:  Allergies as of 10/05/2018      Reactions   Tape Rash   Latex Itching   Sulfa Antibiotics Nausea And Vomiting, Other (See Comments)   Zoster Vaccine Live Hives, Rash   localized Localized; Vaccine for shingles localized Localized; Vaccine for shingles      Medication List       Accurate as of October 05, 2018 11:59 PM. If you have any questions, ask your nurse or doctor.        STOP taking these medications   azelastine 0.1 % nasal spray Commonly known as: ASTELIN Stopped by: Zara Council, PA-C   HYDROcodone-acetaminophen 5-325 MG tablet Commonly  known as: NORCO/VICODIN Stopped by: Rommel Hogston, PA-C   metoprolol tartrate 25 MG tablet Commonly known as: LOPRESSOR Stopped by: Lakendrick Paradis, PA-C   metoprolol tartrate 50 MG tablet Commonly known as: LOPRESSOR Stopped by: Zara Council, PA-C     TAKE these medications   acetaminophen 500 MG tablet Commonly known as: TYLENOL Take by mouth.   alprostadil 1000 MCG pellet Commonly known as: Muse 1 each (1,000 mcg total) by Transurethral route as needed for erectile dysfunction. use no more than 3 times per week   aspirin EC 81 MG tablet Take 81 mg by mouth daily. What changed: Another medication with the same name was removed. Continue taking this medication, and follow the directions you see here. Changed by: Zara Council, PA-C   Bayer Contour Test test strip Generic drug: glucose blood   Elidel 1 % cream Generic drug: pimecrolimus Apply 1 application topically 2 (two) times daily as needed. Reported on 06/25/2015   fesoterodine 4 MG Tb24 tablet Commonly known as: TOVIAZ Take 1 tablet (4 mg total) by mouth daily. Started by: Zara Council, PA-C   finasteride 5 MG tablet Commonly known as: PROSCAR Take 1 tablet (5 mg total) by mouth daily.   flecainide 100 MG tablet Commonly known as: TAMBOCOR Take 100 mg by mouth 2 (two) times daily. What changed: Another medication with the same name was removed. Continue taking this medication, and follow the directions you see here. Changed by: Brixon Zhen, PA-C   fluticasone 50 MCG/ACT nasal spray Commonly known as: FLONASE Place into the nose. Reported on 06/25/2015   glimepiride 2 MG tablet Commonly known as: AMARYL Take 1 mg by mouth every other day.   Glucosamine Chond Complex/MSM Tabs Reported on 06/25/2015   ipratropium 0.06 % nasal spray Commonly known as: ATROVENT Place into the nose.   isosorbide mononitrate 30 MG 24 hr tablet Commonly known as: IMDUR Take 30 mg by mouth daily.    loperamide 2 MG tablet Commonly known as: IMODIUM A-D Take by mouth.   lovastatin 40 MG tablet Commonly known as: MEVACOR TAKE 1 TABLET (40 MG TOTAL) BY MOUTH ONCE DAILY. What changed: Another medication with the same name was removed. Continue taking this medication, and follow the directions you see here. Changed by: Zara Council, PA-C   metFORMIN 500 MG tablet Commonly known as: GLUCOPHAGE Take 500 mg by mouth 3 (three) times daily.   metFORMIN 500 MG tablet Commonly known as: GLUCOPHAGE Take by mouth.   pantoprazole 40 MG tablet Commonly known as: PROTONIX Take by mouth.   potassium chloride 10  MEQ tablet Commonly known as: KLOR-CON 10 mEq. Take 10 mEq by mouth daily.   ranolazine 500 MG 12 hr tablet Commonly known as: RANEXA Take by mouth.   SUNMARK GLUCOSE 4 GM chewable tablet Generic drug: glucose Reported on 06/25/2015   tadalafil 5 MG tablet Commonly known as: CIALIS Take 1 tablet (5 mg total) by mouth daily as needed for erectile dysfunction.   tamsulosin 0.4 MG Caps capsule Commonly known as: FLOMAX TAKE 1 CAPSULE BY MOUTH EVERY DAY   torsemide 5 MG tablet Commonly known as: DEMADEX Take by mouth. What changed: Another medication with the same name was removed. Continue taking this medication, and follow the directions you see here. Changed by: Zara Council, PA-C   triamcinolone ointment 0.1 % Commonly known as: KENALOG Apply 1 application topically 2 (two) times daily as needed.   VITAMIN B-12 IJ Inject as directed every 30 (thirty) days.   Vitamin D-1000 Max St 25 MCG (1000 UT) tablet Generic drug: Cholecalciferol Take by mouth.       Allergies:  Allergies  Allergen Reactions  . Tape Rash  . Latex Itching  . Sulfa Antibiotics Nausea And Vomiting and Other (See Comments)  . Zoster Vaccine Live Hives and Rash    localized Localized; Vaccine for shingles localized Localized; Vaccine for shingles    Family History: Family  History  Problem Relation Age of Onset  . Kidney disease Mother   . Prostate cancer Neg Hx   . Kidney cancer Neg Hx   . Bladder Cancer Neg Hx     Social History:  reports that he quit smoking about 30 years ago. He has never used smokeless tobacco. He reports current alcohol use. He reports that he does not use drugs.  ROS: UROLOGY Frequent Urination?: No Hard to postpone urination?: Yes Burning/pain with urination?: No Get up at night to urinate?: No Leakage of urine?: Yes Urine stream starts and stops?: Yes Trouble starting stream?: Yes Do you have to strain to urinate?: No Blood in urine?: No Urinary tract infection?: No Sexually transmitted disease?: No Injury to kidneys or bladder?: No Painful intercourse?: No Weak stream?: Yes Erection problems?: Yes Penile pain?: No  Gastrointestinal Nausea?: No Vomiting?: No Indigestion/heartburn?: Yes Diarrhea?: Yes Constipation?: No  Constitutional Fever: No Night sweats?: No Weight loss?: No Fatigue?: No  Skin Skin rash/lesions?: No Itching?: No  Eyes Blurred vision?: No Double vision?: No  Ears/Nose/Throat Sore throat?: No Sinus problems?: Yes  Hematologic/Lymphatic Swollen glands?: No Easy bruising?: No  Cardiovascular Leg swelling?: Yes Chest pain?: No  Respiratory Cough?: No Shortness of breath?: Yes  Endocrine Excessive thirst?: No  Musculoskeletal Back pain?: No Joint pain?: No  Neurological Headaches?: No Dizziness?: No  Psychologic Depression?: No Anxiety?: No  Physical Exam: BP 112/63 (BP Location: Left Arm, Patient Position: Sitting, Cuff Size: Normal)   Pulse 65   Ht 5\' 10"  (1.778 m)   Wt 215 lb 6.4 oz (97.7 kg)   BMI 30.91 kg/m   Constitutional:  Well nourished. Alert and oriented, No acute distress. HEENT: [place] AT, moist mucus membranes.  Trachea midline, no masses. Cardiovascular: No clubbing, cyanosis, or edema. Respiratory: Normal respiratory effort, no increased work  of breathing. GI: Abdomen is soft, non tender, non distended, no abdominal masses. Liver and spleen not palpable.  No hernias appreciated.  Stool sample for occult testing is not indicated.   GU: No CVA tenderness.  No bladder fullness or masses.  Patient with uncircumcised phallus.  Foreskin easily retracted. Urethral  meatus is patent.  No penile discharge. No penile lesions or rashes. Scrotum without lesions, cysts, rashes and/or edema.  Testicles are located scrotally bilaterally. No masses are appreciated in the testicles. Left and right epididymis are normal. Rectal: Patient with  normal sphincter tone. Anus and perineum without scarring or rashes. No rectal masses are appreciated. Prostate is approximately 60 grams, could only palpated the apex and midportion of the gland, no nodules are appreciated. Seminal vesicles could not be palpated.  Skin: No rashes, bruises or suspicious lesions. Lymph: No inguinal adenopathy. Neurologic: Grossly intact, no focal deficits, moving all 4 extremities. Psychiatric: Normal mood and affect.  Laboratory Data: Lab Results  Component Value Date   WBC 7.6 07/19/2016   HGB 15.8 07/19/2016   HCT 48.2 07/19/2016   MCV 85.6 07/19/2016   PLT 199 07/19/2016    Lab Results  Component Value Date   CREATININE 1.21 07/19/2016  I have reviewed the labs.  Pertinent Imaging Results for orders placed or performed in visit on 10/05/18  Bladder Scan (Post Void Residual) in office  Result Value Ref Range   Scan Result 10     Assessment & Plan:    1. BPH with LUTS IPSS score is 14/4, it is worsening Continue conservative management, avoiding bladder irritants and timed voiding's Most bothersome symptoms is rushing to the bathroom Continue tamsulosin 0.4 mg daily and finasteride 5 mg daily Add Toviax 4 mg daily -  I have advised of the side effects, such as: dry eyes, dry mouth, constipation, mental confusion and/or urinary retention RTC in 3 weeks for I PSS  and PVR   2. History of nephrolithiasis:    No flank pain or passage of fragments  3. History of urinary retention:   Resolved.    4. ED Is not sexually active at this time as he and his wife are separated by units at the [facility] and cannot physically interact   Return in about 3 weeks (around 10/26/2018) for IPSS and PVR.  These notes generated with voice recognition software. I apologize for typographical errors.  Zara Council, PA-C  [facility] [address] [place], [place] [address] [phone]

## 2018-10-05 ENCOUNTER — Encounter: Payer: Self-pay | Admitting: Urology

## 2018-10-05 ENCOUNTER — Ambulatory Visit (INDEPENDENT_AMBULATORY_CARE_PROVIDER_SITE_OTHER): Payer: Medicare HMO | Admitting: Urology

## 2018-10-05 ENCOUNTER — Other Ambulatory Visit: Payer: Self-pay

## 2018-10-05 VITALS — BP 112/63 | HR 65 | Ht 70.0 in | Wt 215.4 lb

## 2018-10-05 DIAGNOSIS — N529 Male erectile dysfunction, unspecified: Secondary | ICD-10-CM

## 2018-10-05 DIAGNOSIS — N138 Other obstructive and reflux uropathy: Secondary | ICD-10-CM | POA: Diagnosis not present

## 2018-10-05 DIAGNOSIS — Z87898 Personal history of other specified conditions: Secondary | ICD-10-CM

## 2018-10-05 DIAGNOSIS — Z87442 Personal history of urinary calculi: Secondary | ICD-10-CM | POA: Diagnosis not present

## 2018-10-05 DIAGNOSIS — N401 Enlarged prostate with lower urinary tract symptoms: Secondary | ICD-10-CM

## 2018-10-05 LAB — BLADDER SCAN AMB NON-IMAGING: Scan Result: 10

## 2018-10-05 MED ORDER — FESOTERODINE FUMARATE ER 4 MG PO TB24: 4.0000 mg | ORAL_TABLET | Freq: Every day | ORAL | 0 refills | Status: DC

## 2018-10-25 ENCOUNTER — Telehealth: Payer: Self-pay | Admitting: Urology

## 2018-10-25 NOTE — Telephone Encounter (Signed)
I understand Jared Ball's concern regarding coming to the office during a COVID pandemic, but there are no other medications to try at this time.  He needs to be scheduled for a cystoscopy with one of the physicians for refractory urgency.

## 2018-10-25 NOTE — Telephone Encounter (Signed)
Left message advising patient he needs to have cystoscopy to help determine medication.

## 2018-10-25 NOTE — Telephone Encounter (Signed)
Pt called and states that he has stopped taking Toviaz due to the side effects (constipation and bad taste in his mouth).  He cancelled his follow up appt 10/26/2018, he will do a Virtual if you want to talk to him. He doesn't want to come to office due to [place].

## 2018-10-26 ENCOUNTER — Ambulatory Visit: Payer: Medicare HMO | Admitting: Urology

## 2018-10-29 ENCOUNTER — Other Ambulatory Visit: Payer: Self-pay | Admitting: Urology

## 2018-10-29 DIAGNOSIS — Z87442 Personal history of urinary calculi: Secondary | ICD-10-CM

## 2018-10-30 ENCOUNTER — Other Ambulatory Visit: Payer: Self-pay | Admitting: Urology

## 2019-01-30 DIAGNOSIS — N1831 Chronic kidney disease, stage 3a: Secondary | ICD-10-CM | POA: Insufficient documentation

## 2019-05-10 ENCOUNTER — Encounter: Payer: Self-pay | Admitting: Ophthalmology

## 2019-05-11 ENCOUNTER — Other Ambulatory Visit: Payer: Self-pay

## 2019-05-11 ENCOUNTER — Encounter: Payer: Self-pay | Admitting: Ophthalmology

## 2019-05-15 ENCOUNTER — Other Ambulatory Visit
Admission: RE | Admit: 2019-05-15 | Discharge: 2019-05-15 | Disposition: A | Discharge status: Home / Self Care | Payer: Medicare HMO | Source: Ambulatory Visit | Attending: Ophthalmology | Admitting: Ophthalmology

## 2019-05-15 ENCOUNTER — Other Ambulatory Visit: Payer: Self-pay

## 2019-05-15 DIAGNOSIS — Z01812 Encounter for preprocedural laboratory examination: Secondary | ICD-10-CM | POA: Insufficient documentation

## 2019-05-15 DIAGNOSIS — Z20822 Contact with and (suspected) exposure to covid-19: Secondary | ICD-10-CM | POA: Diagnosis not present

## 2019-05-15 LAB — SARS CORONAVIRUS 2 (TAT 6-24 HRS): SARS Coronavirus 2: NEGATIVE

## 2019-05-16 NOTE — Discharge Instructions (Signed)

## 2019-05-17 ENCOUNTER — Encounter
Admission: RE | Disposition: A | Discharge status: Home / Self Care | Payer: Self-pay | Source: Home / Self Care | Attending: Ophthalmology

## 2019-05-17 ENCOUNTER — Other Ambulatory Visit: Payer: Self-pay

## 2019-05-17 ENCOUNTER — Ambulatory Visit: Payer: Medicare HMO | Admitting: Anesthesiology

## 2019-05-17 ENCOUNTER — Encounter: Payer: Self-pay | Admitting: Ophthalmology

## 2019-05-17 ENCOUNTER — Ambulatory Visit
Admission: RE | Admit: 2019-05-17 | Discharge: 2019-05-17 | Disposition: A | Discharge status: Home / Self Care | Payer: Medicare HMO | Attending: Ophthalmology | Admitting: Ophthalmology

## 2019-05-17 DIAGNOSIS — G4730 Sleep apnea, unspecified: Secondary | ICD-10-CM | POA: Diagnosis not present

## 2019-05-17 DIAGNOSIS — I10 Essential (primary) hypertension: Secondary | ICD-10-CM | POA: Insufficient documentation

## 2019-05-17 DIAGNOSIS — Z87891 Personal history of nicotine dependence: Secondary | ICD-10-CM | POA: Insufficient documentation

## 2019-05-17 DIAGNOSIS — E7800 Pure hypercholesterolemia, unspecified: Secondary | ICD-10-CM | POA: Insufficient documentation

## 2019-05-17 DIAGNOSIS — K589 Irritable bowel syndrome without diarrhea: Secondary | ICD-10-CM | POA: Diagnosis not present

## 2019-05-17 DIAGNOSIS — N400 Enlarged prostate without lower urinary tract symptoms: Secondary | ICD-10-CM | POA: Insufficient documentation

## 2019-05-17 DIAGNOSIS — K219 Gastro-esophageal reflux disease without esophagitis: Secondary | ICD-10-CM | POA: Diagnosis not present

## 2019-05-17 DIAGNOSIS — Z79899 Other long term (current) drug therapy: Secondary | ICD-10-CM | POA: Diagnosis not present

## 2019-05-17 DIAGNOSIS — I4891 Unspecified atrial fibrillation: Secondary | ICD-10-CM | POA: Diagnosis not present

## 2019-05-17 DIAGNOSIS — H2512 Age-related nuclear cataract, left eye: Secondary | ICD-10-CM | POA: Insufficient documentation

## 2019-05-17 DIAGNOSIS — E119 Type 2 diabetes mellitus without complications: Secondary | ICD-10-CM | POA: Diagnosis not present

## 2019-05-17 DIAGNOSIS — Z7984 Long term (current) use of oral hypoglycemic drugs: Secondary | ICD-10-CM | POA: Diagnosis not present

## 2019-05-17 DIAGNOSIS — Z7982 Long term (current) use of aspirin: Secondary | ICD-10-CM | POA: Insufficient documentation

## 2019-05-17 HISTORY — DX: Unspecified hearing loss, unspecified ear: H91.90

## 2019-05-17 HISTORY — DX: Irritable bowel syndrome, unspecified: K58.9

## 2019-05-17 HISTORY — DX: Pure hypercholesterolemia, unspecified: E78.00

## 2019-05-17 HISTORY — DX: Other symptoms and signs involving cognitive functions and awareness: R41.89

## 2019-05-17 HISTORY — DX: Benign prostatic hyperplasia without lower urinary tract symptoms: N40.0

## 2019-05-17 HISTORY — DX: Vitamin D deficiency, unspecified: E55.9

## 2019-05-17 HISTORY — DX: Sleep apnea, unspecified: G47.30

## 2019-05-17 HISTORY — DX: Other specified postprocedural states: Z98.890

## 2019-05-17 HISTORY — DX: Personal history of urinary calculi: Z87.442

## 2019-05-17 HISTORY — DX: Dizziness and giddiness: R42

## 2019-05-17 HISTORY — DX: Bronchitis, not specified as acute or chronic: J40

## 2019-05-17 HISTORY — DX: Unspecified atrial fibrillation: I48.91

## 2019-05-17 HISTORY — PX: CATARACT EXTRACTION W/PHACO: SHX586

## 2019-05-17 HISTORY — DX: Other specified postprocedural states: R11.2

## 2019-05-17 HISTORY — DX: Zoster without complications: B02.9

## 2019-05-17 LAB — GLUCOSE, CAPILLARY: Glucose-Capillary: 121 mg/dL — ABNORMAL HIGH (ref 70–99)

## 2019-05-17 SURGERY — PHACOEMULSIFICATION, CATARACT, WITH IOL INSERTION
Anesthesia: Monitor Anesthesia Care | Site: Eye | Laterality: Left

## 2019-05-17 MED ORDER — NA HYALUR & NA CHOND-NA HYALUR 0.4-0.35 ML IO KIT
PACK | INTRAOCULAR | Status: DC | PRN
  Administered 2019-05-17: 1 mL via INTRAOCULAR

## 2019-05-17 MED ORDER — LIDOCAINE HCL (PF) 2 % IJ SOLN
INTRAOCULAR | Status: DC | PRN
  Administered 2019-05-17: 1 mL

## 2019-05-17 MED ORDER — CEFUROXIME OPHTHALMIC INJECTION 1 MG/0.1 ML
INJECTION | OPHTHALMIC | Status: DC | PRN
  Administered 2019-05-17: 0.1 mL via INTRACAMERAL

## 2019-05-17 MED ORDER — TETRACAINE HCL 0.5 % OP SOLN
1.0000 [drp] | OPHTHALMIC | Status: DC | PRN
  Administered 2019-05-17 (×3): 1 [drp] via OPHTHALMIC

## 2019-05-17 MED ORDER — LACTATED RINGERS IV SOLN: 10.0000 mL/h | INTRAVENOUS | Status: DC

## 2019-05-17 MED ORDER — EPINEPHRINE PF 1 MG/ML IJ SOLN
INTRAOCULAR | Status: DC | PRN
  Administered 2019-05-17: 78 mL via OPHTHALMIC

## 2019-05-17 MED ORDER — MIDAZOLAM HCL 2 MG/2ML IJ SOLN
INTRAMUSCULAR | Status: DC | PRN
  Administered 2019-05-17: .5 mg via INTRAVENOUS

## 2019-05-17 MED ORDER — MOXIFLOXACIN HCL 0.5 % OP SOLN
1.0000 [drp] | OPHTHALMIC | Status: DC | PRN
  Administered 2019-05-17 (×3): 1 [drp] via OPHTHALMIC

## 2019-05-17 MED ORDER — FENTANYL CITRATE (PF) 100 MCG/2ML IJ SOLN
INTRAMUSCULAR | Status: DC | PRN
  Administered 2019-05-17 (×2): 50 ug via INTRAVENOUS

## 2019-05-17 MED ORDER — ARMC OPHTHALMIC DILATING DROPS
1.0000 "application " | OPHTHALMIC | Status: DC | PRN
  Administered 2019-05-17 (×3): 1 via OPHTHALMIC

## 2019-05-17 MED ORDER — BRIMONIDINE TARTRATE-TIMOLOL 0.2-0.5 % OP SOLN
OPHTHALMIC | Status: DC | PRN
  Administered 2019-05-17: 1 [drp] via OPHTHALMIC

## 2019-05-17 SURGICAL SUPPLY — 29 items
CANNULA ANT/CHMB 27G (MISCELLANEOUS) ×1 IMPLANT
CANNULA ANT/CHMB 27GA (MISCELLANEOUS) ×3 IMPLANT
GLOVE BIOGEL PI IND STRL 7.5 (GLOVE) IMPLANT
GLOVE BIOGEL PI INDICATOR 7.5 (GLOVE) ×6
GOWN STRL REUS W/ TWL LRG LVL3 (GOWN DISPOSABLE) ×2 IMPLANT
GOWN STRL REUS W/TWL LRG LVL3 (GOWN DISPOSABLE) ×4
LENS IOL TECNIS 21 (Intraocular Lens) ×1 IMPLANT
LENS IOL TECNIS 21.0 (Intraocular Lens) ×2 IMPLANT
LENS IOL TECNIS MONO 1P 21.0 (Intraocular Lens) IMPLANT
MARKER SKIN DUAL TIP RULER LAB (MISCELLANEOUS) ×3 IMPLANT
NDL CAPSULORHEX 25GA (NEEDLE) ×1 IMPLANT
NDL FILTER BLUNT 18X1 1/2 (NEEDLE) ×2 IMPLANT
NDL RETROBULBAR .5 NSTRL (NEEDLE) IMPLANT
NEEDLE CAPSULORHEX 25GA (NEEDLE) ×3 IMPLANT
NEEDLE FILTER BLUNT 18X 1/2SAF (NEEDLE) ×4
NEEDLE FILTER BLUNT 18X1 1/2 (NEEDLE) ×2 IMPLANT
PACK CATARACT BRASINGTON (MISCELLANEOUS) ×3 IMPLANT
PACK EYE AFTER SURG (MISCELLANEOUS) ×3 IMPLANT
PACK OPTHALMIC (MISCELLANEOUS) ×3 IMPLANT
RING MALYGIN 7.0 (MISCELLANEOUS) IMPLANT
SOLUTION OPHTHALMIC SALT (MISCELLANEOUS) ×3 IMPLANT
SUT ETHILON 10-0 CS-B-6CS-B-6 (SUTURE)
SUT VICRYL  9 0 (SUTURE)
SUT VICRYL 9 0 (SUTURE) IMPLANT
SUTURE EHLN 10-0 CS-B-6CS-B-6 (SUTURE) IMPLANT
SYR 3ML LL SCALE MARK (SYRINGE) ×6 IMPLANT
SYR TB 1ML LUER SLIP (SYRINGE) ×3 IMPLANT
WATER STERILE IRR 250ML POUR (IV SOLUTION) ×3 IMPLANT
WIPE NON LINTING 3.25X3.25 (MISCELLANEOUS) ×3 IMPLANT

## 2019-05-17 NOTE — Op Note (Signed)
OPERATIVE NOTE  Jt Haefs [id number] 05/17/2019  PREOPERATIVE DIAGNOSIS:   Nuclear sclerotic cataract left eye with miotic pupil      H25.12   POSTOPERATIVE DIAGNOSIS:   Nuclear sclerotic cataract left eye with miotic pupil.     PROCEDURE:  Phacoemulsification with posterior chamber intraocular lens implantation of the left eye which required pupil stretching with the Malyugin pupil expansion device  Ultrasound time: Procedure(s) with comments: CATARACT EXTRACTION PHACO AND INTRAOCULAR LENS PLACEMENT (IOC) LEFT DIABETIC MALYUGIN (Left) - 16.54 1:31.1 18.2%  LENS:   Implant Name Type Inv. Item Serial No. Manufacturer Lot No. LRB No. Used Action  LENS IOL TECNIS 21.0 - [id number] Intraocular Lens LENS IOL TECNIS 21.0 [id number]  Left 1 Implanted   SURGEON:  Wyonia Hough, MD   ANESTHESIA: Topical with tetracaine drops and 2% Xylocaine jelly, augmented with 1% preservative-free intracameral lidocaine.   COMPLICATIONS:  None.   DESCRIPTION OF PROCEDURE:  The patient was identified in the holding room and transported to the operating room and placed in the supine position under the operating microscope.  The left eye was identified as the operative eye and it was prepped and draped in the usual sterile ophthalmic fashion.   A 1 millimeter clear-corneal paracentesis was made at the 1:30 position.  The anterior chamber was filled with Viscoat viscoelastic.  0.5 ml of preservative-free 1% lidocaine was injected into the anterior chamber.  A 2.4 millimeter keratome was used to make a near-clear corneal incision at the 10:30 position.  A Malyugin pupil expander was then placed through the main incision and into the anterior chamber of the eye.  The edge of the iris was secured on the lip of the pupil expander and it was released, thereby expanding the pupil to approximately 7 millimeters for completion of the cataract surgery.  Additional Viscoat was placed in the anterior chamber.  A  cystotome and capsulorrhexis forceps were used to make a curvilinear capsulorrhexis.   Balanced salt solution was used to hydrodissect and hydrodelineate the lens nucleus.   Phacoemulsification was used in stop and chop fashion to remove the lens, nucleus and epinucleus.  The remaining cortex was aspirated using the irrigation aspiration handpiece.  Additional Provisc was placed into the eye to distend the capsular bag for lens placement.  A lens was then injected into the capsular bag.  The pupil expanding ring was removed using a Kuglen hook and insertion device. The remaining viscoelastic was aspirated from the capsular bag and the anterior chamber.  The anterior chamber was filled with balanced salt solution to inflate to a physiologic pressure.   Wounds were hydrated with balanced salt solution.  The anterior chamber was inflated to a physiologic pressure with balanced salt solution.  No wound leaks were noted. Cefuroxime 0.1 ml of a 10mg /ml solution was injected into the anterior chamber for a dose of 1 mg of intracameral antibiotic at the completion of the case.   Timolol and Brimonidine drops were applied to the eye.  The patient was taken to the recovery room in stable condition without complications of anesthesia or surgery.  Yenesis Even 05/17/2019, 10:42 AM

## 2019-05-17 NOTE — Anesthesia Procedure Notes (Signed)
Procedure Name: MAC Date/Time: 05/17/2019 10:20 AM Performed by: Vanetta Shawl, CRNA Pre-anesthesia Checklist: Patient identified, Emergency Drugs available, Suction available, Timeout performed and Patient being monitored Patient Re-evaluated:Patient Re-evaluated prior to induction Oxygen Delivery Method: Nasal cannula Placement Confirmation: positive ETCO2

## 2019-05-17 NOTE — H&P (Signed)

## 2019-05-17 NOTE — Transfer of Care (Signed)
Immediate Anesthesia Transfer of Care Note  Patient: Jared Ball  Procedure(s) Performed: CATARACT EXTRACTION PHACO AND INTRAOCULAR LENS PLACEMENT (IOC) LEFT DIABETIC MALYUGIN (Left Eye)  Patient Location: PACU  Anesthesia Type: MAC  Level of Consciousness: awake, alert  and patient cooperative  Airway and Oxygen Therapy: Patient Spontanous Breathing   Post-op Assessment: Post-op Vital signs reviewed, Patient's Cardiovascular Status Stable, Respiratory Function Stable, Patent Airway and No signs of Nausea or vomiting  Post-op Vital Signs: Reviewed and stable  Complications: No apparent anesthesia complications

## 2019-05-17 NOTE — Anesthesia Postprocedure Evaluation (Signed)
Anesthesia Post Note  Patient: [name]  Procedure(s) Performed: CATARACT EXTRACTION PHACO AND INTRAOCULAR LENS PLACEMENT (IOC) LEFT DIABETIC MALYUGIN (Left Eye)     Patient location during evaluation: PACU Anesthesia Type: MAC Level of consciousness: awake and alert and oriented Pain management: satisfactory to patient Vital Signs Assessment: post-procedure vital signs reviewed and stable Respiratory status: spontaneous breathing, nonlabored ventilation and respiratory function stable Cardiovascular status: blood pressure returned to baseline and stable Postop Assessment: Adequate PO intake and No signs of nausea or vomiting Anesthetic complications: no    Raliegh Ip

## 2019-05-17 NOTE — Anesthesia Preprocedure Evaluation (Signed)
Anesthesia Evaluation  Patient identified by MRN, date of birth, ID band Patient awake    Reviewed: Allergy & Precautions, H&P , NPO status , Patient's Chart, lab work & pertinent test results  Airway Mallampati: II  TM Distance: >3 FB Neck ROM: full    Dental no notable dental hx.    Pulmonary sleep apnea , former smoker,    Pulmonary exam normal breath sounds clear to auscultation       Cardiovascular hypertension, + dysrhythmias Atrial Fibrillation  Rhythm:regular Rate:Normal     Neuro/Psych    GI/Hepatic GERD  ,  Endo/Other  diabetes  Renal/GU      Musculoskeletal   Abdominal   Peds  Hematology   Anesthesia Other Findings   Reproductive/Obstetrics                             Anesthesia Physical Anesthesia Plan  ASA: III  Anesthesia Plan: MAC   Post-op Pain Management:    Induction:   PONV Risk Score and Plan: 1 and Treatment may vary due to age or medical condition, TIVA and Midazolam  Airway Management Planned:   Additional Equipment:   Intra-op Plan:   Post-operative Plan:   Informed Consent: I have reviewed the patients History and Physical, chart, labs and discussed the procedure including the risks, benefits and alternatives for the proposed anesthesia with the patient or authorized representative who has indicated his/her understanding and acceptance.     Dental Advisory Given  Plan Discussed with: CRNA  Anesthesia Plan Comments:         Anesthesia Quick Evaluation

## 2019-05-18 ENCOUNTER — Encounter: Payer: Self-pay | Admitting: *Deleted

## 2019-05-29 ENCOUNTER — Other Ambulatory Visit: Payer: Self-pay | Admitting: Internal Medicine

## 2019-05-29 DIAGNOSIS — E118 Type 2 diabetes mellitus with unspecified complications: Secondary | ICD-10-CM

## 2019-05-29 DIAGNOSIS — R1319 Other dysphagia: Secondary | ICD-10-CM

## 2019-06-19 ENCOUNTER — Ambulatory Visit: Payer: Medicare HMO

## 2019-07-05 ENCOUNTER — Encounter: Payer: Self-pay | Admitting: Ophthalmology

## 2019-07-05 ENCOUNTER — Other Ambulatory Visit: Payer: Self-pay

## 2019-07-10 ENCOUNTER — Other Ambulatory Visit: Payer: Medicare HMO

## 2019-07-10 NOTE — Discharge Instructions (Signed)

## 2019-07-12 ENCOUNTER — Encounter: Payer: Self-pay | Admitting: Ophthalmology

## 2019-07-12 ENCOUNTER — Ambulatory Visit
Admission: RE | Admit: 2019-07-12 | Discharge: 2019-07-12 | Disposition: A | Discharge status: Home / Self Care | Payer: Medicare HMO | Attending: Ophthalmology | Admitting: Ophthalmology

## 2019-07-12 ENCOUNTER — Ambulatory Visit: Payer: Medicare HMO | Admitting: Anesthesiology

## 2019-07-12 ENCOUNTER — Other Ambulatory Visit: Payer: Self-pay

## 2019-07-12 ENCOUNTER — Encounter
Admission: RE | Disposition: A | Discharge status: Home / Self Care | Payer: Self-pay | Source: Home / Self Care | Attending: Ophthalmology

## 2019-07-12 DIAGNOSIS — G4730 Sleep apnea, unspecified: Secondary | ICD-10-CM | POA: Insufficient documentation

## 2019-07-12 DIAGNOSIS — M1990 Unspecified osteoarthritis, unspecified site: Secondary | ICD-10-CM | POA: Insufficient documentation

## 2019-07-12 DIAGNOSIS — H5703 Miosis: Secondary | ICD-10-CM | POA: Diagnosis not present

## 2019-07-12 DIAGNOSIS — K589 Irritable bowel syndrome without diarrhea: Secondary | ICD-10-CM | POA: Diagnosis not present

## 2019-07-12 DIAGNOSIS — Z87442 Personal history of urinary calculi: Secondary | ICD-10-CM | POA: Diagnosis not present

## 2019-07-12 DIAGNOSIS — E559 Vitamin D deficiency, unspecified: Secondary | ICD-10-CM | POA: Diagnosis not present

## 2019-07-12 DIAGNOSIS — I480 Paroxysmal atrial fibrillation: Secondary | ICD-10-CM | POA: Diagnosis not present

## 2019-07-12 DIAGNOSIS — E1136 Type 2 diabetes mellitus with diabetic cataract: Secondary | ICD-10-CM | POA: Diagnosis not present

## 2019-07-12 DIAGNOSIS — R0601 Orthopnea: Secondary | ICD-10-CM | POA: Diagnosis not present

## 2019-07-12 DIAGNOSIS — Z91040 Latex allergy status: Secondary | ICD-10-CM | POA: Insufficient documentation

## 2019-07-12 DIAGNOSIS — Z882 Allergy status to sulfonamides status: Secondary | ICD-10-CM | POA: Insufficient documentation

## 2019-07-12 DIAGNOSIS — I209 Angina pectoris, unspecified: Secondary | ICD-10-CM | POA: Insufficient documentation

## 2019-07-12 DIAGNOSIS — Z9842 Cataract extraction status, left eye: Secondary | ICD-10-CM | POA: Insufficient documentation

## 2019-07-12 DIAGNOSIS — Z87891 Personal history of nicotine dependence: Secondary | ICD-10-CM | POA: Insufficient documentation

## 2019-07-12 DIAGNOSIS — Z91048 Other nonmedicinal substance allergy status: Secondary | ICD-10-CM | POA: Diagnosis not present

## 2019-07-12 DIAGNOSIS — K219 Gastro-esophageal reflux disease without esophagitis: Secondary | ICD-10-CM | POA: Diagnosis not present

## 2019-07-12 DIAGNOSIS — H2511 Age-related nuclear cataract, right eye: Secondary | ICD-10-CM | POA: Insufficient documentation

## 2019-07-12 DIAGNOSIS — N400 Enlarged prostate without lower urinary tract symptoms: Secondary | ICD-10-CM | POA: Insufficient documentation

## 2019-07-12 DIAGNOSIS — I10 Essential (primary) hypertension: Secondary | ICD-10-CM | POA: Diagnosis not present

## 2019-07-12 DIAGNOSIS — F419 Anxiety disorder, unspecified: Secondary | ICD-10-CM | POA: Insufficient documentation

## 2019-07-12 DIAGNOSIS — E7800 Pure hypercholesterolemia, unspecified: Secondary | ICD-10-CM | POA: Diagnosis not present

## 2019-07-12 DIAGNOSIS — R42 Dizziness and giddiness: Secondary | ICD-10-CM | POA: Insufficient documentation

## 2019-07-12 HISTORY — PX: CATARACT EXTRACTION W/PHACO: SHX586

## 2019-07-12 LAB — GLUCOSE, CAPILLARY
Glucose-Capillary: 142 mg/dL — ABNORMAL HIGH (ref 70–99)
Glucose-Capillary: 131 mg/dL — ABNORMAL HIGH (ref 70–99)

## 2019-07-12 SURGERY — PHACOEMULSIFICATION, CATARACT, WITH IOL INSERTION
Anesthesia: Monitor Anesthesia Care | Site: Eye | Laterality: Right

## 2019-07-12 MED ORDER — EPINEPHRINE PF 1 MG/ML IJ SOLN
INTRAOCULAR | Status: DC | PRN
  Administered 2019-07-12: 77 mL via OPHTHALMIC

## 2019-07-12 MED ORDER — FENTANYL CITRATE (PF) 100 MCG/2ML IJ SOLN
INTRAMUSCULAR | Status: DC | PRN
  Administered 2019-07-12: 50 ug via INTRAVENOUS

## 2019-07-12 MED ORDER — LACTATED RINGERS IV SOLN: INTRAVENOUS | Status: DC

## 2019-07-12 MED ORDER — ARMC OPHTHALMIC DILATING DROPS
1.0000 "application " | OPHTHALMIC | Status: DC | PRN
  Administered 2019-07-12 (×3): 1 via OPHTHALMIC

## 2019-07-12 MED ORDER — MIDAZOLAM HCL 2 MG/2ML IJ SOLN
INTRAMUSCULAR | Status: DC | PRN
  Administered 2019-07-12: 1 mg via INTRAVENOUS

## 2019-07-12 MED ORDER — TETRACAINE HCL 0.5 % OP SOLN
1.0000 [drp] | OPHTHALMIC | Status: DC | PRN
  Administered 2019-07-12 (×3): 1 [drp] via OPHTHALMIC

## 2019-07-12 MED ORDER — MOXIFLOXACIN HCL 0.5 % OP SOLN
1.0000 [drp] | OPHTHALMIC | Status: DC | PRN
  Administered 2019-07-12 (×3): 1 [drp] via OPHTHALMIC

## 2019-07-12 MED ORDER — BRIMONIDINE TARTRATE-TIMOLOL 0.2-0.5 % OP SOLN
OPHTHALMIC | Status: DC | PRN
  Administered 2019-07-12: 1 [drp] via OPHTHALMIC

## 2019-07-12 MED ORDER — CEFUROXIME OPHTHALMIC INJECTION 1 MG/0.1 ML
INJECTION | OPHTHALMIC | Status: DC | PRN
  Administered 2019-07-12: 0.1 mL via INTRACAMERAL

## 2019-07-12 MED ORDER — LIDOCAINE HCL (PF) 2 % IJ SOLN
INTRAOCULAR | Status: DC | PRN
  Administered 2019-07-12: 2 mL

## 2019-07-12 MED ORDER — NA HYALUR & NA CHOND-NA HYALUR 0.4-0.35 ML IO KIT
PACK | INTRAOCULAR | Status: DC | PRN
  Administered 2019-07-12: 1 mL via INTRAOCULAR

## 2019-07-12 SURGICAL SUPPLY — 21 items
CANNULA ANT/CHMB 27GA (MISCELLANEOUS) ×3 IMPLANT
GLOVE SURG LX 7.5 STRW (GLOVE) ×2
GLOVE SURG LX STRL 7.5 STRW (GLOVE) ×1 IMPLANT
GLOVE SURG TRIUMPH 8.0 PF LTX (GLOVE) ×3 IMPLANT
GOWN STRL REUS W/ TWL LRG LVL3 (GOWN DISPOSABLE) ×2 IMPLANT
GOWN STRL REUS W/TWL LRG LVL3 (GOWN DISPOSABLE) ×6
LENS IOL DIOP 21.5 (Intraocular Lens) ×3 IMPLANT
LENS IOL TECNIS MONO 21.5 (Intraocular Lens) ×1 IMPLANT
MARKER SKIN DUAL TIP RULER LAB (MISCELLANEOUS) ×3 IMPLANT
NEEDLE CAPSULORHEX 25GA (NEEDLE) ×3 IMPLANT
NEEDLE FILTER BLUNT 18X 1/2SAF (NEEDLE) ×4
NEEDLE FILTER BLUNT 18X1 1/2 (NEEDLE) ×2 IMPLANT
PACK CATARACT BRASINGTON (MISCELLANEOUS) ×3 IMPLANT
PACK EYE AFTER SURG (MISCELLANEOUS) ×3 IMPLANT
PACK OPTHALMIC (MISCELLANEOUS) ×3 IMPLANT
RING MALYGIN 7.0 (MISCELLANEOUS) ×3 IMPLANT
SOLUTION OPHTHALMIC SALT (MISCELLANEOUS) ×3 IMPLANT
SYR 3ML LL SCALE MARK (SYRINGE) ×6 IMPLANT
SYR TB 1ML LUER SLIP (SYRINGE) ×3 IMPLANT
WATER STERILE IRR 250ML POUR (IV SOLUTION) ×3 IMPLANT
WIPE NON LINTING 3.25X3.25 (MISCELLANEOUS) ×3 IMPLANT

## 2019-07-12 NOTE — H&P (Signed)

## 2019-07-12 NOTE — Op Note (Signed)
OPERATIVE NOTE  Jared Ball [id number] 07/12/2019   PREOPERATIVE DIAGNOSIS:    Nuclear Sclerotic Cataract Right eye with miotic pupil.        H25.11  POSTOPERATIVE DIAGNOSIS: Nuclear Sclerotic Cataract Right eye with miotic pupil.          PROCEDURE:  Phacoemusification with posterior chamber intraocular lens placement of the right eye which required pupil stretching with the Malyugin pupil expansion device. Ultrasound time: Procedure(s) with comments: CATARACT EXTRACTION PHACO AND INTRAOCULAR LENS PLACEMENT (IOC) RIGHT MALYUGIN DIABETIC 14.90 01:37.8 15.3% (Right) - Diabetic - oral meds Latex  LENS:   Implant Name Type Inv. Item Serial No. Manufacturer Lot No. LRB No. Used Action  LENS IOL DIOP 21.5 - [id number] Intraocular Lens LENS IOL DIOP 21.5 [id number] [employer]O [employer]  Right 1 Implanted       SURGEON:  Wyonia Hough, MD   ANESTHESIA:  Topical with tetracaine drops and 2% Xylocaine jelly, augmented with 1% preservative-free intracameral lidocaine.   COMPLICATIONS:  None.   DESCRIPTION OF PROCEDURE:  The patient was identified in the holding room and transported to the operating room and placed in the supine position under the operating microscope. Theright eye was identified as the operative eye and it was prepped and draped in the usual sterile ophthalmic fashion.   A 1 millimeter clear-corneal paracentesis was made at the 12:00 position.  0.5 ml of preservative-free 1% lidocaine was injected into the anterior chamber. The anterior chamber was filled with Viscoat viscoelastic.  A 2.4 millimeter keratome was used to make a near-clear corneal incision at the 9:00 position. A Malyugin pupil expander was then placed through the main incision and into the anterior chamber of the eye.  The edge of the iris was secured on the lip of the pupil expander and it was released, thereby expanding the pupil to approximately 7 millimeters for completion of the cataract  surgery.  Additional Viscoat was placed in the anterior chamber.  A cystotome and capsulorrhexis forceps were used to make a curvilinear capsulorrhexis.   Balanced salt solution was used to hydrodissect and hydrodelineate the lens nucleus.   Phacoemulsification was used in stop and chop fashion to remove the lens, nucleus and epinucleus.  The remaining cortex was aspirated using the irrigation aspiration handpiece.  Additional Provisc was placed into the eye to distend the capsular bag for lens placement.  A lens was then injected into the capsular bag.  The pupil expanding ring was removed using a Kuglen hook and insertion device. The remaining viscoelastic was aspirated from the capsular bag and the anterior chamber.  The anterior chamber was filled with balanced salt solution to inflate to a physiologic pressure.  Wounds were hydrated with balanced salt solution.  The anterior chamber was inflated to a physiologic pressure with balanced salt solution.  No wound leaks were noted.Cefuroxime 0.1 ml of a 10mg /ml solution was injected into the anterior chamber for a dose of 1 mg of intracameral antibiotic at the completion of the case. Timolol and Brimonidine drops were applied to the eye.  The patient was taken to the recovery room in stable condition without complications of anesthesia or surgery.  [name] 07/12/2019, 11:15 AM

## 2019-07-12 NOTE — Anesthesia Postprocedure Evaluation (Signed)
Anesthesia Post Note  Patient: Jared Ball  Procedure(s) Performed: CATARACT EXTRACTION PHACO AND INTRAOCULAR LENS PLACEMENT (IOC) RIGHT MALYUGIN DIABETIC 14.90 01:37.8 15.3% (Right Eye)     Patient location during evaluation: PACU Anesthesia Type: MAC Level of consciousness: awake and alert Pain management: pain level controlled Vital Signs Assessment: post-procedure vital signs reviewed and stable Respiratory status: spontaneous breathing Cardiovascular status: blood pressure returned to baseline Postop Assessment: no apparent nausea or vomiting, adequate PO intake and no headache Anesthetic complications: no   No complications documented.  Adele Barthel Aleni Andrus

## 2019-07-12 NOTE — Anesthesia Procedure Notes (Signed)
Procedure Name: MAC Date/Time: 07/12/2019 10:57 AM Performed by: Silvana Newness, CRNA Pre-anesthesia Checklist: Patient identified, Emergency Drugs available, Suction available, Patient being monitored and Timeout performed Patient Re-evaluated:Patient Re-evaluated prior to induction Oxygen Delivery Method: Nasal cannula

## 2019-07-12 NOTE — Transfer of Care (Signed)
Immediate Anesthesia Transfer of Care Note  Patient: Jared Ball  Procedure(s) Performed: CATARACT EXTRACTION PHACO AND INTRAOCULAR LENS PLACEMENT (IOC) RIGHT MALYUGIN DIABETIC 14.90 01:37.8 15.3% (Right Eye)  Patient Location: PACU  Anesthesia Type: MAC  Level of Consciousness: awake, alert  and patient cooperative  Airway and Oxygen Therapy: Patient Spontanous Breathing and Patient connected to supplemental oxygen  Post-op Assessment: Post-op Vital signs reviewed, Patient's Cardiovascular Status Stable, Respiratory Function Stable, Patent Airway and No signs of Nausea or vomiting  Post-op Vital Signs: Reviewed and stable  Complications: No complications documented.

## 2019-07-12 NOTE — Anesthesia Preprocedure Evaluation (Addendum)
Anesthesia Evaluation  Patient identified by MRN, date of birth, ID band Patient awake    History of Anesthesia Complications Negative for: history of anesthetic complications  Airway Mallampati: III  TM Distance: >3 FB Neck ROM: Full    Dental no notable dental hx.    Pulmonary sleep apnea , former smoker,    Pulmonary exam normal        Cardiovascular hypertension, + angina with exertion Normal cardiovascular exam+ dysrhythmias (paroxysmal afib) Atrial Fibrillation      Neuro/Psych    GI/Hepatic GERD  ,  Endo/Other  diabetes, Type 2  Renal/GU      Musculoskeletal   Abdominal   Peds  Hematology   Anesthesia Other Findings   Reproductive/Obstetrics                            Anesthesia Physical Anesthesia Plan  ASA: III  Anesthesia Plan: MAC   Post-op Pain Management:    Induction: Intravenous  PONV Risk Score and Plan: 1 and Midazolam, TIVA and Treatment may vary due to age or medical condition  Airway Management Planned: Natural Airway and Nasal Cannula  Additional Equipment: None  Intra-op Plan:   Post-operative Plan:   Informed Consent: I have reviewed the patients History and Physical, chart, labs and discussed the procedure including the risks, benefits and alternatives for the proposed anesthesia with the patient or authorized representative who has indicated his/her understanding and acceptance.       Plan Discussed with: CRNA  Anesthesia Plan Comments: (Patient was seen by cardiologist 2 days ago with symptoms of stable angina. Per cards note, it was recommended that he have a nuclear study to evaluate him for coronary ischemia. Note says this can be done after surgery. I clarified with the patient that his angina was only with exertion and resolved with rest. He said this is correct, and his symptoms have been unchanged for over 6 months. I explained to him my  concerns about having surgery and his body having a stress response, putting him at increased risk for a cardiac event. The facts that he had this procedure last month and did well and that his symptoms are unchanged for months and are not consistent with unstable angina are reassuring. I told the patient that as long as he fully understands the risks, that I felt that it is appropriate to proceed with the procedure today, and he agreed. )        Anesthesia Quick Evaluation

## 2019-08-10 ENCOUNTER — Encounter: Payer: Self-pay | Admitting: Podiatry

## 2019-08-10 ENCOUNTER — Ambulatory Visit (INDEPENDENT_AMBULATORY_CARE_PROVIDER_SITE_OTHER): Payer: Medicare HMO | Admitting: Podiatry

## 2019-08-10 ENCOUNTER — Other Ambulatory Visit: Payer: Self-pay

## 2019-08-10 DIAGNOSIS — M79675 Pain in left toe(s): Secondary | ICD-10-CM | POA: Diagnosis not present

## 2019-08-10 DIAGNOSIS — B351 Tinea unguium: Secondary | ICD-10-CM | POA: Insufficient documentation

## 2019-08-10 DIAGNOSIS — E1165 Type 2 diabetes mellitus with hyperglycemia: Secondary | ICD-10-CM

## 2019-08-10 DIAGNOSIS — M79674 Pain in right toe(s): Secondary | ICD-10-CM

## 2019-08-10 NOTE — Progress Notes (Signed)
This patient returns to my office for at risk foot care.  This patient requires this care by a professional since this patient will be at risk due to having diabetes.  Patient presents to the office with his daughter.  This patient is unable to cut nails himself since the patient cannot reach his nails.These nails are painful walking and wearing shoes.  This patient presents for at risk foot care today.  General Appearance  Alert, conversant and in no acute stress.  Vascular  Dorsalis pedis and posterior tibial  pulses are palpable  Right foot.  Dorsalis pedis and posterior tibial pulses are absent  B/L.  .  Capillary return is within normal limits  bilaterally. Temperature is within normal limits  bilaterally.  Neurologic  Senn-Weinstein monofilament wire test within normal limits  bilaterally. Muscle power within normal limits bilaterally.  Nails Thick disfigured discolored nails with subungual debris  from hallux to fifth toes bilaterally. No evidence of bacterial infection or drainage bilaterally. Disfigured discolored  Hallux nail right hallux  Following previous nail surgery.  Orthopedic  No limitations of motion  feet .  No crepitus or effusions noted.  No bony pathology or digital deformities noted.  Skin  normotropic skin with no porokeratosis noted bilaterally.  No signs of infections or ulcers noted.  Skin necrosis distal tip second toe left foot.   Onychomycosis  Pain in right toes  Pain in left toes  Consent was obtained for treatment procedures.   Mechanical debridement of nails 1-5  bilaterally performed with a nail nipper.  Filed with dremel without incident.    Return office visit     3 months.                Told patient to return for periodic foot care and evaluation due to potential at risk complications.   Emmette Katt DPM  

## 2019-08-14 ENCOUNTER — Other Ambulatory Visit: Payer: Self-pay

## 2019-08-14 ENCOUNTER — Ambulatory Visit
Admission: RE | Admit: 2019-08-14 | Discharge: 2019-08-14 | Disposition: A | Discharge status: Home / Self Care | Payer: Medicare HMO | Source: Ambulatory Visit | Attending: Internal Medicine | Admitting: Internal Medicine

## 2019-08-14 DIAGNOSIS — E118 Type 2 diabetes mellitus with unspecified complications: Secondary | ICD-10-CM

## 2019-08-14 DIAGNOSIS — R1319 Other dysphagia: Secondary | ICD-10-CM

## 2019-08-14 DIAGNOSIS — R1310 Dysphagia, unspecified: Secondary | ICD-10-CM | POA: Diagnosis present

## 2019-10-02 ENCOUNTER — Other Ambulatory Visit
Admission: RE | Admit: 2019-10-02 | Discharge: 2019-10-02 | Disposition: A | Discharge status: Home / Self Care | Payer: Medicare HMO | Source: Ambulatory Visit | Attending: Internal Medicine | Admitting: Internal Medicine

## 2019-10-02 DIAGNOSIS — Z79899 Other long term (current) drug therapy: Secondary | ICD-10-CM | POA: Diagnosis present

## 2019-10-02 DIAGNOSIS — I493 Ventricular premature depolarization: Secondary | ICD-10-CM | POA: Diagnosis present

## 2019-10-02 DIAGNOSIS — I480 Paroxysmal atrial fibrillation: Secondary | ICD-10-CM | POA: Insufficient documentation

## 2019-10-02 LAB — BRAIN NATRIURETIC PEPTIDE: B Natriuretic Peptide: 310 pg/mL — ABNORMAL HIGH (ref 0.0–100.0)

## 2019-10-23 ENCOUNTER — Encounter (INDEPENDENT_AMBULATORY_CARE_PROVIDER_SITE_OTHER): Payer: Self-pay | Admitting: Vascular Surgery

## 2019-10-23 ENCOUNTER — Other Ambulatory Visit
Admission: RE | Admit: 2019-10-23 | Discharge: 2019-10-23 | Disposition: A | Discharge status: Home / Self Care | Payer: Medicare HMO | Source: Ambulatory Visit | Attending: Internal Medicine | Admitting: Internal Medicine

## 2019-10-23 ENCOUNTER — Other Ambulatory Visit: Payer: Self-pay

## 2019-10-23 ENCOUNTER — Ambulatory Visit (INDEPENDENT_AMBULATORY_CARE_PROVIDER_SITE_OTHER): Payer: Medicare HMO | Admitting: Vascular Surgery

## 2019-10-23 VITALS — BP 127/61 | HR 69 | Resp 16 | Ht 70.5 in | Wt 218.8 lb

## 2019-10-23 DIAGNOSIS — Z01812 Encounter for preprocedural laboratory examination: Secondary | ICD-10-CM | POA: Insufficient documentation

## 2019-10-23 DIAGNOSIS — I6523 Occlusion and stenosis of bilateral carotid arteries: Secondary | ICD-10-CM | POA: Diagnosis not present

## 2019-10-23 DIAGNOSIS — E119 Type 2 diabetes mellitus without complications: Secondary | ICD-10-CM | POA: Diagnosis not present

## 2019-10-23 DIAGNOSIS — I6529 Occlusion and stenosis of unspecified carotid artery: Secondary | ICD-10-CM | POA: Insufficient documentation

## 2019-10-23 DIAGNOSIS — E7800 Pure hypercholesterolemia, unspecified: Secondary | ICD-10-CM

## 2019-10-23 DIAGNOSIS — I872 Venous insufficiency (chronic) (peripheral): Secondary | ICD-10-CM | POA: Diagnosis not present

## 2019-10-23 DIAGNOSIS — Z20822 Contact with and (suspected) exposure to covid-19: Secondary | ICD-10-CM | POA: Diagnosis not present

## 2019-10-23 DIAGNOSIS — I10 Essential (primary) hypertension: Secondary | ICD-10-CM

## 2019-10-23 LAB — SARS CORONAVIRUS 2 (TAT 6-24 HRS): SARS Coronavirus 2: NEGATIVE

## 2019-10-23 NOTE — Progress Notes (Signed)
MRN : [id number]  Jared Ball is a 86 y.o. ([birth year]) male who presents with chief complaint of  Chief Complaint  Patient presents with  . New Patient (Initial Visit)    ref White carotid 50-69%  .  History of Present Illness:   The patient is seen for evaluation of carotid stenosis.   The patient denies amaurosis fugax. There is no recent history of TIA symptoms or focal motor deficits. There is no prior documented CVA.  There is no history of migraine headaches. There is no history of seizures.  The patient is taking enteric-coated aspirin 81 mg daily.  He is also c/o leg pain and swelling particularly with standing  The patient has a history of coronary artery disease, no recent episodes of angina or shortness of breath. The patient denies PAD or claudication symptoms. There is a history of hyperlipidemia which is being treated with a statin.    Current Meds  Medication Sig  . acetaminophen (TYLENOL) 500 MG tablet Take 500 mg by mouth every 8 (eight) hours as needed for mild pain or moderate pain.   Marland Kitchen aspirin EC 81 MG tablet Take 81 mg by mouth daily.   . calcium elemental as carbonate (TUMS ULTRA 1000) 400 MG chewable tablet Chew 1,000 mg by mouth daily as needed for heartburn.  . carboxymethylcellulose (REFRESH PLUS) 0.5 % SOLN Place 1 drop into both eyes 3 (three) times daily as needed (dry eye).  . Cholecalciferol (VITAMIN D-1000 MAX ST) 1000 units tablet Take 1,000 Units by mouth daily.   . Cyanocobalamin (VITAMIN B-12) 3000 MCG SUBL Place 3,000 mcg under the tongue daily.  Marland Kitchen dextromethorphan (DELSYM) 30 MG/5ML liquid Take 60 mg by mouth daily as needed for cough.  . finasteride (PROSCAR) 5 MG tablet TAKE 1 TABLET BY MOUTH EVERY DAY (Patient taking differently: Take 5 mg by mouth daily. )  . flecainide (TAMBOCOR) 100 MG tablet Take 100 mg by mouth 2 (two) times daily.  . fluticasone (FLONASE) 50 MCG/ACT nasal spray Place 1 spray into the nose daily as needed for  allergies.   Marland Kitchen glucose blood (BAYER CONTOUR TEST) test strip   . isosorbide mononitrate (IMDUR) 60 MG 24 hr tablet Take 60 mg by mouth daily.   Marland Kitchen loperamide (IMODIUM A-D) 2 MG tablet Take 2 mg by mouth 3 (three) times daily as needed for diarrhea or loose stools.   . lovastatin (MEVACOR) 40 MG tablet Take 40 mg by mouth at bedtime.   . metFORMIN (GLUCOPHAGE) 500 MG tablet Take 500 mg by mouth 3 (three) times daily.   . metoprolol tartrate (LOPRESSOR) 25 MG tablet Take 25 mg by mouth daily as needed (High blood pressure).   . polyethylene glycol (MIRALAX / GLYCOLAX) 17 g packet Take 17 g by mouth daily as needed for severe constipation.  . tamsulosin (FLOMAX) 0.4 MG CAPS capsule TAKE 1 CAPSULE BY MOUTH EVERY DAY (Patient taking differently: Take 0.4 mg by mouth at bedtime. )  . torsemide (DEMADEX) 5 MG tablet Take 5 mg by mouth daily.     Past Medical History:  Diagnosis Date  . Anxiety   . Arthritis   . Atrial fibrillation ([place])   . BPH (benign prostatic hyperplasia)   . Bronchitis   . Chronic kidney disease   . Cognitive changes   . Diabetes mellitus without complication (HCC)    Type 2  . Dysrhythmia   . GERD (gastroesophageal reflux disease)   . Headache   . History  of kidney stones    hx of  . HOH (hard of hearing)   . Hypercholesteremia   . Hypertension   . IBS (irritable bowel syndrome)   . PONV (postoperative nausea and vomiting)   . Shingles 15 yrs ago   HX of  . Shortness of breath dyspnea    heart related  . Sleep apnea    no CPAP  . Spinal stenosis   . Vertigo   . Vitamin D deficiency     Past Surgical History:  Procedure Laterality Date  . CATARACT EXTRACTION W/PHACO Left 05/17/2019   Procedure: CATARACT EXTRACTION PHACO AND INTRAOCULAR LENS PLACEMENT ([place]) LEFT DIABETIC MALYUGIN;  Surgeon: Leandrew Koyanagi, MD;  Location: [facility];  Service: Ophthalmology;  Laterality: Left;  16.54 1:31.1 18.2%  . CATARACT EXTRACTION W/PHACO Right 07/12/2019     Procedure: CATARACT EXTRACTION PHACO AND INTRAOCULAR LENS PLACEMENT ([facility]) RIGHT MALYUGIN DIABETIC 14.90 01:37.8 15.3%;  Surgeon: Leandrew Koyanagi, MD;  Location: [facility];  Service: Ophthalmology;  Laterality: Right;  Diabetic - oral meds Latex  . COLONOSCOPY    . CYSTOSCOPY W/ RETROGRADES Bilateral 07/02/2014   Procedure: CYSTOSCOPY WITH RETROGRADE PYELOGRAM;  Surgeon: Hollice Espy, MD;  Location: [facility];  Service: Urology;  Laterality: Bilateral;  . CYSTOSCOPY WITH STENT PLACEMENT Left 07/02/2014   Procedure: CYSTOSCOPY WITH STENT PLACEMENT;  Surgeon: Hollice Espy, MD;  Location: [facility];  Service: Urology;  Laterality: Left;  . DENTAL SURGERY     in the [employer]  . TONSILLECTOMY     and adenoids  . URETEROSCOPY WITH HOLMIUM LASER LITHOTRIPSY Left 07/02/2014   Procedure: URETEROSCOPY WITH HOLMIUM LASER LITHOTRIPSY;  Surgeon: Hollice Espy, MD;  Location: [facility];  Service: Urology;  Laterality: Left;    Social History Social History   Tobacco Use  . Smoking status: Former Smoker    Quit date: 1990    Years since quitting: 31.7  . Smokeless tobacco: Never Used  Substance Use Topics  . Alcohol use: Yes    Alcohol/week: 0.0 standard drinks    Comment: red wine occasionally  . Drug use: No    Family History Family History  Problem Relation Age of Onset  . Kidney disease Mother   . Prostate cancer Neg Hx   . Kidney cancer Neg Hx   . Bladder Cancer Neg Hx   No family history of bleeding/clotting disorders, porphyria or autoimmune disease   Allergies  Allergen Reactions  . Latex Itching and Rash    ekg lead adhesive ? If left on to long Latex exam gloves  . Sulfa Antibiotics Nausea And Vomiting and Other (See Comments)  . Tape Rash    adhesive  . Metformin Diarrhea    High dose gives diarrhea High dose gives diarrhea  . Sulfasalazine Nausea Only and Nausea And Vomiting  . Zoster Vaccine Live Hives and Rash    Localized; Vaccine for  shingles localized Localized; Vaccine for shingles     REVIEW OF SYSTEMS (Negative unless checked)  Constitutional: [] Weight loss  [] Fever  [] Chills Cardiac: [] Chest pain   [] Chest pressure   [] Palpitations   [] Shortness of breath when laying flat   [] Shortness of breath with exertion. Vascular:  [] Pain in legs with walking   [x] Pain in legs at rest  [] History of DVT   [] Phlebitis   [x] Swelling in legs   [x] Varicose veins   [] Non-healing ulcers Pulmonary:   [] Uses home oxygen   [] Productive cough   [] Hemoptysis   [] Wheeze  [] COPD   []   Asthma Neurologic:  [x] Dizziness   [] Seizures   [] History of stroke   [] History of TIA  [] Aphasia   [] Vissual changes   [] Weakness or numbness in arm   [] Weakness or numbness in leg Musculoskeletal:   [] Joint swelling   [x] Joint pain   [x] Low back pain Hematologic:  [] Easy bruising  [] Easy bleeding   [] Hypercoagulable state   [] Anemic Gastrointestinal:  [] Diarrhea   [] Vomiting  [] Gastroesophageal reflux/heartburn   [] Difficulty swallowing. Genitourinary:  [x] Chronic kidney disease   [] Difficult urination  [] Frequent urination   [] Blood in urine Skin:  [x] Rashes   [] Ulcers  Psychological:  [] History of anxiety   []  History of major depression.  Physical Examination  Vitals:   10/23/19 1331  BP: 127/61  Pulse: 69  Resp: 16  Weight: 218 lb 12.8 oz (99.2 kg)  Height: 5' 10.5" (1.791 m)   Body mass index is 30.95 kg/m. Gen: WD/WN, NAD Head: [place]/AT, No temporalis wasting.  Ear/Nose/Throat: Hearing grossly intact, nares w/o erythema or drainage, poor dentition Eyes: PER, EOMI, sclera nonicteric.  Neck: Supple, no masses.  No bruit or JVD.  Pulmonary:  Good air movement, clear to auscultation bilaterally, no use of accessory muscles.  Cardiac: RRR, normal S1, S2, no Murmurs. Vascular: bilateral carotid bruits;  scattered varicosities present bilaterally.  Moderate venous stasis changes to the legs bilaterally.  3+ soft pitting edema. Vessel Right Left   Radial Palpable Palpable  Carotid Palpable Palpable  PT Palpable Palpable  DP Palpable Palpable  Gastrointestinal: soft, non-distended. No guarding/no peritoneal signs.  Musculoskeletal: M/S 5/5 throughout.  No deformity or atrophy.  Neurologic: CN 2-12 intact. Pain and light touch intact in extremities.  Symmetrical.  Speech is fluent. Motor exam as listed above. Psychiatric: Judgment intact, Mood & affect appropriate for pt's clinical situation. Dermatologic: Moderate rashes no ulcers noted.  No changes consistent with cellulitis. Lymph : No severe lichenification or skin changes of chronic lymphedema.  CBC Lab Results  Component Value Date   WBC 7.6 07/19/2016   HGB 15.8 07/19/2016   HCT 48.2 07/19/2016   MCV 85.6 07/19/2016   PLT 199 07/19/2016    BMET    Component Value Date/Time   NA 137 07/19/2016 1142   NA 138 01/01/2014 1821   K 4.4 07/19/2016 1142   K 4.3 01/01/2014 1821   CL 101 07/19/2016 1142   CL 103 01/01/2014 1821   CO2 29 07/19/2016 1142   CO2 28 01/01/2014 1821   GLUCOSE 240 (H) 07/19/2016 1142   GLUCOSE 109 (H) 01/01/2014 1821   BUN 24 (H) 07/19/2016 1142   BUN 25 (H) 01/01/2014 1821   CREATININE 1.21 07/19/2016 1142   CREATININE 1.20 01/01/2014 1821   CALCIUM 9.8 07/19/2016 1142   CALCIUM 9.2 01/01/2014 1821   GFRNONAA 54 (L) 07/19/2016 1142   GFRNONAA >60 01/01/2014 1821   GFRAA >60 07/19/2016 1142   GFRAA >60 01/01/2014 1821   CrCl cannot be calculated (Patient's most recent lab result is older than the maximum 21 days allowed.).  COAG No results found for: INR, PROTIME  Radiology No results found.   Assessment/Plan 1. Bilateral carotid artery stenosis Recommend:  Given the patient's asymptomatic subcritical stenosis no further invasive testing or surgery at this time.  Duplex ultrasound shows <70% stenosis bilaterally.  Continue antiplatelet therapy as prescribed Continue management of CAD, HTN and Hyperlipidemia Healthy heart  diet,  encouraged exercise at least 4 times per week Follow up in 6 months with duplex ultrasound and physical exam.  -  VAS US CAROTID; Future  2. Chronic venous insufficiency No surgery or intervention at this point in time.    I have had a long discussion with the patient regarding venous insufficiency and why it  causes symptoms. I have discussed with the patient the chronic skin changes that accompany venous insufficiency and the long term sequela such as infection and ulceration.  Patient will begin wearing graduated compression stockings class 1 (20-30 mmHg) or compression wraps on a daily basis a prescription was given. The patient will put the stockings on first thing in the morning and removing them in the evening. The patient is instructed specifically not to sleep in the stockings.    In addition, behavioral modification including several periods of elevation of the lower extremities during the day will be continued. I have demonstrated that proper elevation is a position with the ankles at heart level.  The patient is instructed to begin routine exercise, especially walking on a daily basis  Following the review of the ultrasound the patient will follow up in 6 months to reassess the degree of swelling and the control that graduated compression stockings or compression wraps  is offering.   The patient can be assessed for a Lymph Pump at that time  3. HTN (hypertension), benign Continue antihypertensive medications as already ordered, these medications have been reviewed and there are no changes at this time.   4. Type 2 diabetes mellitus without complication, unspecified whether long term insulin use ([place]) Continue hypoglycemic medications as already ordered, these medications have been reviewed and there are no changes at this time.  Hgb A1C to be monitored as already arranged by primary service   5. Hypercholesteremia Continue statin as ordered and reviewed, no changes at  this time     Hortencia Pilar, MD  10/23/2019 2:00 PM

## 2019-10-31 ENCOUNTER — Other Ambulatory Visit: Payer: Self-pay | Admitting: Urology

## 2019-10-31 DIAGNOSIS — Z87442 Personal history of urinary calculi: Secondary | ICD-10-CM

## 2019-11-01 ENCOUNTER — Other Ambulatory Visit: Payer: Self-pay | Admitting: Urology

## 2019-11-07 ENCOUNTER — Other Ambulatory Visit
Admission: RE | Admit: 2019-11-07 | Discharge: 2019-11-07 | Disposition: A | Discharge status: Home / Self Care | Payer: Medicare HMO | Source: Ambulatory Visit | Attending: Internal Medicine | Admitting: Internal Medicine

## 2019-11-07 ENCOUNTER — Other Ambulatory Visit: Payer: Self-pay

## 2019-11-07 DIAGNOSIS — Z20822 Contact with and (suspected) exposure to covid-19: Secondary | ICD-10-CM | POA: Insufficient documentation

## 2019-11-07 DIAGNOSIS — Z01812 Encounter for preprocedural laboratory examination: Secondary | ICD-10-CM | POA: Diagnosis present

## 2019-11-07 LAB — SARS CORONAVIRUS 2 (TAT 6-24 HRS): SARS Coronavirus 2: NEGATIVE

## 2019-11-09 ENCOUNTER — Encounter: Payer: Self-pay | Admitting: Internal Medicine

## 2019-11-09 ENCOUNTER — Encounter
Admission: RE | Disposition: A | Discharge status: Home / Self Care | Payer: Self-pay | Source: Home / Self Care | Attending: Internal Medicine

## 2019-11-09 ENCOUNTER — Ambulatory Visit
Admission: RE | Admit: 2019-11-09 | Discharge: 2019-11-09 | Disposition: A | Discharge status: Home / Self Care | Payer: Medicare HMO | Attending: Internal Medicine | Admitting: Internal Medicine

## 2019-11-09 ENCOUNTER — Other Ambulatory Visit: Payer: Self-pay

## 2019-11-09 DIAGNOSIS — K219 Gastro-esophageal reflux disease without esophagitis: Secondary | ICD-10-CM | POA: Insufficient documentation

## 2019-11-09 DIAGNOSIS — I480 Paroxysmal atrial fibrillation: Secondary | ICD-10-CM | POA: Diagnosis not present

## 2019-11-09 DIAGNOSIS — Z888 Allergy status to other drugs, medicaments and biological substances status: Secondary | ICD-10-CM | POA: Diagnosis not present

## 2019-11-09 DIAGNOSIS — Z91040 Latex allergy status: Secondary | ICD-10-CM | POA: Insufficient documentation

## 2019-11-09 DIAGNOSIS — Z6831 Body mass index (BMI) 31.0-31.9, adult: Secondary | ICD-10-CM | POA: Diagnosis not present

## 2019-11-09 DIAGNOSIS — G4733 Obstructive sleep apnea (adult) (pediatric): Secondary | ICD-10-CM | POA: Diagnosis not present

## 2019-11-09 DIAGNOSIS — I25110 Atherosclerotic heart disease of native coronary artery with unstable angina pectoris: Secondary | ICD-10-CM | POA: Insufficient documentation

## 2019-11-09 DIAGNOSIS — I129 Hypertensive chronic kidney disease with stage 1 through stage 4 chronic kidney disease, or unspecified chronic kidney disease: Secondary | ICD-10-CM | POA: Insufficient documentation

## 2019-11-09 DIAGNOSIS — E669 Obesity, unspecified: Secondary | ICD-10-CM | POA: Insufficient documentation

## 2019-11-09 DIAGNOSIS — Z87891 Personal history of nicotine dependence: Secondary | ICD-10-CM | POA: Diagnosis not present

## 2019-11-09 DIAGNOSIS — E1122 Type 2 diabetes mellitus with diabetic chronic kidney disease: Secondary | ICD-10-CM | POA: Diagnosis not present

## 2019-11-09 DIAGNOSIS — I200 Unstable angina: Secondary | ICD-10-CM

## 2019-11-09 DIAGNOSIS — I2582 Chronic total occlusion of coronary artery: Secondary | ICD-10-CM | POA: Insufficient documentation

## 2019-11-09 DIAGNOSIS — N1831 Chronic kidney disease, stage 3a: Secondary | ICD-10-CM | POA: Diagnosis not present

## 2019-11-09 DIAGNOSIS — Z882 Allergy status to sulfonamides status: Secondary | ICD-10-CM | POA: Insufficient documentation

## 2019-11-09 HISTORY — PX: LEFT HEART CATH AND CORONARY ANGIOGRAPHY: CATH118249

## 2019-11-09 SURGERY — LEFT HEART CATH AND CORONARY ANGIOGRAPHY
Anesthesia: Moderate Sedation | Laterality: Left

## 2019-11-09 MED ORDER — HEPARIN (PORCINE) IN NACL 1000-0.9 UT/500ML-% IV SOLN
INTRAVENOUS | Status: AC
  Filled 2019-11-09: qty 1000

## 2019-11-09 MED ORDER — HEPARIN SODIUM (PORCINE) 1000 UNIT/ML IJ SOLN
INTRAMUSCULAR | Status: DC | PRN
  Administered 2019-11-09: 5000 [IU] via INTRAVENOUS

## 2019-11-09 MED ORDER — HEPARIN SODIUM (PORCINE) 1000 UNIT/ML IJ SOLN
INTRAMUSCULAR | Status: AC
  Filled 2019-11-09: qty 1

## 2019-11-09 MED ORDER — VERAPAMIL HCL 2.5 MG/ML IV SOLN
INTRAVENOUS | Status: DC | PRN
  Administered 2019-11-09: 2.5 mg via INTRA_ARTERIAL

## 2019-11-09 MED ORDER — LABETALOL HCL 5 MG/ML IV SOLN: 10.0000 mg | INTRAVENOUS | Status: DC | PRN

## 2019-11-09 MED ORDER — FENTANYL CITRATE (PF) 100 MCG/2ML IJ SOLN
INTRAMUSCULAR | Status: DC | PRN
  Administered 2019-11-09: 50 ug via INTRAVENOUS

## 2019-11-09 MED ORDER — ASPIRIN 81 MG PO CHEW
CHEWABLE_TABLET | ORAL | Status: AC
  Filled 2019-11-09: qty 1

## 2019-11-09 MED ORDER — SODIUM CHLORIDE 0.9 % WEIGHT BASED INFUSION: 1.0000 mL/kg/h | INTRAVENOUS | Status: DC

## 2019-11-09 MED ORDER — MIDAZOLAM HCL 2 MG/2ML IJ SOLN
INTRAMUSCULAR | Status: AC
  Filled 2019-11-09: qty 2

## 2019-11-09 MED ORDER — SODIUM CHLORIDE 0.9% FLUSH: 3.0000 mL | Freq: Two times a day (BID) | INTRAVENOUS | Status: DC

## 2019-11-09 MED ORDER — HEPARIN (PORCINE) IN NACL 1000-0.9 UT/500ML-% IV SOLN
INTRAVENOUS | Status: DC | PRN
  Administered 2019-11-09: 500 mL

## 2019-11-09 MED ORDER — MIDAZOLAM HCL 2 MG/2ML IJ SOLN
INTRAMUSCULAR | Status: DC | PRN
  Administered 2019-11-09: 1 mg via INTRAVENOUS

## 2019-11-09 MED ORDER — ACETAMINOPHEN 325 MG PO TABS: 650.0000 mg | ORAL_TABLET | ORAL | Status: DC | PRN

## 2019-11-09 MED ORDER — IOHEXOL 300 MG/ML  SOLN
INTRAMUSCULAR | Status: DC | PRN
  Administered 2019-11-09: 110 mL

## 2019-11-09 MED ORDER — HYDRALAZINE HCL 20 MG/ML IJ SOLN: 10.0000 mg | INTRAMUSCULAR | Status: DC | PRN

## 2019-11-09 MED ORDER — SODIUM CHLORIDE 0.9 % IV SOLN: 250.0000 mL | INTRAVENOUS | Status: DC | PRN

## 2019-11-09 MED ORDER — ONDANSETRON HCL 4 MG/2ML IJ SOLN: 4.0000 mg | Freq: Four times a day (QID) | INTRAMUSCULAR | Status: DC | PRN

## 2019-11-09 MED ORDER — SODIUM CHLORIDE 0.9% FLUSH: 3.0000 mL | INTRAVENOUS | Status: DC | PRN

## 2019-11-09 MED ORDER — FENTANYL CITRATE (PF) 100 MCG/2ML IJ SOLN
INTRAMUSCULAR | Status: AC
  Filled 2019-11-09: qty 2

## 2019-11-09 MED ORDER — ASPIRIN 81 MG PO CHEW: 81.0000 mg | CHEWABLE_TABLET | ORAL | Status: DC

## 2019-11-09 MED ORDER — SODIUM CHLORIDE 0.9 % WEIGHT BASED INFUSION
300.0000 mL/h | INTRAVENOUS | Status: AC
  Administered 2019-11-09: 300 mL/h via INTRAVENOUS

## 2019-11-09 MED ORDER — VERAPAMIL HCL 2.5 MG/ML IV SOLN
INTRAVENOUS | Status: AC
  Filled 2019-11-09: qty 2

## 2019-11-09 MED ORDER — SODIUM CHLORIDE 0.9 % WEIGHT BASED INFUSION
100.0000 mL/h | INTRAVENOUS | Status: DC
  Administered 2019-11-09: 100 mL/h via INTRAVENOUS

## 2019-11-09 SURGICAL SUPPLY — 12 items
CATH INFINITI 5 FR JL3.5 (CATHETERS) ×3 IMPLANT
CATH INFINITI 5FR JL4 (CATHETERS) ×3 IMPLANT
CATH INFINITI JR4 5F (CATHETERS) ×3 IMPLANT
DEVICE RAD COMP TR BAND LRG (VASCULAR PRODUCTS) ×3 IMPLANT
GLIDESHEATH SLEND SS 6F .021 (SHEATH) ×3 IMPLANT
GUIDEWIRE INQWIRE 1.5J.035X260 (WIRE) ×1 IMPLANT
INQWIRE 1.5J .035X260CM (WIRE) ×3
PACK CARDIAC CATH (CUSTOM PROCEDURE TRAY) ×3 IMPLANT
PROTECTION STATION PRESSURIZED (MISCELLANEOUS) ×3
SET ATX SIMPLICITY (MISCELLANEOUS) ×3 IMPLANT
STATION PROTECTION PRESSURIZED (MISCELLANEOUS) ×1 IMPLANT
WIRE HITORQ VERSACORE ST 145CM (WIRE) ×3 IMPLANT

## 2019-11-09 NOTE — Progress Notes (Signed)
Patient presents for outpatient cardiac cath because of unstable anginal symptoms Patient most recently seen in the office September 28 he been having recurrent unstable anginal symptoms not controlled with medical therapy so finally agreed to proceed with cardiac catheter further evaluation and management Denies any significant shortness of breath no blackout spells or syncope Risks and benefits of cardiac catheter been discussed and he is agreed to proceed with the procedure  Physical exam Essentially unchanged from the initial H&P in the office Lungs are clear Heart exam regular rate rhythm systolic ejection murmur Abdominal benign All pulses are normal  Laboratories as described previously procedure  Plan Proceed with cardiac cath for evaluation of unstable anginal symptoms with intention to treat with PCI and stent if necessary Clinical condition patient.  Physical exam has not changed since his last clinic visit

## 2019-11-09 NOTE — Progress Notes (Signed)
D/C instructions reviewed with Maudie Mercury, pt caregiver.  Pt discharged via wheelchair to personal vehicle with Maudie Mercury.  Pt alert/oriented, vss at time of discharge.  Personal belongings returned to pt.  Right radial dressing c/d/i/

## 2019-11-09 NOTE — Discharge Instructions (Signed)
Radial Site Care Refer to this sheet in the next few weeks. These instructions provide you with information about caring for yourself after your procedure. Your health care provider may also give you more specific instructions. Your treatment has been planned according to current medical practices, but problems sometimes occur. Call your health care provider if you have any problems or questions after your procedure. What can I expect after the procedure? After your procedure, it is typical to have the following: Bruising at the radial site that usually fades within 1-2 weeks. Blood collecting in the tissue (hematoma) that may be painful to the touch. It should usually decrease in size and tenderness within 1-2 weeks.  Follow these instructions at home: Take medicines only as directed by your health care provider. If you are on a medication called Metformin please do not take for 48 hours after your procedure. Over the next 48hrs please increase your fluid intake of water and non caffeine beverages to flush the contrast dye out of your system.  You may shower 24 hours after the procedure  Leave your bandage on and gently wash the site with plain soap and water. Pat the area dry with a clean towel. Do not rub the site, because this may cause bleeding.  Remove your dressing 48hrs after your procedure and leave open to air.  Do not submerge your site in water for 7 days. This includes swimming and washing dishes.  Check your insertion site every day for redness, swelling, or drainage. Do not apply powder or lotion to the site. Do not flex or bend the affected arm for 24 hours or as directed by your health care provider. Do not push or pull heavy objects with the affected arm for 24 hours or as directed by your health care provider. Do not lift over 10 lb (4.5 kg) for 5 days after your procedure or as directed by your health care provider. Ask your health care provider when it is okay to: Return to  work or school. Resume usual physical activities or sports. Resume sexual activity. Do not drive home if you are discharged the same day as the procedure. Have someone else drive you. You may drive 48 hours after the procedure Do not operate machinery or power tools for 24 hours after the procedure. If your procedure was done as an outpatient procedure, which means that you went home the same day as your procedure, a responsible adult should be with you for the first 24 hours after you arrive home. Keep all follow-up visits as directed by your health care provider. This is important. Contact a health care provider if: You have a fever. You have chills. You have increased bleeding from the radial site. Hold pressure on the site. Get help right away if: You have unusual pain at the radial site. You have redness, warmth, or swelling at the radial site. You have drainage (other than a small amount of blood on the dressing) from the radial site. The radial site is bleeding, and the bleeding does not stop after 15 minutes of holding steady pressure on the site. Your arm or hand becomes pale, cool, tingly, or numb. This information is not intended to replace advice given to you by your health care provider. Make sure you discuss any questions you have with your health care provider. Document Released: 01/31/2010 Document Revised: 06/06/2015 Document Reviewed: 07/17/2013 Elsevier Interactive Patient Education  2018 [employer]  

## 2019-11-09 NOTE — Progress Notes (Signed)
Dr. Clayborn Bigness at bedside discussing test results with pt and pt caregiver.

## 2019-11-10 ENCOUNTER — Other Ambulatory Visit: Admission: RE | Admit: 2019-11-10 | Payer: Medicare HMO | Source: Ambulatory Visit

## 2019-11-13 ENCOUNTER — Encounter: Payer: Self-pay | Admitting: Internal Medicine

## 2019-11-16 ENCOUNTER — Encounter: Payer: Self-pay | Admitting: Podiatry

## 2019-11-16 ENCOUNTER — Ambulatory Visit (INDEPENDENT_AMBULATORY_CARE_PROVIDER_SITE_OTHER): Payer: Medicare HMO | Admitting: Podiatry

## 2019-11-16 ENCOUNTER — Other Ambulatory Visit: Payer: Self-pay

## 2019-11-16 DIAGNOSIS — M79675 Pain in left toe(s): Secondary | ICD-10-CM

## 2019-11-16 DIAGNOSIS — E1165 Type 2 diabetes mellitus with hyperglycemia: Secondary | ICD-10-CM

## 2019-11-16 DIAGNOSIS — B351 Tinea unguium: Secondary | ICD-10-CM

## 2019-11-16 DIAGNOSIS — M79674 Pain in right toe(s): Secondary | ICD-10-CM | POA: Diagnosis not present

## 2019-11-16 NOTE — Progress Notes (Signed)
This patient returns to my office for at risk foot care.  This patient requires this care by a professional since this patient will be at risk due to having diabetes.  Patient presents to the office with his daughter.  This patient is unable to cut nails himself since the patient cannot reach his nails.These nails are painful walking and wearing shoes.  This patient presents for at risk foot care today.  General Appearance  Alert, conversant and in no acute stress.  Vascular  Dorsalis pedis and posterior tibial  pulses are palpable  Right foot.  Dorsalis pedis and posterior tibial pulses are absent  B/L.  Marland Kitchen  Capillary return is within normal limits  bilaterally. Temperature is within normal limits  bilaterally.  Neurologic  Senn-Weinstein monofilament wire test within normal limits  bilaterally. Muscle power within normal limits bilaterally.  Nails Thick disfigured discolored nails with subungual debris  from hallux to fifth toes bilaterally. No evidence of bacterial infection or drainage bilaterally. Disfigured discolored  Hallux nail right hallux  Following previous nail surgery.  Orthopedic  No limitations of motion  feet .  No crepitus or effusions noted.  No bony pathology or digital deformities noted.  Skin  normotropic skin with no porokeratosis noted bilaterally.  No signs of infections or ulcers noted.  Skin necrosis distal tip second toe left foot.   Onychomycosis  Pain in right toes  Pain in left toes  Consent was obtained for treatment procedures.   Mechanical debridement of nails 1-5  bilaterally performed with a nail nipper.  Filed with dremel without incident.    Return office visit     3 months.                Told patient to return for periodic foot care and evaluation due to potential at risk complications.   Gardiner Barefoot DPM

## 2019-11-20 ENCOUNTER — Other Ambulatory Visit: Payer: Self-pay | Admitting: Urology

## 2019-11-20 ENCOUNTER — Telehealth: Payer: Self-pay | Admitting: Urology

## 2019-11-20 MED ORDER — TAMSULOSIN HCL 0.4 MG PO CAPS
0.4000 mg | ORAL_CAPSULE | Freq: Every day | ORAL | 0 refills | Status: DC
Start: 2019-11-20 — End: 2019-12-21

## 2019-11-20 NOTE — Telephone Encounter (Signed)
Pt called and wanted a refill for Tamsulosin sent to [facility] on [address].  He made in upcoming for 12/2 and wants to know if you will send him a 1 month refill to last til appt.

## 2019-11-20 NOTE — Telephone Encounter (Signed)
1 month refill sent to pharmacy.  

## 2019-11-20 NOTE — Telephone Encounter (Signed)
That's fine to refill his Flomax for one month.

## 2019-12-14 ENCOUNTER — Ambulatory Visit: Payer: Medicare HMO | Admitting: Urology

## 2019-12-15 ENCOUNTER — Other Ambulatory Visit: Payer: Self-pay | Admitting: Urology

## 2019-12-20 NOTE — Progress Notes (Signed)
12/21/2019 3:26 PM   Jared Ball 1932/05/26 [id number]  Referring provider: Kirk Ruths, MD [place],  [place] [address]  Chief Complaint  Patient presents with   Benign Prostatic Hypertrophy    HPI: Patient is an 86 year old male who presents today for a follow up for BPH with LUTS and a history of nephrolithiasis who presents for follow up with his CNA, Amy.  BPH WITH LUTS His IPSS score today is 12, which is moderate lower urinary tract symptomatology.  He is mostly satisfied with his quality life due to his urinary symptoms.  His previous I PSS score was 14/4.  His major complaint(s) today are/is leakage of urine and a weak urinary stream.   Patient denies any modifying or aggravating factors.  Patient denies any gross hematuria, dysuria or suprapubic/flank pain.  Patient denies any fevers, chills, nausea or vomiting.    IPSS    Row Name 12/21/19 1100         International Prostate Symptom Score   How often have you had the sensation of not emptying your bladder? Less than half the time     How often have you had to urinate less than every two hours? Less than half the time     How often have you found you stopped and started again several times when you urinated? Less than half the time     How often have you found it difficult to postpone urination? Less than half the time     How often have you had a weak urinary stream? About half the time     How often have you had to strain to start urination? Not at All     How many times did you typically get up at night to urinate? 1 Time     Total IPSS Score 12           Quality of Life due to urinary symptoms   If you were to spend the rest of your life with your urinary condition just the way it is now how would you feel about that? Mostly Satisfied            Score:  1-7 Mild 8-19 Moderate 20-35 Severe    History of nephrolithiasis Underwent left URS in 08/2014  for 2 non obstructing stones, measuring up to 11 mm.  KUB taken 08/2015 does not demonstrate any urinary stones. No recent passage of fragments or flank pain.   PMH: Past Medical History:  Diagnosis Date   Anxiety    Arthritis    Atrial fibrillation (HCC)    BPH (benign prostatic hyperplasia)    Bronchitis    Chronic kidney disease    Cognitive changes    Diabetes mellitus without complication (HCC)    Type 2   Dysrhythmia    GERD (gastroesophageal reflux disease)    Headache    History of kidney stones    hx of   HOH (hard of hearing)    Hypercholesteremia    Hypertension    IBS (irritable bowel syndrome)    PONV (postoperative nausea and vomiting)    Shingles 15 yrs ago   HX of   Shortness of breath dyspnea    heart related   Sleep apnea    no CPAP   Spinal stenosis    Vertigo    Vitamin D deficiency     Surgical History: Past Surgical History:  Procedure  Laterality Date   CATARACT EXTRACTION W/PHACO Left 05/17/2019   Procedure: CATARACT EXTRACTION PHACO AND INTRAOCULAR LENS PLACEMENT ([place]) LEFT DIABETIC MALYUGIN;  Surgeon: Leandrew Koyanagi, MD;  Location: [facility];  Service: Ophthalmology;  Laterality: Left;  16.54 1:31.1 18.2%   CATARACT EXTRACTION W/PHACO Right 07/12/2019   Procedure: CATARACT EXTRACTION PHACO AND INTRAOCULAR LENS PLACEMENT ([facility]) RIGHT MALYUGIN DIABETIC 14.90 01:37.8 15.3%;  Surgeon: Leandrew Koyanagi, MD;  Location: [facility];  Service: Ophthalmology;  Laterality: Right;  Diabetic - oral meds Latex   COLONOSCOPY     CYSTOSCOPY W/ RETROGRADES Bilateral 07/02/2014   Procedure: CYSTOSCOPY WITH RETROGRADE PYELOGRAM;  Surgeon: Hollice Espy, MD;  Location: [facility];  Service: Urology;  Laterality: Bilateral;   CYSTOSCOPY WITH STENT PLACEMENT Left 07/02/2014   Procedure: CYSTOSCOPY WITH STENT PLACEMENT;  Surgeon: Hollice Espy, MD;  Location: [facility];  Service: Urology;  Laterality: Left;    DENTAL SURGERY     in the [place] CATH AND CORONARY ANGIOGRAPHY Left 11/09/2019   Procedure: LEFT HEART CATH AND CORONARY ANGIOGRAPHY with Radial Approach;  Surgeon: Yolonda Kida, MD;  Location: [facility];  Service: Cardiovascular;  Laterality: Left;   TONSILLECTOMY     and adenoids   URETEROSCOPY WITH HOLMIUM LASER LITHOTRIPSY Left 07/02/2014   Procedure: URETEROSCOPY WITH HOLMIUM LASER LITHOTRIPSY;  Surgeon: Hollice Espy, MD;  Location: [facility];  Service: Urology;  Laterality: Left;    Home Medications:  Allergies as of 12/21/2019      Reactions   Latex Itching, Rash   ekg lead adhesive ? If left on to long Latex exam gloves   Sulfa Antibiotics Nausea And Vomiting, Other (See Comments)   Tape Rash   adhesive   Metformin Diarrhea   High dose gives diarrhea High dose gives diarrhea   Sulfasalazine Nausea Only, Nausea And Vomiting   Zoster Vaccine Live Hives, Rash   Localized; Vaccine for shingles localized Localized; Vaccine for shingles      Medication List       Accurate as of December 21, 2019  3:26 PM. If you have any questions, ask your nurse or doctor.        STOP taking these medications   dextromethorphan 30 MG/5ML liquid Commonly known as: DELSYM Stopped by: Zara Council, PA-C   fesoterodine 4 MG Tb24 tablet Commonly known as: TOVIAZ Stopped by: Ewan Grau, PA-C   tadalafil 5 MG tablet Commonly known as: CIALIS Stopped by: Zara Council, PA-C     TAKE these medications   acetaminophen 500 MG tablet Commonly known as: TYLENOL Take 500 mg by mouth every 8 (eight) hours as needed for mild pain or moderate pain.   amiodarone 100 MG tablet Commonly known as: PACERONE Take by mouth.   aspirin EC 81 MG tablet Take 81 mg by mouth daily.   carboxymethylcellulose 0.5 % Soln Commonly known as: REFRESH PLUS Place 1 drop into both eyes 3 (three) times daily as needed (dry eye).   Cholecalciferol 25 MCG (1000 UT)  tablet Take 1,000 Units by mouth daily.   finasteride 5 MG tablet Commonly known as: PROSCAR Take 1 tablet (5 mg total) by mouth daily.   flecainide 100 MG tablet Commonly known as: TAMBOCOR Take 100 mg by mouth 2 (two) times daily.   fluticasone 50 MCG/ACT nasal spray Commonly known as: FLONASE Place 1 spray into the nose daily as needed for allergies.   glucose blood test strip   isosorbide mononitrate 60 MG 24  hr tablet Commonly known as: IMDUR Take 60 mg by mouth daily.   loperamide 2 MG tablet Commonly known as: IMODIUM A-D Take 2 mg by mouth 3 (three) times daily as needed for diarrhea or loose stools.   lovastatin 40 MG tablet Commonly known as: MEVACOR Take 40 mg by mouth at bedtime.   metFORMIN 500 MG tablet Commonly known as: GLUCOPHAGE Take 500 mg by mouth 3 (three) times daily.   metoprolol tartrate 25 MG tablet Commonly known as: LOPRESSOR Take 25 mg by mouth daily as needed (High blood pressure).   pantoprazole 40 MG tablet Commonly known as: PROTONIX Take 40 mg by mouth daily.   polyethylene glycol 17 g packet Commonly known as: MIRALAX / GLYCOLAX Take 17 g by mouth daily as needed for severe constipation.   ranolazine 500 MG 12 hr tablet Commonly known as: RANEXA Take 500 mg by mouth 2 (two) times daily.   tamsulosin 0.4 MG Caps capsule Commonly known as: FLOMAX Take 1 capsule (0.4 mg total) by mouth daily.   torsemide 5 MG tablet Commonly known as: DEMADEX Take 5 mg by mouth daily.   Tums Ultra 1000 400 MG chewable tablet Generic drug: calcium elemental as carbonate Chew 1,000 mg by mouth daily as needed for heartburn.   Vitamin B-12 3000 MCG Subl Place 3,000 mcg under the tongue daily.       Allergies:  Allergies  Allergen Reactions   Latex Itching and Rash    ekg lead adhesive ? If left on to long Latex exam gloves   Sulfa Antibiotics Nausea And Vomiting and Other (See Comments)   Tape Rash    adhesive   Metformin  Diarrhea    High dose gives diarrhea High dose gives diarrhea   Sulfasalazine Nausea Only and Nausea And Vomiting   Zoster Vaccine Live Hives and Rash    Localized; Vaccine for shingles localized Localized; Vaccine for shingles    Family History: Family History  Problem Relation Age of Onset   Kidney disease Mother    Prostate cancer Neg Hx    Kidney cancer Neg Hx    Bladder Cancer Neg Hx     Social History:  reports that he quit smoking about 31 years ago. He has never used smokeless tobacco. He reports current alcohol use. He reports that he does not use drugs.  ROS: For pertinent review of systems please refer to history of present illness  Physical Exam: BP 114/69    Pulse 83    Ht _0  (1.778 m)    Wt 214 lb (97.1 kg)    BMI 30.71 kg/m   Constitutional:  Well nourished. Alert and oriented, No acute distress. HEENT: [place] AT, mask in place.  Trachea midline Cardiovascular: No clubbing, cyanosis, or edema. Respiratory: Normal respiratory effort, no increased work of breathing. Neurologic: Grossly intact, no focal deficits, moving all 4 extremities. Psychiatric: Normal mood and affect.  Laboratory Data: Specimen:  Blood  Ref Range & Units 1 mo ago  Glucose 70 - 110 mg/dL 115High   Sodium 136 - 145 mmol/L 137   Potassium 3.6 - 5.1 mmol/L 4.4   Chloride 97 - 109 mmol/L 103   Carbon Dioxide (CO2) 22.0 - 32.0 mmol/L 24.1   Urea Nitrogen (BUN) 7 - 25 mg/dL 27High   Creatinine 0.7 - 1.3 mg/dL 1.3   Glomerular Filtration Rate (eGFR), MDRD Estimate >60 mL/min/1.73sq m 52Low   Calcium 8.7 - 10.3 mg/dL 9.2   AST  8 -  39 U/L 14   ALT  6 - 57 U/L 9   Alk Phos (alkaline Phosphatase) 34 - 104 U/L 54   Albumin 3.5 - 4.8 g/dL 4.0   Bilirubin, Total 0.3 - 1.2 mg/dL 0.6   Protein, Total 6.1 - 7.9 g/dL 7.2   A/G Ratio 1.0 - 5.0 gm/dL 1.3   Resulting Agency  [facility]  Specimen Collected: 11/02/19 3:56 PM Last Resulted: 11/02/19 4:28 PM  Received  From: [facility]  Result Received: 11/06/19 7:30    Specimen:  Blood  Ref Range & Units 1 mo ago  WBC (White Blood Cell Count) 4.1 - 10.2 103/uL 9.1   RBC (Red Blood Cell Count) 4.69 - 6.13 106/uL 5.28   Hemoglobin 14.1 - 18.1 gm/dL 15.1   Hematocrit 40.0 - 52.0 % 47.0   MCV (Mean Corpuscular Volume) 80.0 - 100.0 fl 89.0   MCH (Mean Corpuscular Hemoglobin) 27.0 - 31.2 pg 28.6   MCHC (Mean Corpuscular Hemoglobin Concentration) 32.0 - 36.0 gm/dL 32.1   Platelet Count 150 - 450 103/uL 188   RDW-CV (Red Cell Distribution Width) 11.6 - 14.8 % 13.6   MPV (Mean Platelet Volume) 9.4 - 12.4 fl 10.9   Neutrophils 1.50 - 7.80 103/uL 5.37   Lymphocytes 1.00 - 3.60 103/uL 2.37   Monocytes 0.00 - 1.50 103/uL 0.97   Eosinophils 0.00 - 0.55 103/uL 0.33   Basophils 0.00 - 0.09 103/uL 0.05   Neutrophil % 32.0 - 70.0 % 59.1   Lymphocyte % 10.0 - 50.0 % 26.0   Monocyte % 4.0 - 13.0 % 10.6   Eosinophil % 1.0 - 5.0 % 3.6   Basophil% 0.0 - 2.0 % 0.5   Immature Granulocyte % <=0.7 % 0.2   Immature Granulocyte Count <=0.06 10^3/L 0.02   Resulting Agency  [facility]  Specimen Collected: 11/02/19 3:56 PM Last Resulted: 11/02/19 4:04 PM  Received From: [facility]  Result Received: 11/06/19 7:30 AM  I have reviewed the labs.   Pertinent imaging Results for orders placed or performed in visit on 12/21/19  BLADDER SCAN AMB NON-IMAGING  Result Value Ref Range   Scan Result 51 ml    Assessment & Plan:    1. BPH with LUTS I PSS score is 12/2, it is improving Continue conservative management, avoiding bladder irritants and timed voiding's No bothersome symptoms at this time Continue tamsulosin 0.4 mg daily and finasteride 5 mg daily  RTC in one year for I PSS and PVR  2. History of nephrolithiasis:    No flank pain or passage of fragments  3. History of urinary retention:   Resolved.    4. ED Is not sexually active at this time as  he and his wife are separated by units at the [facility] and cannot physically interact   Return in about 1 year (around 12/20/2020) for IPSS and PVR.  These notes generated with voice recognition software. I apologize for typographical errors.  Zara Council, PA-C  [facility] [address] [place], [place] [address] [phone]

## 2019-12-21 ENCOUNTER — Encounter: Payer: Self-pay | Admitting: Urology

## 2019-12-21 ENCOUNTER — Ambulatory Visit (INDEPENDENT_AMBULATORY_CARE_PROVIDER_SITE_OTHER): Payer: Medicare HMO | Admitting: Urology

## 2019-12-21 ENCOUNTER — Other Ambulatory Visit: Payer: Self-pay

## 2019-12-21 VITALS — BP 114/69 | HR 83 | Ht 70.0 in | Wt 214.0 lb

## 2019-12-21 DIAGNOSIS — N401 Enlarged prostate with lower urinary tract symptoms: Secondary | ICD-10-CM

## 2019-12-21 DIAGNOSIS — N138 Other obstructive and reflux uropathy: Secondary | ICD-10-CM | POA: Diagnosis not present

## 2019-12-21 DIAGNOSIS — Z87442 Personal history of urinary calculi: Secondary | ICD-10-CM

## 2019-12-21 LAB — BLADDER SCAN AMB NON-IMAGING: Scan Result: 51

## 2019-12-21 MED ORDER — FINASTERIDE 5 MG PO TABS: 5.0000 mg | ORAL_TABLET | Freq: Every day | ORAL | 3 refills | Status: DC

## 2019-12-21 MED ORDER — TAMSULOSIN HCL 0.4 MG PO CAPS: 0.4000 mg | ORAL_CAPSULE | Freq: Every day | ORAL | 3 refills | Status: DC

## 2020-02-22 ENCOUNTER — Ambulatory Visit: Payer: Medicare HMO | Admitting: Podiatry

## 2020-04-04 ENCOUNTER — Other Ambulatory Visit: Payer: Self-pay

## 2020-04-04 ENCOUNTER — Encounter: Payer: Self-pay | Admitting: Podiatry

## 2020-04-04 ENCOUNTER — Ambulatory Visit (INDEPENDENT_AMBULATORY_CARE_PROVIDER_SITE_OTHER): Payer: Medicare HMO | Admitting: Podiatry

## 2020-04-04 DIAGNOSIS — D689 Coagulation defect, unspecified: Secondary | ICD-10-CM

## 2020-04-04 DIAGNOSIS — I872 Venous insufficiency (chronic) (peripheral): Secondary | ICD-10-CM

## 2020-04-04 DIAGNOSIS — E1165 Type 2 diabetes mellitus with hyperglycemia: Secondary | ICD-10-CM

## 2020-04-04 DIAGNOSIS — M79675 Pain in left toe(s): Secondary | ICD-10-CM

## 2020-04-04 DIAGNOSIS — M79674 Pain in right toe(s): Secondary | ICD-10-CM

## 2020-04-04 DIAGNOSIS — B351 Tinea unguium: Secondary | ICD-10-CM | POA: Diagnosis not present

## 2020-04-04 DIAGNOSIS — N1831 Chronic kidney disease, stage 3a: Secondary | ICD-10-CM

## 2020-04-04 NOTE — Progress Notes (Signed)
This patient returns to my office for at risk foot care.  This patient requires this care by a professional since this patient will be at risk due to having diabetes.  Patient presents to the office with his daughter.  This patient is unable to cut nails himself since the patient cannot reach his nails.These nails are painful walking and wearing shoes.  This patient presents for at risk foot care today.  General Appearance  Alert, conversant and in no acute stress.  Vascular  Dorsalis pedis  pulses are palpable  B/L.Marland Kitchen  Posterior tibial pulses are absent due to swelling   B/L.  Marland Kitchen  Capillary return is within normal limits  bilaterally. Temperature is within normal limits  Bilaterally. Absent digital hair  B/L.,  Neurologic  Senn-Weinstein monofilament wire test within normal limits  bilaterally. Muscle power within normal limits bilaterally.  Nails Thick disfigured discolored nails with subungual debris  from hallux to fifth toes bilaterally. No evidence of bacterial infection or drainage bilaterally. Disfigured discolored    Orthopedic  No limitations of motion  feet .  No crepitus or effusions noted.  No bony pathology or digital deformities noted.  Skin  normotropic skin with no porokeratosis noted bilaterally.  No signs of infections or ulcers noted.  Skin necrosis distal tip second toe left foot.   Onychomycosis  Pain in right toes  Pain in left toes  Consent was obtained for treatment procedures.   Mechanical debridement of nails 1-5  bilaterally performed with a nail nipper.  Filed with dremel without incident.    Return office visit     3 months.                Told patient to return for periodic foot care and evaluation due to potential at risk complications.   Gardiner Barefoot DPM

## 2020-04-22 ENCOUNTER — Ambulatory Visit (INDEPENDENT_AMBULATORY_CARE_PROVIDER_SITE_OTHER): Payer: Medicare HMO

## 2020-04-22 ENCOUNTER — Ambulatory Visit (INDEPENDENT_AMBULATORY_CARE_PROVIDER_SITE_OTHER): Payer: Medicare HMO | Admitting: Vascular Surgery

## 2020-04-22 ENCOUNTER — Other Ambulatory Visit: Payer: Self-pay

## 2020-04-22 VITALS — BP 111/58 | HR 76 | Ht 70.0 in | Wt 216.0 lb

## 2020-04-22 DIAGNOSIS — I6523 Occlusion and stenosis of bilateral carotid arteries: Secondary | ICD-10-CM

## 2020-04-22 DIAGNOSIS — I872 Venous insufficiency (chronic) (peripheral): Secondary | ICD-10-CM | POA: Diagnosis not present

## 2020-04-22 DIAGNOSIS — I10 Essential (primary) hypertension: Secondary | ICD-10-CM

## 2020-04-22 DIAGNOSIS — I482 Chronic atrial fibrillation, unspecified: Secondary | ICD-10-CM

## 2020-04-22 DIAGNOSIS — E119 Type 2 diabetes mellitus without complications: Secondary | ICD-10-CM

## 2020-04-22 NOTE — Progress Notes (Signed)
MRN : [id number]  Jared Ball is a 87 y.o. (Jul 03, 1932) male who presents with chief complaint of No chief complaint on file. Marland Kitchen  History of Present Illness:   The patient is seen for evaluation of carotid stenosis.  The patient notes several visual disturbances since his last visit.  They involve the left eye unilaterally.  It appears fleeting and unpredictable.  Episodes resolved within 5 to 30 minutes.  There is no recent history of TIA symptoms or focal motor deficits. There is no prior documented CVA.  There is no history of migraine headaches. There is no history of seizures.  The patient is taking enteric-coated aspirin 81 mg daily.  The patient has a history of coronary artery disease, no recent episodes of angina or shortness of breath. The patient denies PAD or claudication symptoms. There is a history of hyperlipidemia which is being treated with a statin.  Duplex ultrasound of the carotid arteries obtained today shows right ICA 1 to 39% stenosis and left ICA 80 to 99% stenosis.  There has been progression on the left side since the previous study.   No outpatient medications have been marked as taking for the 04/22/20 encounter (Appointment) with Delana Meyer, Dolores Lory, MD.    Past Medical History:  Diagnosis Date  . Anxiety   . Arthritis   . Atrial fibrillation ([place])   . BPH (benign prostatic hyperplasia)   . Bronchitis   . Chronic kidney disease   . Cognitive changes   . Diabetes mellitus without complication (HCC)    Type 2  . Dysrhythmia   . GERD (gastroesophageal reflux disease)   . Headache   . History of kidney stones    hx of  . HOH (hard of hearing)   . Hypercholesteremia   . Hypertension   . IBS (irritable bowel syndrome)   . PONV (postoperative nausea and vomiting)   . Shingles 15 yrs ago   HX of  . Shortness of breath dyspnea    heart related  . Sleep apnea    no CPAP  . Spinal stenosis   . Vertigo   . Vitamin D deficiency     Past  Surgical History:  Procedure Laterality Date  . CATARACT EXTRACTION W/PHACO Left 05/17/2019   Procedure: CATARACT EXTRACTION PHACO AND INTRAOCULAR LENS PLACEMENT ([place]) LEFT DIABETIC MALYUGIN;  Surgeon: Leandrew Koyanagi, MD;  Location: [facility];  Service: Ophthalmology;  Laterality: Left;  16.54 1:31.1 18.2%  . CATARACT EXTRACTION W/PHACO Right 07/12/2019   Procedure: CATARACT EXTRACTION PHACO AND INTRAOCULAR LENS PLACEMENT ([facility]) RIGHT MALYUGIN DIABETIC 14.90 01:37.8 15.3%;  Surgeon: Leandrew Koyanagi, MD;  Location: [facility];  Service: Ophthalmology;  Laterality: Right;  Diabetic - oral meds Latex  . COLONOSCOPY    . CYSTOSCOPY W/ RETROGRADES Bilateral 07/02/2014   Procedure: CYSTOSCOPY WITH RETROGRADE PYELOGRAM;  Surgeon: Hollice Espy, MD;  Location: [facility];  Service: Urology;  Laterality: Bilateral;  . CYSTOSCOPY WITH STENT PLACEMENT Left 07/02/2014   Procedure: CYSTOSCOPY WITH STENT PLACEMENT;  Surgeon: Hollice Espy, MD;  Location: [facility];  Service: Urology;  Laterality: Left;  . DENTAL SURGERY     in the [employer]  . LEFT HEART CATH AND CORONARY ANGIOGRAPHY Left 11/09/2019   Procedure: LEFT HEART CATH AND CORONARY ANGIOGRAPHY with Radial Approach;  Surgeon: Yolonda Kida, MD;  Location: [facility];  Service: Cardiovascular;  Laterality: Left;  . TONSILLECTOMY     and adenoids  . URETEROSCOPY WITH HOLMIUM LASER LITHOTRIPSY Left  07/02/2014   Procedure: URETEROSCOPY WITH HOLMIUM LASER LITHOTRIPSY;  Surgeon: Hollice Espy, MD;  Location: [facility];  Service: Urology;  Laterality: Left;    Social History Social History   Tobacco Use  . Smoking status: Former Smoker    Quit date: 1990    Years since quitting: 32.2  . Smokeless tobacco: Never Used  Substance Use Topics  . Alcohol use: Yes    Alcohol/week: 0.0 standard drinks    Comment: red wine occasionally  . Drug use: No    Family History Family History  Problem Relation Age of  Onset  . Kidney disease Mother   . Prostate cancer Neg Hx   . Kidney cancer Neg Hx   . Bladder Cancer Neg Hx     Allergies  Allergen Reactions  . Latex Itching and Rash    ekg lead adhesive ? If left on to long Latex exam gloves  . Sulfa Antibiotics Nausea And Vomiting and Other (See Comments)  . Tape Rash    adhesive  . Metformin Diarrhea    High dose gives diarrhea High dose gives diarrhea  . Sulfasalazine Nausea Only and Nausea And Vomiting  . Zoster Vaccine Live Hives and Rash    Localized; Vaccine for shingles localized Localized; Vaccine for shingles     REVIEW OF SYSTEMS (Negative unless checked)  Constitutional: [] Weight loss  [] Fever  [] Chills Cardiac: [] Chest pain   [] Chest pressure   [] Palpitations   [] Shortness of breath when laying flat   [] Shortness of breath with exertion. Vascular:  [] Pain in legs with walking   [] Pain in legs at rest  [] History of DVT   [] Phlebitis   [x] Swelling in legs   [] Varicose veins   [] Non-healing ulcers Pulmonary:   [] Uses home oxygen   [] Productive cough   [] Hemoptysis   [] Wheeze  [] COPD   [] Asthma Neurologic:  [] Dizziness   [] Seizures   [x] History of stroke   [x] History of TIA  [x] Aphasia   [] Vissual changes   [] Weakness or numbness in arm   [] Weakness or numbness in leg Musculoskeletal:   [] Joint swelling   [x] Joint pain   [x] Low back pain Hematologic:  [] Easy bruising  [] Easy bleeding   [] Hypercoagulable state   [] Anemic Gastrointestinal:  [] Diarrhea   [] Vomiting  [] Gastroesophageal reflux/heartburn   [] Difficulty swallowing. Genitourinary:  [] Chronic kidney disease   [] Difficult urination  [] Frequent urination   [] Blood in urine Skin:  [] Rashes   [] Ulcers  Psychological:  [] History of anxiety   []  History of major depression.  Physical Examination  There were no vitals filed for this visit. There is no height or weight on file to calculate BMI. Gen: WD/WN, NAD Head: [place]/AT, No temporalis wasting.  Ear/Nose/Throat: Hearing  grossly intact, nares w/o erythema or drainage Eyes: PER, EOMI, sclera nonicteric.  Neck: Supple, no large masses.   Pulmonary:  Good air movement, no audible wheezing bilaterally, no use of accessory muscles.  Cardiac: RRR, no JVD Vascular: Left carotid bruit Vessel Right Left  Radial Palpable Palpable  Brachial Palpable Palpable  Carotid Palpable Palpable  Gastrointestinal: Non-distended. No guarding/no peritoneal signs.  Musculoskeletal: M/S 5/5 throughout.  No deformity or atrophy.  Neurologic: CN 2-12 intact. Symmetrical.  Speech is fluent. Motor exam as listed above. Psychiatric: Judgment intact, Mood & affect appropriate for pt's clinical situation. Dermatologic: No rashes or ulcers noted.  No changes consistent with cellulitis.   CBC Lab Results  Component Value Date   WBC 7.6 07/19/2016   HGB 15.8 07/19/2016  HCT 48.2 07/19/2016   MCV 85.6 07/19/2016   PLT 199 07/19/2016    BMET    Component Value Date/Time   NA 137 07/19/2016 1142   NA 138 01/01/2014 1821   K 4.4 07/19/2016 1142   K 4.3 01/01/2014 1821   CL 101 07/19/2016 1142   CL 103 01/01/2014 1821   CO2 29 07/19/2016 1142   CO2 28 01/01/2014 1821   GLUCOSE 240 (H) 07/19/2016 1142   GLUCOSE 109 (H) 01/01/2014 1821   BUN 24 (H) 07/19/2016 1142   BUN 25 (H) 01/01/2014 1821   CREATININE 1.21 07/19/2016 1142   CREATININE 1.20 01/01/2014 1821   CALCIUM 9.8 07/19/2016 1142   CALCIUM 9.2 01/01/2014 1821   GFRNONAA 54 (L) 07/19/2016 1142   GFRNONAA >60 01/01/2014 1821   GFRAA >60 07/19/2016 1142   GFRAA >60 01/01/2014 1821   CrCl cannot be calculated (Patient's most recent lab result is older than the maximum 21 days allowed.).  COAG No results found for: INR, PROTIME  Radiology No results found.   Assessment/Plan 1. Bilateral carotid artery stenosis The patient remains asymptomatic with respect to the carotid stenosis.  However, the patient has now progressed and has a lesion the is  >70%.  Patient should undergo CT angiography of the carotid arteries to define the degree of stenosis of the internal carotid arteries bilaterally and the anatomic suitability for surgery vs. intervention.  If the patient does indeed need surgery cardiac clearance will be required, once cleared the patient will be scheduled for surgery.  The risks, benefits and alternative therapies were reviewed in detail with the patient.  All questions were answered.  The patient agrees to proceed with imaging.  Continue antiplatelet therapy as prescribed. Continue management of CAD, HTN and Hyperlipidemia. Healthy heart diet, encouraged exercise at least 4 times per week.   - CT ANGIO NECK W OR WO CONTRAST; Future  2. Chronic venous insufficiency  No surgery or intervention at this point in time.    I have reviewed my previous discussion with the patient regarding swelling and why it  causes symptoms.  The patient is doing well with compression and will continue wearing graduated compression stockings class 1 (20-30 mmHg) on a daily basis a prescription was given. The patient will  continue wearing the stockings first thing in the morning and removing them in the evening. The patient is instructed specifically not to sleep in the stockings.    In addition, behavioral modification including elevation during the day and exercise will be continued.     3. HTN (hypertension), benign Continue antihypertensive medications as already ordered, these medications have been reviewed and there are no changes at this time.   4. Chronic atrial fibrillation (HCC) Continue antiarrhythmia medications as already ordered, these medications have been reviewed and there are no changes at this time.  Continue anticoagulation as ordered by Cardiology Service   5. Type 2 diabetes mellitus without complication, unspecified whether long term insulin use (HCC) Continue hypoglycemic medications as already ordered, these  medications have been reviewed and there are no changes at this time.  Hgb A1C to be monitored as already arranged by primary service  5.  Coronary artery disease: Patient is status post cardiac catheterization and was told that he needs CABG.  He did follow-up at [facility] and after discussion with the cardiac surgeon he has elected not to move forward with open heart surgery.  He does attest to episodes of angina particularly with activity.  Hortencia Pilar, MD  04/22/2020 12:58 PM

## 2020-04-25 ENCOUNTER — Encounter (INDEPENDENT_AMBULATORY_CARE_PROVIDER_SITE_OTHER): Payer: Self-pay | Admitting: Vascular Surgery

## 2020-05-01 ENCOUNTER — Telehealth (INDEPENDENT_AMBULATORY_CARE_PROVIDER_SITE_OTHER): Payer: Self-pay

## 2020-05-01 ENCOUNTER — Other Ambulatory Visit (INDEPENDENT_AMBULATORY_CARE_PROVIDER_SITE_OTHER): Payer: Self-pay

## 2020-05-01 ENCOUNTER — Other Ambulatory Visit (INDEPENDENT_AMBULATORY_CARE_PROVIDER_SITE_OTHER): Payer: Self-pay | Admitting: Nurse Practitioner

## 2020-05-01 DIAGNOSIS — I6523 Occlusion and stenosis of bilateral carotid arteries: Secondary | ICD-10-CM

## 2020-05-01 NOTE — Telephone Encounter (Signed)
Orders are in, they can go to lab collection at [facility] prior to CT

## 2020-05-01 NOTE — Telephone Encounter (Signed)
Pt needs a BUN and Creatinine lab before they can have  Their CT angio  W or W/O contrast scheduled May 10 th at the outpatient imaging.

## 2020-05-01 NOTE — Telephone Encounter (Signed)
error 

## 2020-05-01 NOTE — Telephone Encounter (Signed)
I called the pt and made him aware that his CT will take place on Mat 10, 2022 at 2:30PM he needs to arrive by 2:15PM I also made the pt aware that he has no prep

## 2020-05-21 ENCOUNTER — Ambulatory Visit
Admission: RE | Admit: 2020-05-21 | Discharge: 2020-05-21 | Disposition: A | Discharge status: Home / Self Care | Payer: Medicare HMO | Source: Ambulatory Visit | Attending: Vascular Surgery | Admitting: Vascular Surgery

## 2020-05-21 ENCOUNTER — Other Ambulatory Visit: Payer: Self-pay

## 2020-05-21 DIAGNOSIS — I6523 Occlusion and stenosis of bilateral carotid arteries: Secondary | ICD-10-CM | POA: Insufficient documentation

## 2020-05-21 MED ORDER — IOHEXOL 350 MG/ML SOLN
75.0000 mL | Freq: Once | INTRAVENOUS | Status: AC | PRN
  Administered 2020-05-21: 75 mL via INTRAVENOUS

## 2020-05-22 LAB — POCT I-STAT CREATININE: Creatinine, Ser: 1.3 mg/dL — ABNORMAL HIGH (ref 0.61–1.24)

## 2020-05-23 ENCOUNTER — Ambulatory Visit (INDEPENDENT_AMBULATORY_CARE_PROVIDER_SITE_OTHER): Payer: Medicare HMO | Admitting: Vascular Surgery

## 2020-05-23 ENCOUNTER — Other Ambulatory Visit: Payer: Self-pay

## 2020-05-23 ENCOUNTER — Encounter (INDEPENDENT_AMBULATORY_CARE_PROVIDER_SITE_OTHER): Payer: Self-pay | Admitting: Vascular Surgery

## 2020-05-23 VITALS — BP 126/66 | HR 79 | Ht 70.0 in | Wt 220.0 lb

## 2020-05-23 DIAGNOSIS — E7800 Pure hypercholesterolemia, unspecified: Secondary | ICD-10-CM

## 2020-05-23 DIAGNOSIS — I6522 Occlusion and stenosis of left carotid artery: Secondary | ICD-10-CM | POA: Diagnosis not present

## 2020-05-23 DIAGNOSIS — I10 Essential (primary) hypertension: Secondary | ICD-10-CM | POA: Diagnosis not present

## 2020-05-23 DIAGNOSIS — I872 Venous insufficiency (chronic) (peripheral): Secondary | ICD-10-CM

## 2020-05-23 DIAGNOSIS — E119 Type 2 diabetes mellitus without complications: Secondary | ICD-10-CM | POA: Diagnosis not present

## 2020-05-23 NOTE — Progress Notes (Signed)
MRN : [id number]  Jared Ball is a 87 y.o. (01-13-1932) male who presents with chief complaint of No chief complaint on file. Marland Kitchen  History of Present Illness:   The patient is seen for follow up evaluation of carotid stenosis status post CT angiogram. CT scan was done 05/21/2020. Patient reports that the test went well with no problems or complications.   The patient denies interval amaurosis fugax. There is no recent or interval TIA symptoms or focal motor deficits. There is no prior documented CVA.  The patient is taking enteric-coated aspirin 81 mg daily.  There is no history of migraine headaches. There is no history of seizures.  The patient has a history of coronary artery disease, no recent episodes of angina or shortness of breath. The patient denies PAD or claudication symptoms. There is a history of hyperlipidemia which is being treated with a statin.    CT angiogram is reviewed by me personally and shows >90% stenosis consistent with calcified plaque at the origin of the left internal carotid artery.   No outpatient medications have been marked as taking for the 05/23/20 encounter (Appointment) with Delana Meyer, Dolores Lory, MD.    Past Medical History:  Diagnosis Date  . Anxiety   . Arthritis   . Atrial fibrillation ([place])   . BPH (benign prostatic hyperplasia)   . Bronchitis   . Chronic kidney disease   . Cognitive changes   . Diabetes mellitus without complication (HCC)    Type 2  . Dysrhythmia   . GERD (gastroesophageal reflux disease)   . Headache   . History of kidney stones    hx of  . HOH (hard of hearing)   . Hypercholesteremia   . Hypertension   . IBS (irritable bowel syndrome)   . PONV (postoperative nausea and vomiting)   . Shingles 15 yrs ago   HX of  . Shortness of breath dyspnea    heart related  . Sleep apnea    no CPAP  . Spinal stenosis   . Vertigo   . Vitamin D deficiency     Past Surgical History:  Procedure Laterality Date  . CATARACT  EXTRACTION W/PHACO Left 05/17/2019   Procedure: CATARACT EXTRACTION PHACO AND INTRAOCULAR LENS PLACEMENT ([place]) LEFT DIABETIC MALYUGIN;  Surgeon: Leandrew Koyanagi, MD;  Location: [facility];  Service: Ophthalmology;  Laterality: Left;  16.54 1:31.1 18.2%  . CATARACT EXTRACTION W/PHACO Right 07/12/2019   Procedure: CATARACT EXTRACTION PHACO AND INTRAOCULAR LENS PLACEMENT ([facility]) RIGHT MALYUGIN DIABETIC 14.90 01:37.8 15.3%;  Surgeon: Leandrew Koyanagi, MD;  Location: [facility];  Service: Ophthalmology;  Laterality: Right;  Diabetic - oral meds Latex  . COLONOSCOPY    . CYSTOSCOPY W/ RETROGRADES Bilateral 07/02/2014   Procedure: CYSTOSCOPY WITH RETROGRADE PYELOGRAM;  Surgeon: Hollice Espy, MD;  Location: [facility];  Service: Urology;  Laterality: Bilateral;  . CYSTOSCOPY WITH STENT PLACEMENT Left 07/02/2014   Procedure: CYSTOSCOPY WITH STENT PLACEMENT;  Surgeon: Hollice Espy, MD;  Location: [facility];  Service: Urology;  Laterality: Left;  . DENTAL SURGERY     in the [employer]  . LEFT HEART CATH AND CORONARY ANGIOGRAPHY Left 11/09/2019   Procedure: LEFT HEART CATH AND CORONARY ANGIOGRAPHY with Radial Approach;  Surgeon: Yolonda Kida, MD;  Location: [facility];  Service: Cardiovascular;  Laterality: Left;  . TONSILLECTOMY     and adenoids  . URETEROSCOPY WITH HOLMIUM LASER LITHOTRIPSY Left 07/02/2014   Procedure: URETEROSCOPY WITH HOLMIUM LASER LITHOTRIPSY;  Surgeon:  Hollice Espy, MD;  Location: [facility];  Service: Urology;  Laterality: Left;    Social History Social History   Tobacco Use  . Smoking status: Former Smoker    Quit date: 1990    Years since quitting: 32.3  . Smokeless tobacco: Never Used  Substance Use Topics  . Alcohol use: Yes    Alcohol/week: 0.0 standard drinks    Comment: red wine occasionally  . Drug use: No    Family History Family History  Problem Relation Age of Onset  . Kidney disease Mother   . Prostate cancer Neg Hx    . Kidney cancer Neg Hx   . Bladder Cancer Neg Hx     Allergies  Allergen Reactions  . Latex Itching and Rash    ekg lead adhesive ? If left on to long Latex exam gloves  . Sulfa Antibiotics Nausea And Vomiting and Other (See Comments)  . Tape Rash    adhesive  . Metformin Diarrhea    High dose gives diarrhea High dose gives diarrhea  . Sulfasalazine Nausea Only and Nausea And Vomiting  . Zoster Vaccine Live Hives and Rash    Localized; Vaccine for shingles localized Localized; Vaccine for shingles     REVIEW OF SYSTEMS (Negative unless checked)  Constitutional: [] Weight loss  [] Fever  [] Chills Cardiac: [] Chest pain   [] Chest pressure   [] Palpitations   [] Shortness of breath when laying flat   [] Shortness of breath with exertion. Vascular:  [] Pain in legs with walking   [] Pain in legs at rest  [] History of DVT   [] Phlebitis   [] Swelling in legs   [] Varicose veins   [] Non-healing ulcers Pulmonary:   [] Uses home oxygen   [] Productive cough   [] Hemoptysis   [] Wheeze  [] COPD   [] Asthma Neurologic:  [x] Dizziness   [] Seizures   [] History of stroke   [x] History of TIA  [] Aphasia   [] Vissual changes   [] Weakness or numbness in arm   [] Weakness or numbness in leg Musculoskeletal:   [] Joint swelling   [] Joint pain   [] Low back pain Hematologic:  [] Easy bruising  [] Easy bleeding   [] Hypercoagulable state   [] Anemic Gastrointestinal:  [] Diarrhea   [] Vomiting  [] Gastroesophageal reflux/heartburn   [] Difficulty swallowing. Genitourinary:  [] Chronic kidney disease   [] Difficult urination  [] Frequent urination   [] Blood in urine Skin:  [] Rashes   [] Ulcers  Psychological:  [] History of anxiety   []  History of major depression.  Physical Examination  There were no vitals filed for this visit. There is no height or weight on file to calculate BMI. Gen: WD/WN, NAD Head: /AT, No temporalis wasting.  Ear/Nose/Throat: Hearing grossly intact, nares w/o erythema or drainage Eyes: PER, EOMI,  sclera nonicteric.  Neck: Supple, no large masses.   Pulmonary:  Good air movement, no audible wheezing bilaterally, no use of accessory muscles.  Cardiac: RRR, no JVD Vascular: left carotid bruit Vessel Right Left  Radial Palpable Palpable  Carotid Palpable Palpable  Gastrointestinal: Non-distended. No guarding/no peritoneal signs.  Musculoskeletal: M/S 5/5 throughout.  No deformity or atrophy.  Neurologic: CN 2-12 intact. Symmetrical.  Speech is fluent. Motor exam as listed above. Psychiatric: Judgment intact, Mood & affect appropriate for pt's clinical situation. Dermatologic: No rashes or ulcers noted.  No changes consistent with cellulitis.   CBC Lab Results  Component Value Date   WBC 7.6 07/19/2016   HGB 15.8 07/19/2016   HCT 48.2 07/19/2016   MCV 85.6 07/19/2016   PLT 199 07/19/2016  BMET    Component Value Date/Time   NA 137 07/19/2016 1142   NA 138 01/01/2014 1821   K 4.4 07/19/2016 1142   K 4.3 01/01/2014 1821   CL 101 07/19/2016 1142   CL 103 01/01/2014 1821   CO2 29 07/19/2016 1142   CO2 28 01/01/2014 1821   GLUCOSE 240 (H) 07/19/2016 1142   GLUCOSE 109 (H) 01/01/2014 1821   BUN 24 (H) 07/19/2016 1142   BUN 25 (H) 01/01/2014 1821   CREATININE 1.30 (H) 05/21/2020 1502   CREATININE 1.20 01/01/2014 1821   CALCIUM 9.8 07/19/2016 1142   CALCIUM 9.2 01/01/2014 1821   GFRNONAA 54 (L) 07/19/2016 1142   GFRNONAA >60 01/01/2014 1821   GFRAA >60 07/19/2016 1142   GFRAA >60 01/01/2014 1821   CrCl cannot be calculated (Unknown ideal weight.).  COAG No results found for: INR, PROTIME  Radiology CT ANGIO NECK W OR WO CONTRAST  Result Date: 05/22/2020 CLINICAL DATA:  Bilateral carotid artery stenosis. Carotid artery stenosis; stroke/TIA, assess extracranial arteries. EXAM: CT ANGIOGRAPHY NECK TECHNIQUE: Multidetector CT imaging of the neck was performed using the standard protocol during bolus administration of intravenous contrast. Multiplanar CT image  reconstructions and MIPs were obtained to evaluate the vascular anatomy. Carotid stenosis measurements (when applicable) are obtained utilizing NASCET criteria, using the distal internal carotid diameter as the denominator. CONTRAST:  58mL OMNIPAQUE IOHEXOL 350 MG/ML SOLN COMPARISON:  Carotid artery duplex 08/24/2019. FINDINGS: Aortic arch: The origin of the innominate and left common carotid arteries. Atherosclerotic plaque within the visualized aortic arch and proximal major branch vessels of the neck. No hemodynamically significant innominate or proximal subclavian artery stenosis. Right carotid system: CCA and ICA patent within the neck without measurable stenosis. Minimal calcified plaque within the proximal ICA. Calcified plaque within the intracranial right ICA with no more than mild stenosis at the imaged levels. Left carotid system: CCA and ICA patent within the neck. Soft and calcified plaque within the carotid bifurcation, proximal ICA and proximal ECA. Severe stenosis of the proximal ICA estimated at greater than 90% (series 6, image 69). Mild soft calcified plaque is also present more distally within the cervical ICA. Calcified plaque within the intracranial left ICA with no more than mild stenosis at the imaged levels Vertebral arteries: Vertebral arteries patent within the neck. Right vertebral artery slightly dominant. Severe atherosclerotic stenosis at the origin of the right vertebral artery. Moderate narrowing of the right vertebral artery at the C4 level due to mass effect from adjacent endplate osteophyte. Mild atherosclerotic narrowing at the origin of the left vertebral artery. Skeleton: Cervical spondylosis. No acute bony abnormality or aggressive osseous lesion. Other neck: No neck mass or cervical lymphadenopathy. Subcentimeter thyroid nodule within the right lobe, not meeting consensus criteria for ultrasound follow-up. Upper chest: No consolidation within the imaged lung apices.  IMPRESSION: Severe stenosis of the proximal left internal carotid artery (estimated at greater than 90%). No hemodynamically significant stenosis within the common carotid or cervical internal carotid arteries on the right. Minimal calcified plaque within the proximal right ICA. Severe atherosclerotic stenosis at the origin of the dominant right vertebral artery. Moderate narrowing of the right vertebral artery at the C4 level due to mass effect from and adjacent endplate osteophyte. Mild atherosclerotic narrowing at the origin of the non dominant left vertebral artery. Electronically Signed   By: Kellie Simmering DO   On: 05/22/2020 12:13      Assessment/Plan 1. Stenosis of left carotid artery Recommend:  The patient  is symptomatic with respect to the carotid stenosis.  The patient now has progressed and has a lesion that is >90%.  Patient's CT angiography of the carotid arteries confirms >90% left ICA stenosis.  The anatomical considerations support stenting over surgery.  He has a very high lesion at C2 and his neck is immobile because of DJD.  He also has significant cardiac and pulmonary comorbidities.  This was discussed in detail with the patient.  The risks, benefits and alternative therapies were reviewed in detail with the patient.  All questions were answered.  The patient agrees to proceed with stenting of the left carotid artery.  Continue antiplatelet therapy as prescribed. Continue management of CAD, HTN and Hyperlipidemia. Healthy heart diet, encouraged exercise at least 4 times per week.   2. Primary hypertension Continue antihypertensive medications as already ordered, these medications have been reviewed and there are no changes at this time.   3. Chronic venous insufficiency No surgery or intervention at this point in time.    I have reviewed my discussion with the patient regarding venous insufficiency and secondary lymph edema and why it  causes symptoms. I have discussed  with the patient the chronic skin changes that accompany these problems and the long term sequela such as ulceration and infection.  Patient will continue wearing graduated compression stockings class 1 (20-30 mmHg) on a daily basis a prescription was given to the patient to keep this updated. The patient will  put the stockings on first thing in the morning and removing them in the evening. The patient is instructed specifically not to sleep in the stockings.  In addition, behavioral modification including elevation during the day will be continued.  Diet and salt restriction was also discussed.  Previous duplex ultrasound of the lower extremities shows normal deep venous system, superficial reflux was not present.   Following the review of the ultrasound the patient will follow up in 12 months to reassess the degree of swelling and the control that graduated compression is offering.   The patient can be assessed for a Lymph Pump at that time.  However, at this time the patient states they are satisfied with the control compression and elevation is yielding.    4. Type 2 diabetes mellitus without complication, unspecified whether long term insulin use ([place]) Continue hypoglycemic medications as already ordered, these medications have been reviewed and there are no changes at this time.  Hgb A1C to be monitored as already arranged by primary service   5. Hypercholesteremia Continue statin as ordered and reviewed, no changes at this time    Hortencia Pilar, MD  05/23/2020 1:37 PM

## 2020-05-27 ENCOUNTER — Telehealth (INDEPENDENT_AMBULATORY_CARE_PROVIDER_SITE_OTHER): Payer: Self-pay

## 2020-05-27 NOTE — Telephone Encounter (Signed)
As Jared Ball is not on the patient's DPR the patient gave verbal authorization for me to speak with her regarding his healthcare. Patient was informed that on his next visit to please add Ms. Workman to his DPR. Patient is scheduled with Dr. Delana Meyer for a left carotid stent placement on 06/26/20 with a 9:45 am arrival time to the [facility]. Covid testing is on 06/24/20 between 8-12 pm at the [facility]

## 2020-06-26 ENCOUNTER — Encounter: Admission: RE | Payer: Self-pay | Source: Home / Self Care

## 2020-06-26 ENCOUNTER — Inpatient Hospital Stay: Admission: RE | Admit: 2020-06-26 | Payer: Medicare HMO | Source: Home / Self Care | Admitting: Vascular Surgery

## 2020-06-26 DIAGNOSIS — I6522 Occlusion and stenosis of left carotid artery: Secondary | ICD-10-CM

## 2020-06-26 SURGERY — CAROTID PTA/STENT INTERVENTION
Anesthesia: Moderate Sedation | Laterality: Left

## 2020-07-11 ENCOUNTER — Other Ambulatory Visit: Payer: Self-pay

## 2020-07-11 ENCOUNTER — Encounter: Payer: Self-pay | Admitting: Podiatry

## 2020-07-11 ENCOUNTER — Ambulatory Visit (INDEPENDENT_AMBULATORY_CARE_PROVIDER_SITE_OTHER): Payer: Medicare HMO | Admitting: Podiatry

## 2020-07-11 DIAGNOSIS — N1831 Chronic kidney disease, stage 3a: Secondary | ICD-10-CM

## 2020-07-11 DIAGNOSIS — B351 Tinea unguium: Secondary | ICD-10-CM

## 2020-07-11 DIAGNOSIS — D689 Coagulation defect, unspecified: Secondary | ICD-10-CM

## 2020-07-11 DIAGNOSIS — M79674 Pain in right toe(s): Secondary | ICD-10-CM

## 2020-07-11 DIAGNOSIS — I872 Venous insufficiency (chronic) (peripheral): Secondary | ICD-10-CM | POA: Diagnosis not present

## 2020-07-11 DIAGNOSIS — E1165 Type 2 diabetes mellitus with hyperglycemia: Secondary | ICD-10-CM

## 2020-07-11 DIAGNOSIS — M79675 Pain in left toe(s): Secondary | ICD-10-CM

## 2020-07-11 NOTE — Progress Notes (Signed)
This patient returns to my office for at risk foot care.  This patient requires this care by a professional since this patient will be at risk due to having diabetes.  Patient presents to the office with his daughter.  This patient is unable to cut nails himself since the patient cannot reach his nails.These nails are painful walking and wearing shoes.  This patient presents for at risk foot care today.  General Appearance  Alert, conversant and in no acute stress.  Vascular  Dorsalis pedis  pulses are palpable  B/L.Marland Kitchen  Posterior tibial pulses are absent due to swelling   B/L.  Marland Kitchen  Capillary return is within normal limits  bilaterally. Temperature is within normal limits  Bilaterally. Absent digital hair  B/L.,  Neurologic  Senn-Weinstein monofilament wire test within normal limits  bilaterally. Muscle power within normal limits bilaterally.  Nails Thick disfigured discolored nails with subungual debris  from hallux to fifth toes bilaterally. No evidence of bacterial infection or drainage bilaterally. Disfigured discolored    Orthopedic  No limitations of motion  feet .  No crepitus or effusions noted.  No bony pathology or digital deformities noted.  Skin  normotropic skin with no porokeratosis noted bilaterally.  No signs of infections or ulcers noted.  Skin necrosis distal tip second toe left foot.   Onychomycosis  Pain in right toes  Pain in left toes  Consent was obtained for treatment procedures.   Mechanical debridement of nails 1-5  bilaterally performed with a nail nipper.  Filed with dremel without incident.    Return office visit     3 months.                Told patient to return for periodic foot care and evaluation due to potential at risk complications.   Gardiner Barefoot DPM

## 2020-08-08 ENCOUNTER — Other Ambulatory Visit: Payer: Self-pay

## 2020-08-08 ENCOUNTER — Other Ambulatory Visit: Payer: Self-pay | Admitting: Internal Medicine

## 2020-08-08 ENCOUNTER — Ambulatory Visit
Admission: RE | Admit: 2020-08-08 | Discharge: 2020-08-08 | Disposition: A | Discharge status: Home / Self Care | Payer: Medicare HMO | Source: Ambulatory Visit | Attending: Internal Medicine | Admitting: Internal Medicine

## 2020-08-08 DIAGNOSIS — R600 Localized edema: Secondary | ICD-10-CM

## 2020-09-04 ENCOUNTER — Telehealth (INDEPENDENT_AMBULATORY_CARE_PROVIDER_SITE_OTHER): Payer: Self-pay | Admitting: Vascular Surgery

## 2020-09-04 NOTE — Telephone Encounter (Signed)
The patient can be scheduled with a reflux study however, it the patient has been having issues with swelling and not wearing compression stockings he would likely continue swell and have pain and issues despite finishing antibiotics.  I would suggest wearing compression socks and elevating lower extremities. Patient can see GS or me per pt pref

## 2020-09-04 NOTE — Telephone Encounter (Signed)
Patients left leg swollen and painful from the knee down . Patient had cellulitis and has finished his round of  antibiotics 2-3 weeks ago from PCP,  but the same leg has got back swollen again on Wednesday.  Patients care giver would like to see if she can set up and appointment to get his legs checked.  Please advise.

## 2020-09-05 NOTE — Telephone Encounter (Signed)
Ms Jared Ball was made aware with medical recommendations and was informed to be expecting a call from the office to schedule his appointment

## 2020-09-05 NOTE — Telephone Encounter (Signed)
Patient scheduled for 8/31 at 1:45 pm.  Offered caregiver a sooner appointment, but declined.

## 2020-09-10 ENCOUNTER — Other Ambulatory Visit (INDEPENDENT_AMBULATORY_CARE_PROVIDER_SITE_OTHER): Payer: Self-pay | Admitting: Nurse Practitioner

## 2020-09-10 DIAGNOSIS — R2242 Localized swelling, mass and lump, left lower limb: Secondary | ICD-10-CM

## 2020-09-11 ENCOUNTER — Ambulatory Visit (INDEPENDENT_AMBULATORY_CARE_PROVIDER_SITE_OTHER): Payer: Medicare HMO

## 2020-09-11 ENCOUNTER — Other Ambulatory Visit: Payer: Self-pay

## 2020-09-11 ENCOUNTER — Encounter (INDEPENDENT_AMBULATORY_CARE_PROVIDER_SITE_OTHER): Payer: Self-pay | Admitting: Nurse Practitioner

## 2020-09-11 ENCOUNTER — Ambulatory Visit (INDEPENDENT_AMBULATORY_CARE_PROVIDER_SITE_OTHER): Payer: Medicare HMO | Admitting: Nurse Practitioner

## 2020-09-11 VITALS — BP 124/67 | HR 78 | Resp 16 | Wt 216.0 lb

## 2020-09-11 DIAGNOSIS — I10 Essential (primary) hypertension: Secondary | ICD-10-CM | POA: Diagnosis not present

## 2020-09-11 DIAGNOSIS — I6522 Occlusion and stenosis of left carotid artery: Secondary | ICD-10-CM | POA: Diagnosis not present

## 2020-09-11 DIAGNOSIS — R2242 Localized swelling, mass and lump, left lower limb: Secondary | ICD-10-CM | POA: Diagnosis not present

## 2020-09-20 ENCOUNTER — Other Ambulatory Visit (INDEPENDENT_AMBULATORY_CARE_PROVIDER_SITE_OTHER): Payer: Self-pay | Admitting: Nurse Practitioner

## 2020-09-20 DIAGNOSIS — I6523 Occlusion and stenosis of bilateral carotid arteries: Secondary | ICD-10-CM

## 2020-09-22 ENCOUNTER — Encounter (INDEPENDENT_AMBULATORY_CARE_PROVIDER_SITE_OTHER): Payer: Self-pay | Admitting: Nurse Practitioner

## 2020-09-22 NOTE — Progress Notes (Signed)
Subjective:    Patient ID: Jared Ball, male    DOB: Mar 19, 1932, 87 y.o.   MRN: [id number] Chief Complaint  Patient presents with   Follow-up    Add on per phone note    Jared Ball is an 87 year old male that presents today for follow-up evaluation regarding his lower extremity edema.  He recently had cellulitis that required 2 rounds of antibiotics for treatment.  He typically has worsening issues with swelling in his left leg.  He has developed a sort of atrophy in his left lower extremity.  This atrophy increases the risk for swelling.  Today he has no evidence of DVT or superficial thrombophlebitis.  Patient will also schedule have a stent placed in his carotid artery however he was unable to move forward due to his wife's illness.  His wife has passed away but he would like to move forward with treatment at this time.   Review of Systems  Cardiovascular:  Positive for leg swelling.  All other systems reviewed and are negative.     Objective:   Physical Exam Vitals reviewed.  HENT:     Head: Normocephalic.  Cardiovascular:     Rate and Rhythm: Normal rate.     Pulses: Normal pulses.  Pulmonary:     Effort: Pulmonary effort is normal.  Neurological:     Mental Status: He is alert and oriented to person, place, and time.     Motor: Weakness present.  Psychiatric:        Mood and Affect: Mood normal.        Behavior: Behavior normal.        Thought Content: Thought content normal.        Judgment: Judgment normal.    BP 124/67 (BP Location: Right Arm)   Pulse 78   Resp 16   Wt 216 lb (98 kg)   BMI 30.99 kg/m   Past Medical History:  Diagnosis Date   Anxiety    Arthritis    Atrial fibrillation (HCC)    BPH (benign prostatic hyperplasia)    Bronchitis    Chronic kidney disease    Cognitive changes    Diabetes mellitus without complication (HCC)    Type 2   Dysrhythmia    GERD (gastroesophageal reflux disease)    Headache    History of kidney stones    hx  of   HOH (hard of hearing)    Hypercholesteremia    Hypertension    IBS (irritable bowel syndrome)    PONV (postoperative nausea and vomiting)    Shingles 15 yrs ago   HX of   Shortness of breath dyspnea    heart related   Sleep apnea    no CPAP   Spinal stenosis    Vertigo    Vitamin D deficiency     Social History   Socioeconomic History   Marital status: Married    Spouse name: Not on file   Number of children: Not on file   Years of education: Not on file   Highest education level: Not on file  Occupational History   Not on file  Tobacco Use   Smoking status: Former    Types: Cigarettes    Quit date: 1990    Years since quitting: 32.7   Smokeless tobacco: Never  Substance and Sexual Activity   Alcohol use: Yes    Alcohol/week: 0.0 standard drinks    Comment: red wine occasionally   Drug use: No  Sexual activity: Not on file  Other Topics Concern   Not on file  Social History Narrative   Not on file   Social Determinants of Health   Financial Resource Strain: Not on file  Food Insecurity: Not on file  Transportation Needs: Not on file  Physical Activity: Not on file  Stress: Not on file  Social Connections: Not on file  Intimate Partner Violence: Not on file    Past Surgical History:  Procedure Laterality Date   CATARACT EXTRACTION W/PHACO Left 05/17/2019   Procedure: CATARACT EXTRACTION PHACO AND INTRAOCULAR LENS PLACEMENT ([place]) LEFT DIABETIC MALYUGIN;  Surgeon: Leandrew Koyanagi, MD;  Location: [place];  Service: Ophthalmology;  Laterality: Left;  16.54 1:31.1 18.2%   CATARACT EXTRACTION W/PHACO Right 07/12/2019   Procedure: CATARACT EXTRACTION PHACO AND INTRAOCULAR LENS PLACEMENT ([facility]) RIGHT MALYUGIN DIABETIC 14.90 01:37.8 15.3%;  Surgeon: Leandrew Koyanagi, MD;  Location: [place];  Service: Ophthalmology;  Laterality: Right;  Diabetic - oral meds Latex   COLONOSCOPY     CYSTOSCOPY W/ RETROGRADES Bilateral 07/02/2014    Procedure: CYSTOSCOPY WITH RETROGRADE PYELOGRAM;  Surgeon: Hollice Espy, MD;  Location: [facility];  Service: Urology;  Laterality: Bilateral;   CYSTOSCOPY WITH STENT PLACEMENT Left 07/02/2014   Procedure: CYSTOSCOPY WITH STENT PLACEMENT;  Surgeon: Hollice Espy, MD;  Location: [facility];  Service: Urology;  Laterality: Left;   DENTAL SURGERY     in the [place] CATH AND CORONARY ANGIOGRAPHY Left 11/09/2019   Procedure: LEFT HEART CATH AND CORONARY ANGIOGRAPHY with Radial Approach;  Surgeon: Yolonda Kida, MD;  Location: [place] [facility];  Service: Cardiovascular;  Laterality: Left;   TONSILLECTOMY     and adenoids   URETEROSCOPY WITH HOLMIUM LASER LITHOTRIPSY Left 07/02/2014   Procedure: URETEROSCOPY WITH HOLMIUM LASER LITHOTRIPSY;  Surgeon: Hollice Espy, MD;  Location: [facility];  Service: Urology;  Laterality: Left;    Family History  Problem Relation Age of Onset   Kidney disease Mother    Prostate cancer Neg Hx    Kidney cancer Neg Hx    Bladder Cancer Neg Hx     Allergies  Allergen Reactions   Sulfa Antibiotics Nausea And Vomiting and Other (See Comments)   Tape Rash    adhesive   Metformin Diarrhea    High dose gives diarrhea    Sulfasalazine Nausea Only and Nausea And Vomiting   Zoster Vaccine Live Hives and Rash    Localized; Vaccine for shingles     CBC Latest Ref Rng & Units 07/19/2016 06/27/2014 01/01/2014  WBC 3.8 - 10.6 K/uL 7.6 9.0 9.5  Hemoglobin 13.0 - 18.0 g/dL 15.8 15.6 15.8  Hematocrit 40.0 - 52.0 % 48.2 48.0 49.5  Platelets 150 - 440 K/uL 199 180 199      CMP     Component Value Date/Time   NA 137 07/19/2016 1142   NA 138 01/01/2014 1821   K 4.4 07/19/2016 1142   K 4.3 01/01/2014 1821   CL 101 07/19/2016 1142   CL 103 01/01/2014 1821   CO2 29 07/19/2016 1142   CO2 28 01/01/2014 1821   GLUCOSE 240 (H) 07/19/2016 1142   GLUCOSE 109 (H) 01/01/2014 1821   BUN 24 (H) 07/19/2016 1142   BUN 25 (H) 01/01/2014 1821   CREATININE  1.30 (H) 05/21/2020 1502   CREATININE 1.20 01/01/2014 1821   CALCIUM 9.8 07/19/2016 1142   CALCIUM 9.2 01/01/2014 1821   GFRNONAA 54 (L) 07/19/2016 1142  GFRNONAA >60 01/01/2014 1821   GFRAA >60 07/19/2016 1142   GFRAA >60 01/01/2014 1821     No results found.     Assessment & Plan:   1. Localized swelling of left lower leg I have had a long discussion with the patient regarding swelling and why it  causes symptoms.  Patient will begin wearing graduated compression stockings class 1 (20-30 mmHg) on a daily basis a prescription was given. The patient will  beginning wearing the stockings first thing in the morning and removing them in the evening. The patient is instructed specifically not to sleep in the stockings.   In addition, behavioral modification will be initiated.  This will include frequent elevation, use of over the counter pain medications and exercise such as walking.  I have reviewed systemic causes for chronic edema such as liver, kidney and cardiac etiologies.  The patient denies problems with these organ systems.    Consideration for a lymph pump will also be made based upon the effectiveness of conservative therapy.  This would help to improve the edema control and prevent sequela such as ulcers and infections   We will evaluate the patient's progress with conservative therapy at his next follow-up visit.  2. Primary hypertension Continue antihypertensive medications as already ordered, these medications have been reviewed and there are no changes at this time.   3. Stenosis of left carotid artery The patient was originally scheduled to have a stent placed however his wife was ill at the time he was able to move forward.  The patient's wife has passed away and this leaves him.  To pursue intervention on his carotid artery.  Because it has been sometime, we will obtain a carotid artery duplex to ensure that the patient has not occluded his carotid artery.   Current  Outpatient Medications on File Prior to Visit  Medication Sig Dispense Refill   acetaminophen (TYLENOL) 500 MG tablet Take 500 mg by mouth every 8 (eight) hours as needed for mild pain or moderate pain.      amiodarone (PACERONE) 100 MG tablet Take 100 mg by mouth daily.     aspirin EC 81 MG tablet Take 81 mg by mouth daily.      BLACK ELDERBERRY PO Take 150 mg by mouth daily.     calcium elemental as carbonate (TUMS ULTRA 1000) 400 MG chewable tablet Chew 1,000 mg by mouth daily as needed for heartburn.     carboxymethylcellulose (REFRESH PLUS) 0.5 % SOLN Place 1 drop into both eyes 3 (three) times daily as needed (dry eye).     Cholecalciferol 25 MCG (1000 UT) tablet Take 1,000 Units by mouth daily.      clopidogrel (PLAVIX) 75 MG tablet Take 75 mg by mouth daily.     dextromethorphan (DELSYM) 30 MG/5ML liquid Take 15 mg by mouth 2 (two) times daily as needed for cough.     finasteride (PROSCAR) 5 MG tablet Take 1 tablet (5 mg total) by mouth daily. 90 tablet 3   fluticasone (FLONASE) 50 MCG/ACT nasal spray Place 1 spray into the nose 2 (two) times daily as needed for allergies.     glucose blood test strip      isosorbide mononitrate (IMDUR) 120 MG 24 hr tablet Take 120 mg by mouth daily.     loperamide (IMODIUM A-D) 2 MG tablet Take 2 mg by mouth 3 (three) times daily as needed for diarrhea or loose stools.      lovastatin (MEVACOR) 40  MG tablet Take 40 mg by mouth at bedtime.      metFORMIN (GLUCOPHAGE) 500 MG tablet Take 500 mg by mouth 3 (three) times daily.      metoprolol tartrate (LOPRESSOR) 25 MG tablet Take 25 mg by mouth daily as needed (High blood pressure).      Multiple Vitamins-Minerals (MULTIVITAMIN WITH MINERALS) tablet Take 1 tablet by mouth daily.     pantoprazole (PROTONIX) 40 MG tablet Take 40 mg by mouth daily.      ranolazine (RANEXA) 500 MG 12 hr tablet Take 500 mg by mouth 2 (two) times daily.      tamsulosin (FLOMAX) 0.4 MG CAPS capsule Take 1 capsule (0.4 mg total)  by mouth daily. 90 capsule 3   torsemide (DEMADEX) 5 MG tablet Take 5 mg by mouth daily.      vitamin B-12 (CYANOCOBALAMIN) 250 MCG tablet Take 500 mcg by mouth daily. Gummies     No current facility-administered medications on file prior to visit.    There are no Patient Instructions on file for this visit. No follow-ups on file.   Kris Hartmann, NP

## 2020-09-23 ENCOUNTER — Ambulatory Visit (INDEPENDENT_AMBULATORY_CARE_PROVIDER_SITE_OTHER): Payer: Medicare HMO | Admitting: Vascular Surgery

## 2020-09-23 ENCOUNTER — Other Ambulatory Visit: Payer: Self-pay

## 2020-09-23 ENCOUNTER — Ambulatory Visit (INDEPENDENT_AMBULATORY_CARE_PROVIDER_SITE_OTHER): Payer: Medicare HMO

## 2020-09-23 DIAGNOSIS — I6523 Occlusion and stenosis of bilateral carotid arteries: Secondary | ICD-10-CM

## 2020-09-23 DIAGNOSIS — I872 Venous insufficiency (chronic) (peripheral): Secondary | ICD-10-CM

## 2020-09-23 DIAGNOSIS — E119 Type 2 diabetes mellitus without complications: Secondary | ICD-10-CM

## 2020-09-23 DIAGNOSIS — I6522 Occlusion and stenosis of left carotid artery: Secondary | ICD-10-CM

## 2020-09-23 DIAGNOSIS — E7800 Pure hypercholesterolemia, unspecified: Secondary | ICD-10-CM

## 2020-09-23 DIAGNOSIS — I10 Essential (primary) hypertension: Secondary | ICD-10-CM

## 2020-09-23 DIAGNOSIS — I482 Chronic atrial fibrillation, unspecified: Secondary | ICD-10-CM

## 2020-09-23 NOTE — Progress Notes (Signed)
MRN : [id number]  Jared Ball is a 87 y.o. (30-Mar-1932) male who presents with chief complaint of follow up carotid.  History of Present Illness:   The patient is seen for follow up evaluation of carotid stenosis status post CT angiogram. CT scan was done 05/21/2020. Patient reports that the test went well with no problems or complications.    The patient denies interval amaurosis fugax. There is no recent or interval TIA symptoms or focal motor deficits. There is no prior documented CVA.   The patient is taking enteric-coated aspirin 81 mg daily.   There is no history of migraine headaches. There is no history of seizures.   The patient has a history of coronary artery disease, no recent episodes of angina or shortness of breath. The patient denies PAD or claudication symptoms. There is a history of hyperlipidemia which is being treated with a statin.     CT angiogram is reviewed by me personally and shows >90% stenosis consistent with calcified plaque at the origin of the left internal carotid artery.   No outpatient medications have been marked as taking for the 09/23/20 encounter (Appointment) with Delana Meyer, Dolores Lory, MD.    Past Medical History:  Diagnosis Date   Anxiety    Arthritis    Atrial fibrillation ([place])    BPH (benign prostatic hyperplasia)    Bronchitis    Chronic kidney disease    Cognitive changes    Diabetes mellitus without complication ([place])    Type 2   Dysrhythmia    GERD (gastroesophageal reflux disease)    Headache    History of kidney stones    hx of   HOH (hard of hearing)    Hypercholesteremia    Hypertension    IBS (irritable bowel syndrome)    PONV (postoperative nausea and vomiting)    Shingles 15 yrs ago   HX of   Shortness of breath dyspnea    heart related   Sleep apnea    no CPAP   Spinal stenosis    Vertigo    Vitamin D deficiency     Past Surgical History:  Procedure Laterality Date   CATARACT EXTRACTION W/PHACO Left  05/17/2019   Procedure: CATARACT EXTRACTION PHACO AND INTRAOCULAR LENS PLACEMENT ([place]) LEFT DIABETIC MALYUGIN;  Surgeon: Leandrew Koyanagi, MD;  Location: [place];  Service: Ophthalmology;  Laterality: Left;  16.54 1:31.1 18.2%   CATARACT EXTRACTION W/PHACO Right 07/12/2019   Procedure: CATARACT EXTRACTION PHACO AND INTRAOCULAR LENS PLACEMENT ([facility]) RIGHT MALYUGIN DIABETIC 14.90 01:37.8 15.3%;  Surgeon: Leandrew Koyanagi, MD;  Location: [place];  Service: Ophthalmology;  Laterality: Right;  Diabetic - oral meds Latex   COLONOSCOPY     CYSTOSCOPY W/ RETROGRADES Bilateral 07/02/2014   Procedure: CYSTOSCOPY WITH RETROGRADE PYELOGRAM;  Surgeon: Hollice Espy, MD;  Location: [facility];  Service: Urology;  Laterality: Bilateral;   CYSTOSCOPY WITH STENT PLACEMENT Left 07/02/2014   Procedure: CYSTOSCOPY WITH STENT PLACEMENT;  Surgeon: Hollice Espy, MD;  Location: [facility];  Service: Urology;  Laterality: Left;   DENTAL SURGERY     in the [place] CATH AND CORONARY ANGIOGRAPHY Left 11/09/2019   Procedure: LEFT HEART CATH AND CORONARY ANGIOGRAPHY with Radial Approach;  Surgeon: Yolonda Kida, MD;  Location: [place] [facility];  Service: Cardiovascular;  Laterality: Left;   TONSILLECTOMY     and adenoids   URETEROSCOPY WITH HOLMIUM LASER LITHOTRIPSY Left 07/02/2014   Procedure: URETEROSCOPY WITH HOLMIUM LASER LITHOTRIPSY;  Surgeon: Hollice Espy, MD;  Location: [facility];  Service: Urology;  Laterality: Left;    Social History Social History   Tobacco Use   Smoking status: Former    Types: Cigarettes    Quit date: 1990    Years since quitting: 32.7   Smokeless tobacco: Never  Substance Use Topics   Alcohol use: Yes    Alcohol/week: 0.0 standard drinks    Comment: red wine occasionally   Drug use: No    Family History Family History  Problem Relation Age of Onset   Kidney disease Mother    Prostate cancer Neg Hx    Kidney cancer Neg Hx     Bladder Cancer Neg Hx     Allergies  Allergen Reactions   Sulfa Antibiotics Nausea And Vomiting and Other (See Comments)   Tape Rash    adhesive   Metformin Diarrhea    High dose gives diarrhea    Sulfasalazine Nausea Only and Nausea And Vomiting   Zoster Vaccine Live Hives and Rash    Localized; Vaccine for shingles      REVIEW OF SYSTEMS (Negative unless checked)  Constitutional: '[]'$ Weight loss  '[]'$ Fever  '[]'$ Chills Cardiac: '[]'$ Chest pain   '[]'$ Chest pressure   '[]'$ Palpitations   '[]'$ Shortness of breath when laying flat   '[]'$ Shortness of breath with exertion. Vascular:  '[]'$ Pain in legs with walking   '[]'$ Pain in legs at rest  '[]'$ History of DVT   '[]'$ Phlebitis   '[]'$ Swelling in legs   '[]'$ Varicose veins   '[]'$ Non-healing ulcers Pulmonary:   '[]'$ Uses home oxygen   '[]'$ Productive cough   '[]'$ Hemoptysis   '[]'$ Wheeze  '[]'$ COPD   '[]'$ Asthma Neurologic:  '[]'$ Dizziness   '[]'$ Seizures   '[]'$ History of stroke   '[]'$ History of TIA  '[]'$ Aphasia   '[]'$ Vissual changes   '[]'$ Weakness or numbness in arm   '[]'$ Weakness or numbness in leg Musculoskeletal:   '[]'$ Joint swelling   '[]'$ Joint pain   '[]'$ Low back pain Hematologic:  '[]'$ Easy bruising  '[]'$ Easy bleeding   '[]'$ Hypercoagulable state   '[]'$ Anemic Gastrointestinal:  '[]'$ Diarrhea   '[]'$ Vomiting  '[]'$ Gastroesophageal reflux/heartburn   '[]'$ Difficulty swallowing. Genitourinary:  '[]'$ Chronic kidney disease   '[]'$ Difficult urination  '[]'$ Frequent urination   '[]'$ Blood in urine Skin:  '[]'$ Rashes   '[]'$ Ulcers  Psychological:  '[]'$ History of anxiety   '[]'$  History of major depression.  Physical Examination  There were no vitals filed for this visit. There is no height or weight on file to calculate BMI. Gen: WD/WN, NAD Head: [place]/AT, No temporalis wasting.  Ear/Nose/Throat: Hearing grossly intact, nares w/o erythema or drainage Eyes: PER, EOMI, sclera nonicteric.  Neck: Supple, no masses.  No bruit or JVD.  Pulmonary:  Good air movement, no audible wheezing, no use of accessory muscles.  Cardiac: RRR, normal S1, S2, no  Murmurs. Vascular:   Vessel Right Left  Radial Palpable Palpable  Carotid Palpable Palpable  PT Palpable Palpable  DP Palpable Palpable  Gastrointestinal: soft, non-distended. No guarding/no peritoneal signs.  Musculoskeletal: M/S 5/5 throughout.  No visible deformity.  Neurologic: CN 2-12 intact. Pain and light touch intact in extremities.  Symmetrical.  Speech is fluent. Motor exam as listed above. Psychiatric: Judgment intact, Mood & affect appropriate for pt's clinical situation. Dermatologic: No rashes or ulcers noted.  No changes consistent with cellulitis.   CBC Lab Results  Component Value Date   WBC 7.6 07/19/2016   HGB 15.8 07/19/2016   HCT 48.2 07/19/2016   MCV 85.6 07/19/2016   PLT 199 07/19/2016    BMET  Component Value Date/Time   NA 137 07/19/2016 1142   NA 138 01/01/2014 1821   K 4.4 07/19/2016 1142   K 4.3 01/01/2014 1821   CL 101 07/19/2016 1142   CL 103 01/01/2014 1821   CO2 29 07/19/2016 1142   CO2 28 01/01/2014 1821   GLUCOSE 240 (H) 07/19/2016 1142   GLUCOSE 109 (H) 01/01/2014 1821   BUN 24 (H) 07/19/2016 1142   BUN 25 (H) 01/01/2014 1821   CREATININE 1.30 (H) 05/21/2020 1502   CREATININE 1.20 01/01/2014 1821   CALCIUM 9.8 07/19/2016 1142   CALCIUM 9.2 01/01/2014 1821   GFRNONAA 54 (L) 07/19/2016 1142   GFRNONAA >60 01/01/2014 1821   GFRAA >60 07/19/2016 1142   GFRAA >60 01/01/2014 1821   CrCl cannot be calculated (Patient's most recent lab result is older than the maximum 21 days allowed.).  COAG No results found for: INR, PROTIME  Radiology VAS [place] LOWER EXTREMITY VENOUS REFLUX  Result Date: 09/19/2020  Lower Venous Reflux Study Patient Name:  Jared Ball  Date of Exam:   09/11/2020 Medical Rec #: [id number]    Accession #:    [id number] Date of Birth: 23-Jul-1932    Patient Gender: M Patient Age:   41 years Exam Location:  [facility] Procedure:      VAS [place] LOWER EXTREMITY VENOUS REFLUX Referring Phys: Eulogio Ditch  --------------------------------------------------------------------------------  Indications: Pain, and Swelling.  Performing Technologist: Charlane Ferretti RT (R)(VS)  Examination Guidelines: A complete evaluation includes B-mode imaging, spectral Doppler, color Doppler, and power Doppler as needed of all accessible portions of each vessel. Bilateral testing is considered an integral part of a complete examination. Limited examinations for reoccurring indications may be performed as noted. The reflux portion of the exam is performed with the patient in reverse Trendelenburg. Significant venous reflux is defined as >500 ms in the superficial venous system, and >1 second in the deep venous system.  +--------------+---------+----------+-----------+------------+---------------+ LEFT          Reflux NoReflux YesReflux TimeDiameter cmsComments        +--------------+---------+----------+-----------+------------+---------------+ CFV           no                                                        +--------------+---------+----------+-----------+------------+---------------+ FV mid        no                                                        +--------------+---------+----------+-----------+------------+---------------+ Popliteal     no                                                        +--------------+---------+----------+-----------+------------+---------------+ GSV at SFJ    no                                                        +--------------+---------+----------+-----------+------------+---------------+  GSV prox thighno                                                        +--------------+---------+----------+-----------+------------+---------------+ GSV mid thigh no                                                        +--------------+---------+----------+-----------+------------+---------------+ GSV dist thighno                                                         +--------------+---------+----------+-----------+------------+---------------+ GSV at knee   no                                                        +--------------+---------+----------+-----------+------------+---------------+ SSV Pop Fossa no                                        Wall thickening +--------------+---------+----------+-----------+------------+---------------+ Summary: Left: - No evidence of deep vein thrombosis seen in the left lower extremity, from the common femoral through the popliteal veins. - No evidence of superficial venous thrombosis in the left lower extremity. - There is no evidence of venous reflux seen in the left lower extremity.  *See table(s) above for measurements and observations. Electronically signed by Hortencia Pilar MD on 09/19/2020 at 5:14:46 PM.    Final      Assessment/Plan There are no diagnoses linked to this encounter.   Hortencia Pilar, MD  09/23/2020 1:43 PM

## 2020-09-25 ENCOUNTER — Encounter (INDEPENDENT_AMBULATORY_CARE_PROVIDER_SITE_OTHER): Payer: Self-pay | Admitting: *Deleted

## 2020-09-26 ENCOUNTER — Ambulatory Visit (INDEPENDENT_AMBULATORY_CARE_PROVIDER_SITE_OTHER): Payer: Medicare HMO | Admitting: Vascular Surgery

## 2020-09-26 ENCOUNTER — Other Ambulatory Visit: Payer: Self-pay

## 2020-09-26 VITALS — BP 134/71 | HR 80 | Ht 70.0 in | Wt 214.0 lb

## 2020-09-26 DIAGNOSIS — E1165 Type 2 diabetes mellitus with hyperglycemia: Secondary | ICD-10-CM

## 2020-09-26 DIAGNOSIS — I482 Chronic atrial fibrillation, unspecified: Secondary | ICD-10-CM

## 2020-09-26 DIAGNOSIS — I10 Essential (primary) hypertension: Secondary | ICD-10-CM

## 2020-09-26 DIAGNOSIS — I890 Lymphedema, not elsewhere classified: Secondary | ICD-10-CM

## 2020-09-26 DIAGNOSIS — I6529 Occlusion and stenosis of unspecified carotid artery: Secondary | ICD-10-CM | POA: Diagnosis not present

## 2020-09-26 NOTE — Progress Notes (Signed)
MRN : [id number]  Jared Ball is a 87 y.o. (Oct 12, 1932) male who presents with chief complaint of check my carotid.  History of Present Illness:   The patient is seen for follow up evaluation of carotid stenosis status post CT angiogram. CT scan was done 05/21/2020. Patient reports that the test went well with no problems or complications.    The patient denies interval amaurosis fugax. There is no recent or interval TIA symptoms or focal motor deficits. There is no prior documented CVA.   The patient is taking enteric-coated aspirin 81 mg daily.   There is no history of migraine headaches. There is no history of seizures.   The patient has a history of coronary artery disease, no recent episodes of angina or shortness of breath. The patient denies PAD or claudication symptoms. There is a history of hyperlipidemia which is being treated with a statin.     CT angiogram is reviewed by me personally and shows >90% stenosis consistent with calcified plaque at the origin of the left internal carotid artery.   No outpatient medications have been marked as taking for the 09/26/20 encounter (Appointment) with Delana Meyer, Dolores Lory, MD.    Past Medical History:  Diagnosis Date   Anxiety    Arthritis    Atrial fibrillation ([place])    BPH (benign prostatic hyperplasia)    Bronchitis    Chronic kidney disease    Cognitive changes    Diabetes mellitus without complication ([place])    Type 2   Dysrhythmia    GERD (gastroesophageal reflux disease)    Headache    History of kidney stones    hx of   HOH (hard of hearing)    Hypercholesteremia    Hypertension    IBS (irritable bowel syndrome)    PONV (postoperative nausea and vomiting)    Shingles 15 yrs ago   HX of   Shortness of breath dyspnea    heart related   Sleep apnea    no CPAP   Spinal stenosis    Vertigo    Vitamin D deficiency     Past Surgical History:  Procedure Laterality Date   CATARACT EXTRACTION W/PHACO Left  05/17/2019   Procedure: CATARACT EXTRACTION PHACO AND INTRAOCULAR LENS PLACEMENT ([place]) LEFT DIABETIC MALYUGIN;  Surgeon: Leandrew Koyanagi, MD;  Location: [place];  Service: Ophthalmology;  Laterality: Left;  16.54 1:31.1 18.2%   CATARACT EXTRACTION W/PHACO Right 07/12/2019   Procedure: CATARACT EXTRACTION PHACO AND INTRAOCULAR LENS PLACEMENT ([facility]) RIGHT MALYUGIN DIABETIC 14.90 01:37.8 15.3%;  Surgeon: Leandrew Koyanagi, MD;  Location: [place];  Service: Ophthalmology;  Laterality: Right;  Diabetic - oral meds Latex   COLONOSCOPY     CYSTOSCOPY W/ RETROGRADES Bilateral 07/02/2014   Procedure: CYSTOSCOPY WITH RETROGRADE PYELOGRAM;  Surgeon: Hollice Espy, MD;  Location: [facility];  Service: Urology;  Laterality: Bilateral;   CYSTOSCOPY WITH STENT PLACEMENT Left 07/02/2014   Procedure: CYSTOSCOPY WITH STENT PLACEMENT;  Surgeon: Hollice Espy, MD;  Location: [facility];  Service: Urology;  Laterality: Left;   DENTAL SURGERY     in the [place] CATH AND CORONARY ANGIOGRAPHY Left 11/09/2019   Procedure: LEFT HEART CATH AND CORONARY ANGIOGRAPHY with Radial Approach;  Surgeon: Yolonda Kida, MD;  Location: [facility];  Service: Cardiovascular;  Laterality: Left;   TONSILLECTOMY     and adenoids   URETEROSCOPY WITH HOLMIUM LASER LITHOTRIPSY Left 07/02/2014   Procedure: URETEROSCOPY WITH HOLMIUM LASER LITHOTRIPSY;  Surgeon: Hollice Espy, MD;  Location: [facility];  Service: Urology;  Laterality: Left;    Social History Social History   Tobacco Use   Smoking status: Former    Types: Cigarettes    Quit date: 1990    Years since quitting: 32.7   Smokeless tobacco: Never  Substance Use Topics   Alcohol use: Yes    Alcohol/week: 0.0 standard drinks    Comment: red wine occasionally   Drug use: No    Family History Family History  Problem Relation Age of Onset   Kidney disease Mother    Prostate cancer Neg Hx    Kidney cancer Neg Hx     Bladder Cancer Neg Hx     Allergies  Allergen Reactions   Sulfa Antibiotics Nausea And Vomiting and Other (See Comments)   Tape Rash    adhesive   Metformin Diarrhea    High dose gives diarrhea    Sulfasalazine Nausea Only and Nausea And Vomiting   Zoster Vaccine Live Hives and Rash    Localized; Vaccine for shingles      REVIEW OF SYSTEMS (Negative unless checked)  Constitutional: '[]'$ Weight loss  '[]'$ Fever  '[]'$ Chills Cardiac: '[]'$ Chest pain   '[]'$ Chest pressure   '[]'$ Palpitations   '[]'$ Shortness of breath when laying flat   '[]'$ Shortness of breath with exertion. Vascular:  '[]'$ Pain in legs with walking   '[]'$ Pain in legs at rest  '[]'$ History of DVT   '[]'$ Phlebitis   '[]'$ Swelling in legs   '[]'$ Varicose veins   '[]'$ Non-healing ulcers Pulmonary:   '[]'$ Uses home oxygen   '[]'$ Productive cough   '[]'$ Hemoptysis   '[]'$ Wheeze  '[]'$ COPD   '[]'$ Asthma Neurologic:  '[]'$ Dizziness   '[]'$ Seizures   '[]'$ History of stroke   '[x]'$ History of TIA  '[]'$ Aphasia   '[]'$ Vissual changes   '[]'$ Weakness or numbness in arm   '[]'$ Weakness or numbness in leg Musculoskeletal:   '[]'$ Joint swelling   '[]'$ Joint pain   '[]'$ Low back pain Hematologic:  '[]'$ Easy bruising  '[]'$ Easy bleeding   '[]'$ Hypercoagulable state   '[]'$ Anemic Gastrointestinal:  '[]'$ Diarrhea   '[]'$ Vomiting  '[]'$ Gastroesophageal reflux/heartburn   '[]'$ Difficulty swallowing. Genitourinary:  '[]'$ Chronic kidney disease   '[]'$ Difficult urination  '[]'$ Frequent urination   '[]'$ Blood in urine Skin:  '[]'$ Rashes   '[]'$ Ulcers  Psychological:  '[]'$ History of anxiety   '[]'$  History of major depression.  Physical Examination  There were no vitals filed for this visit. There is no height or weight on file to calculate BMI. Gen: WD/WN, NAD Head: [place]/AT, No temporalis wasting.  Ear/Nose/Throat: Hearing grossly intact, nares w/o erythema or drainage Eyes: PER, EOMI, sclera nonicteric.  Neck: Supple, no masses.  No bruit or JVD.  Pulmonary:  Good air movement, no audible wheezing, no use of accessory muscles.  Cardiac: RRR, normal S1, S2, no  Murmurs. Vascular:   left carotid bruit Vessel Right Left  Radial Palpable Palpable  Carotid Palpable Palpable  Gastrointestinal: soft, non-distended. No guarding/no peritoneal signs.  Musculoskeletal: M/S 5/5 throughout.  No visible deformity.  Neurologic: CN 2-12 intact. Pain and light touch intact in extremities.  Symmetrical.  Speech is fluent. Motor exam as listed above. Psychiatric: Judgment intact, Mood & affect appropriate for pt's clinical situation. Dermatologic: No rashes or ulcers noted.  No changes consistent with cellulitis.   CBC Lab Results  Component Value Date   WBC 7.6 07/19/2016   HGB 15.8 07/19/2016   HCT 48.2 07/19/2016   MCV 85.6 07/19/2016   PLT 199 07/19/2016    BMET    Component Value Date/Time  NA 137 07/19/2016 1142   NA 138 01/01/2014 1821   K 4.4 07/19/2016 1142   K 4.3 01/01/2014 1821   CL 101 07/19/2016 1142   CL 103 01/01/2014 1821   CO2 29 07/19/2016 1142   CO2 28 01/01/2014 1821   GLUCOSE 240 (H) 07/19/2016 1142   GLUCOSE 109 (H) 01/01/2014 1821   BUN 24 (H) 07/19/2016 1142   BUN 25 (H) 01/01/2014 1821   CREATININE 1.30 (H) 05/21/2020 1502   CREATININE 1.20 01/01/2014 1821   CALCIUM 9.8 07/19/2016 1142   CALCIUM 9.2 01/01/2014 1821   GFRNONAA 54 (L) 07/19/2016 1142   GFRNONAA >60 01/01/2014 1821   GFRAA >60 07/19/2016 1142   GFRAA >60 01/01/2014 1821   CrCl cannot be calculated (Patient's most recent lab result is older than the maximum 21 days allowed.).  COAG No results found for: INR, PROTIME  Radiology VAS [place] LOWER EXTREMITY VENOUS REFLUX  Result Date: 09/19/2020  Lower Venous Reflux Study Patient Name:  CLEVIE DO  Date of Exam:   09/11/2020 Medical Rec #: [id number]    Accession #:    [id number] Date of Birth: [birth year]    Patient Gender: M Patient Age:   64 years Exam Location:  [facility] Procedure:      VAS [place] LOWER EXTREMITY VENOUS REFLUX Referring Phys: Eulogio Ditch  --------------------------------------------------------------------------------  Indications: Pain, and Swelling.  Performing Technologist: Charlane Ferretti RT (R)(VS)  Examination Guidelines: A complete evaluation includes B-mode imaging, spectral Doppler, color Doppler, and power Doppler as needed of all accessible portions of each vessel. Bilateral testing is considered an integral part of a complete examination. Limited examinations for reoccurring indications may be performed as noted. The reflux portion of the exam is performed with the patient in reverse Trendelenburg. Significant venous reflux is defined as >500 ms in the superficial venous system, and >1 second in the deep venous system.  +--------------+---------+----------+-----------+------------+---------------+ LEFT          Reflux NoReflux YesReflux TimeDiameter cmsComments        +--------------+---------+----------+-----------+------------+---------------+ CFV           no                                                        +--------------+---------+----------+-----------+------------+---------------+ FV mid        no                                                        +--------------+---------+----------+-----------+------------+---------------+ Popliteal     no                                                        +--------------+---------+----------+-----------+------------+---------------+ GSV at SFJ    no                                                        +--------------+---------+----------+-----------+------------+---------------+  GSV prox thighno                                                        +--------------+---------+----------+-----------+------------+---------------+ GSV mid thigh no                                                        +--------------+---------+----------+-----------+------------+---------------+ GSV dist thighno                                                         +--------------+---------+----------+-----------+------------+---------------+ GSV at knee   no                                                        +--------------+---------+----------+-----------+------------+---------------+ SSV Pop Fossa no                                        Wall thickening +--------------+---------+----------+-----------+------------+---------------+ Summary: Left: - No evidence of deep vein thrombosis seen in the left lower extremity, from the common femoral through the popliteal veins. - No evidence of superficial venous thrombosis in the left lower extremity. - There is no evidence of venous reflux seen in the left lower extremity.  *See table(s) above for measurements and observations. Electronically signed by Hortencia Pilar MD on 09/19/2020 at 5:14:46 PM.    Final      Assessment/Plan 1. Symptomatic carotid artery stenosis without infarction, unspecified laterality Recommend:  The patient is symptomatic with respect to the left carotid stenosis.  The patient now has progressed and has a left lesion the is >70%.  Patient's CT angiography of the carotid arteries confirms >70% left ICA stenosis.  The anatomical considerations support stenting over surgery.  This was discussed in detail with the patient.  The risks, benefits and alternative therapies were reviewed in detail with the patient.  All questions were answered.  The patient agrees to proceed with stenting of the left carotid artery.  Continue antiplatelet therapy as prescribed. Continue management of CAD, HTN and Hyperlipidemia. Healthy heart diet, encouraged exercise at least 4 times per week.    2. Lymphedema  No surgery or intervention at this point in time.    I have reviewed my previous discussion with the patient regarding swelling and why it  causes symptoms.  The patient is doing well with compression and will continue wearing graduated compression stockings class 1  (20-30 mmHg) on a daily basis a prescription was given. The patient will  continue wearing the stockings first thing in the morning and removing them in the evening. The patient is instructed specifically not to sleep in the stockings.    In addition, behavioral modification including elevation during the day and exercise will be continued.  Patient should follow-up on an annual basis    3. HTN (hypertension), benign Continue antihypertensive medications as already ordered, these medications have been reviewed and there are no changes at this time.   4. Chronic atrial fibrillation (HCC) Continue antiarrhythmia medications as already ordered, these medications have been reviewed and there are no changes at this time.  Continue anticoagulation as ordered by Cardiology Service   5. Uncontrolled type 2 diabetes mellitus with hyperglycemia ([place]) Continue hypoglycemic medications as already ordered, these medications have been reviewed and there are no changes at this time.  Hgb A1C to be monitored as already arranged by primary service    Hortencia Pilar, MD  09/26/2020 1:00 PM

## 2020-09-29 ENCOUNTER — Encounter (INDEPENDENT_AMBULATORY_CARE_PROVIDER_SITE_OTHER): Payer: Self-pay | Admitting: Vascular Surgery

## 2020-10-17 ENCOUNTER — Ambulatory Visit: Payer: Medicare HMO | Admitting: Podiatry

## 2020-11-06 ENCOUNTER — Telehealth (INDEPENDENT_AMBULATORY_CARE_PROVIDER_SITE_OTHER): Payer: Self-pay

## 2020-11-06 NOTE — Telephone Encounter (Signed)
I called Jared Ball to get the patient scheduled for his left carotid stent placement. I was told by Jared Ball to call back tomorrow to schedule the patient.

## 2020-11-07 NOTE — Telephone Encounter (Signed)
Amy called back and the patient is scheduled for a left carotid stent placement on 11/26/20 with a 6:45 am arrival time to the [facility]. Pre-procedure instructions were discussed and will be mailed

## 2020-11-12 DIAGNOSIS — I6522 Occlusion and stenosis of left carotid artery: Secondary | ICD-10-CM

## 2020-11-21 NOTE — Telephone Encounter (Signed)
Jared Ball was given the information to have the patient do a covid test on 11/25/20 between 8-12 pm at the [facility] the day before his procedure on 11/26/20.

## 2020-11-22 ENCOUNTER — Other Ambulatory Visit: Payer: Medicare HMO

## 2020-11-25 ENCOUNTER — Other Ambulatory Visit
Admission: RE | Admit: 2020-11-25 | Discharge: 2020-11-25 | Disposition: A | Discharge status: Home / Self Care | Payer: Medicare HMO | Source: Ambulatory Visit | Attending: Vascular Surgery | Admitting: Vascular Surgery

## 2020-11-25 ENCOUNTER — Other Ambulatory Visit: Payer: Self-pay

## 2020-11-25 ENCOUNTER — Other Ambulatory Visit (INDEPENDENT_AMBULATORY_CARE_PROVIDER_SITE_OTHER): Payer: Self-pay | Admitting: Nurse Practitioner

## 2020-11-25 DIAGNOSIS — I6522 Occlusion and stenosis of left carotid artery: Secondary | ICD-10-CM

## 2020-11-25 DIAGNOSIS — Z20822 Contact with and (suspected) exposure to covid-19: Secondary | ICD-10-CM

## 2020-11-25 DIAGNOSIS — Z01812 Encounter for preprocedural laboratory examination: Secondary | ICD-10-CM | POA: Insufficient documentation

## 2020-11-26 ENCOUNTER — Encounter
Admission: RE | Disposition: A | Discharge status: Home / Self Care | Payer: Self-pay | Source: Home / Self Care | Attending: Vascular Surgery

## 2020-11-26 ENCOUNTER — Inpatient Hospital Stay
Admission: RE | Admit: 2020-11-26 | Discharge: 2020-11-27 | DRG: 035 | Disposition: A | Discharge status: Home / Self Care | Payer: Medicare HMO | Attending: Vascular Surgery | Admitting: Vascular Surgery

## 2020-11-26 ENCOUNTER — Encounter: Payer: Self-pay | Admitting: Vascular Surgery

## 2020-11-26 DIAGNOSIS — Z888 Allergy status to other drugs, medicaments and biological substances status: Secondary | ICD-10-CM

## 2020-11-26 DIAGNOSIS — K219 Gastro-esophageal reflux disease without esophagitis: Secondary | ICD-10-CM | POA: Diagnosis present

## 2020-11-26 DIAGNOSIS — E7800 Pure hypercholesterolemia, unspecified: Secondary | ICD-10-CM | POA: Diagnosis present

## 2020-11-26 DIAGNOSIS — E785 Hyperlipidemia, unspecified: Secondary | ICD-10-CM | POA: Diagnosis present

## 2020-11-26 DIAGNOSIS — I6522 Occlusion and stenosis of left carotid artery: Principal | ICD-10-CM | POA: Diagnosis present

## 2020-11-26 DIAGNOSIS — Z7902 Long term (current) use of antithrombotics/antiplatelets: Secondary | ICD-10-CM | POA: Diagnosis not present

## 2020-11-26 DIAGNOSIS — H9190 Unspecified hearing loss, unspecified ear: Secondary | ICD-10-CM | POA: Diagnosis present

## 2020-11-26 DIAGNOSIS — Z87891 Personal history of nicotine dependence: Secondary | ICD-10-CM

## 2020-11-26 DIAGNOSIS — Z87442 Personal history of urinary calculi: Secondary | ICD-10-CM

## 2020-11-26 DIAGNOSIS — E1122 Type 2 diabetes mellitus with diabetic chronic kidney disease: Secondary | ICD-10-CM | POA: Diagnosis present

## 2020-11-26 DIAGNOSIS — N400 Enlarged prostate without lower urinary tract symptoms: Secondary | ICD-10-CM | POA: Diagnosis present

## 2020-11-26 DIAGNOSIS — I482 Chronic atrial fibrillation, unspecified: Secondary | ICD-10-CM | POA: Diagnosis present

## 2020-11-26 DIAGNOSIS — Z20822 Contact with and (suspected) exposure to covid-19: Secondary | ICD-10-CM | POA: Diagnosis present

## 2020-11-26 DIAGNOSIS — M4800 Spinal stenosis, site unspecified: Secondary | ICD-10-CM | POA: Diagnosis present

## 2020-11-26 DIAGNOSIS — E1165 Type 2 diabetes mellitus with hyperglycemia: Secondary | ICD-10-CM | POA: Diagnosis present

## 2020-11-26 DIAGNOSIS — G4730 Sleep apnea, unspecified: Secondary | ICD-10-CM | POA: Diagnosis present

## 2020-11-26 DIAGNOSIS — I2510 Atherosclerotic heart disease of native coronary artery without angina pectoris: Secondary | ICD-10-CM | POA: Diagnosis present

## 2020-11-26 DIAGNOSIS — Z79899 Other long term (current) drug therapy: Secondary | ICD-10-CM

## 2020-11-26 DIAGNOSIS — I6521 Occlusion and stenosis of right carotid artery: Secondary | ICD-10-CM | POA: Diagnosis present

## 2020-11-26 DIAGNOSIS — N181 Chronic kidney disease, stage 1: Secondary | ICD-10-CM | POA: Diagnosis present

## 2020-11-26 DIAGNOSIS — M1990 Unspecified osteoarthritis, unspecified site: Secondary | ICD-10-CM | POA: Diagnosis present

## 2020-11-26 DIAGNOSIS — I129 Hypertensive chronic kidney disease with stage 1 through stage 4 chronic kidney disease, or unspecified chronic kidney disease: Secondary | ICD-10-CM | POA: Diagnosis present

## 2020-11-26 DIAGNOSIS — Z887 Allergy status to serum and vaccine status: Secondary | ICD-10-CM

## 2020-11-26 DIAGNOSIS — Z882 Allergy status to sulfonamides status: Secondary | ICD-10-CM | POA: Diagnosis not present

## 2020-11-26 DIAGNOSIS — I890 Lymphedema, not elsewhere classified: Secondary | ICD-10-CM | POA: Diagnosis present

## 2020-11-26 DIAGNOSIS — Z91048 Other nonmedicinal substance allergy status: Secondary | ICD-10-CM

## 2020-11-26 DIAGNOSIS — Z7984 Long term (current) use of oral hypoglycemic drugs: Secondary | ICD-10-CM

## 2020-11-26 DIAGNOSIS — Z7982 Long term (current) use of aspirin: Secondary | ICD-10-CM | POA: Diagnosis not present

## 2020-11-26 DIAGNOSIS — Z841 Family history of disorders of kidney and ureter: Secondary | ICD-10-CM

## 2020-11-26 HISTORY — PX: CAROTID PTA/STENT INTERVENTION: CATH118231

## 2020-11-26 LAB — HEMOGLOBIN A1C
Hgb A1c MFr Bld: 7.2 % — ABNORMAL HIGH (ref 4.8–5.6)
Mean Plasma Glucose: 159.94 mg/dL

## 2020-11-26 LAB — POCT ACTIVATED CLOTTING TIME
Activated Clotting Time: 260 seconds
Activated Clotting Time: 208 seconds
Activated Clotting Time: 208 seconds

## 2020-11-26 LAB — SARS CORONAVIRUS 2 (TAT 6-24 HRS): SARS Coronavirus 2: NEGATIVE

## 2020-11-26 LAB — BUN: BUN: 25 mg/dL — ABNORMAL HIGH (ref 8–23)

## 2020-11-26 LAB — GLUCOSE, CAPILLARY
Glucose-Capillary: 185 mg/dL — ABNORMAL HIGH (ref 70–99)
Glucose-Capillary: 177 mg/dL — ABNORMAL HIGH (ref 70–99)
Glucose-Capillary: 211 mg/dL — ABNORMAL HIGH (ref 70–99)
Glucose-Capillary: 240 mg/dL — ABNORMAL HIGH (ref 70–99)

## 2020-11-26 LAB — CREATININE, SERUM
Creatinine, Ser: 1.09 mg/dL (ref 0.61–1.24)
GFR, Estimated: >60 mL/min (ref >60)

## 2020-11-26 LAB — MRSA NEXT GEN BY PCR, NASAL: MRSA by PCR Next Gen: NOT DETECTED

## 2020-11-26 SURGERY — CAROTID PTA/STENT INTERVENTION
Anesthesia: Moderate Sedation | Laterality: Left

## 2020-11-26 MED ORDER — PANTOPRAZOLE SODIUM 40 MG IV SOLR
40.0000 mg | Freq: Every day | INTRAVENOUS | Status: DC
  Administered 2020-11-26: 40 mg via INTRAVENOUS
  Filled 2020-11-26: qty 40

## 2020-11-26 MED ORDER — MIDAZOLAM HCL 2 MG/ML PO SYRP: 8.0000 mg | ORAL_SOLUTION | Freq: Once | ORAL | Status: DC | PRN

## 2020-11-26 MED ORDER — TORSEMIDE 10 MG PO TABS
5.0000 mg | ORAL_TABLET | Freq: Every day | ORAL | Status: DC
  Administered 2020-11-27: 5 mg via ORAL
  Filled 2020-11-26: qty 0.5

## 2020-11-26 MED ORDER — HEPARIN SODIUM (PORCINE) 1000 UNIT/ML IJ SOLN
INTRAMUSCULAR | Status: AC
  Filled 2020-11-26: qty 1

## 2020-11-26 MED ORDER — ATROPINE SULFATE 1 MG/10ML IJ SOSY
PREFILLED_SYRINGE | INTRAMUSCULAR | Status: AC
  Filled 2020-11-26: qty 10

## 2020-11-26 MED ORDER — CEFAZOLIN SODIUM-DEXTROSE 2-4 GM/100ML-% IV SOLN
2.0000 g | Freq: Once | INTRAVENOUS | Status: AC
  Administered 2020-11-26: 2 g via INTRAVENOUS

## 2020-11-26 MED ORDER — OXYCODONE-ACETAMINOPHEN 5-325 MG PO TABS: 1.0000 | ORAL_TABLET | ORAL | Status: DC | PRN

## 2020-11-26 MED ORDER — METHYLPREDNISOLONE SODIUM SUCC 125 MG IJ SOLR: 125.0000 mg | Freq: Once | INTRAMUSCULAR | Status: DC | PRN

## 2020-11-26 MED ORDER — MORPHINE SULFATE (PF) 4 MG/ML IV SOLN: 2.0000 mg | INTRAVENOUS | Status: DC | PRN

## 2020-11-26 MED ORDER — HEPARIN SODIUM (PORCINE) 1000 UNIT/ML IJ SOLN
INTRAMUSCULAR | Status: DC | PRN
  Administered 2020-11-26: 8000 [IU] via INTRAVENOUS
  Administered 2020-11-26: 4000 [IU] via INTRAVENOUS

## 2020-11-26 MED ORDER — LABETALOL HCL 5 MG/ML IV SOLN: 10.0000 mg | INTRAVENOUS | Status: DC | PRN

## 2020-11-26 MED ORDER — PRAVASTATIN SODIUM 40 MG PO TABS
40.0000 mg | ORAL_TABLET | Freq: Every day | ORAL | Status: DC
  Administered 2020-11-26: 40 mg via ORAL
  Filled 2020-11-26: qty 2
  Filled 2020-11-26 (×2): qty 1

## 2020-11-26 MED ORDER — ACETAMINOPHEN 325 MG RE SUPP
325.0000 mg | RECTAL | Status: DC | PRN
  Filled 2020-11-26: qty 2

## 2020-11-26 MED ORDER — MIDAZOLAM HCL 2 MG/2ML IJ SOLN
INTRAMUSCULAR | Status: DC | PRN
  Administered 2020-11-26: 2 mg via INTRAVENOUS
  Administered 2020-11-26: .5 mg via INTRAVENOUS

## 2020-11-26 MED ORDER — PHENYLEPHRINE 40 MCG/ML (10ML) SYRINGE FOR IV PUSH (FOR BLOOD PRESSURE SUPPORT)
PREFILLED_SYRINGE | INTRAVENOUS | Status: AC
  Filled 2020-11-26: qty 10

## 2020-11-26 MED ORDER — SODIUM CHLORIDE 0.9 % IV SOLN: 500.0000 mL | Freq: Once | INTRAVENOUS | Status: DC | PRN

## 2020-11-26 MED ORDER — PANTOPRAZOLE SODIUM 40 MG PO TBEC: 40.0000 mg | DELAYED_RELEASE_TABLET | Freq: Every day | ORAL | Status: DC

## 2020-11-26 MED ORDER — METOPROLOL TARTRATE 5 MG/5ML IV SOLN
2.0000 mg | INTRAVENOUS | Status: DC | PRN
Start: 2020-11-26 — End: 2020-11-27

## 2020-11-26 MED ORDER — CLOPIDOGREL BISULFATE 75 MG PO TABS: 75.0000 mg | ORAL_TABLET | Freq: Every day | ORAL | Status: DC

## 2020-11-26 MED ORDER — TAMSULOSIN HCL 0.4 MG PO CAPS
0.4000 mg | ORAL_CAPSULE | Freq: Every day | ORAL | Status: DC
  Filled 2020-11-26: qty 1

## 2020-11-26 MED ORDER — SODIUM CHLORIDE 0.9 % IV SOLN: INTRAVENOUS | Status: DC

## 2020-11-26 MED ORDER — HYDRALAZINE HCL 20 MG/ML IJ SOLN: 5.0000 mg | INTRAMUSCULAR | Status: DC | PRN

## 2020-11-26 MED ORDER — ALUM & MAG HYDROXIDE-SIMETH 200-200-20 MG/5ML PO SUSP: 15.0000 mL | ORAL | Status: DC | PRN

## 2020-11-26 MED ORDER — ISOSORBIDE MONONITRATE ER 60 MG PO TB24
60.0000 mg | ORAL_TABLET | Freq: Every day | ORAL | Status: DC
  Administered 2020-11-27: 60 mg via ORAL
  Filled 2020-11-26: qty 1

## 2020-11-26 MED ORDER — PANTOPRAZOLE SODIUM 40 MG IV SOLR: 40.0000 mg | Freq: Every day | INTRAVENOUS | Status: DC

## 2020-11-26 MED ORDER — CALCIUM CARBONATE ANTACID 500 MG PO CHEW
2.0000 | CHEWABLE_TABLET | Freq: Every day | ORAL | Status: DC
  Administered 2020-11-27: 400 mg via ORAL
  Filled 2020-11-26: qty 2

## 2020-11-26 MED ORDER — CLOPIDOGREL BISULFATE 75 MG PO TABS
75.0000 mg | ORAL_TABLET | Freq: Every day | ORAL | Status: DC
  Administered 2020-11-27: 75 mg via ORAL
  Filled 2020-11-26: qty 1

## 2020-11-26 MED ORDER — FENTANYL CITRATE PF 50 MCG/ML IJ SOSY
PREFILLED_SYRINGE | INTRAMUSCULAR | Status: AC
  Filled 2020-11-26: qty 2

## 2020-11-26 MED ORDER — ACETAMINOPHEN 325 MG PO TABS
325.0000 mg | ORAL_TABLET | ORAL | Status: DC | PRN
  Administered 2020-11-26: 650 mg via ORAL
  Filled 2020-11-26: qty 2

## 2020-11-26 MED ORDER — DEXTROMETHORPHAN POLISTIREX ER 30 MG/5ML PO SUER: 15.0000 mg | Freq: Two times a day (BID) | ORAL | Status: DC | PRN

## 2020-11-26 MED ORDER — HYDROMORPHONE HCL 1 MG/ML IJ SOLN
1.0000 mg | Freq: Once | INTRAMUSCULAR | Status: DC | PRN
Start: 2020-11-26 — End: 2020-11-26

## 2020-11-26 MED ORDER — ONDANSETRON HCL 4 MG/2ML IJ SOLN: 4.0000 mg | Freq: Four times a day (QID) | INTRAMUSCULAR | Status: DC | PRN

## 2020-11-26 MED ORDER — AMIODARONE HCL 100 MG PO TABS
100.0000 mg | ORAL_TABLET | Freq: Every day | ORAL | Status: DC
  Administered 2020-11-27: 100 mg via ORAL
  Filled 2020-11-26: qty 1

## 2020-11-26 MED ORDER — LOPERAMIDE HCL 2 MG PO CAPS: 2.0000 mg | ORAL_CAPSULE | Freq: Three times a day (TID) | ORAL | Status: DC | PRN

## 2020-11-26 MED ORDER — PHENYLEPHRINE HCL-NACL 20-0.9 MG/250ML-% IV SOLN
INTRAVENOUS | Status: AC
  Filled 2020-11-26: qty 250

## 2020-11-26 MED ORDER — MIDAZOLAM HCL 5 MG/5ML IJ SOLN
INTRAMUSCULAR | Status: AC
  Filled 2020-11-26: qty 5

## 2020-11-26 MED ORDER — PHENOL 1.4 % MT LIQD
1.0000 | OROMUCOSAL | Status: DC | PRN
  Filled 2020-11-26: qty 177

## 2020-11-26 MED ORDER — ATROPINE SULFATE 1 MG/10ML IJ SOSY
PREFILLED_SYRINGE | INTRAMUSCULAR | Status: DC | PRN
  Administered 2020-11-26: 1 mg via INTRAVENOUS

## 2020-11-26 MED ORDER — FLUTICASONE PROPIONATE 50 MCG/ACT NA SUSP
1.0000 | Freq: Two times a day (BID) | NASAL | Status: DC | PRN
  Filled 2020-11-26: qty 16

## 2020-11-26 MED ORDER — VITAMIN B-12 1000 MCG PO TABS
1000.0000 ug | ORAL_TABLET | Freq: Every day | ORAL | Status: DC
Start: 2020-11-27 — End: 2020-11-27
  Administered 2020-11-27: 1000 ug via ORAL
  Filled 2020-11-26: qty 1

## 2020-11-26 MED ORDER — CEFAZOLIN SODIUM-DEXTROSE 2-4 GM/100ML-% IV SOLN
2.0000 g | Freq: Three times a day (TID) | INTRAVENOUS | Status: AC
  Administered 2020-11-26 – 2020-11-27 (×2): 2 g via INTRAVENOUS
  Filled 2020-11-26 (×2): qty 100

## 2020-11-26 MED ORDER — DOCUSATE SODIUM 100 MG PO CAPS
100.0000 mg | ORAL_CAPSULE | Freq: Every day | ORAL | Status: DC
  Administered 2020-11-27: 100 mg via ORAL
  Filled 2020-11-26: qty 1

## 2020-11-26 MED ORDER — FENTANYL CITRATE (PF) 100 MCG/2ML IJ SOLN
INTRAMUSCULAR | Status: DC | PRN
  Administered 2020-11-26: 50 ug via INTRAVENOUS
  Administered 2020-11-26: 12.5 ug via INTRAVENOUS

## 2020-11-26 MED ORDER — CHLORHEXIDINE GLUCONATE CLOTH 2 % EX PADS
6.0000 | MEDICATED_PAD | Freq: Every day | CUTANEOUS | Status: DC
  Administered 2020-11-26: 6 via TOPICAL

## 2020-11-26 MED ORDER — ASPIRIN EC 81 MG PO TBEC
81.0000 mg | DELAYED_RELEASE_TABLET | Freq: Every day | ORAL | Status: DC
  Filled 2020-11-26: qty 1

## 2020-11-26 MED ORDER — KCL IN DEXTROSE-NACL 20-5-0.9 MEQ/L-%-% IV SOLN
INTRAVENOUS | Status: AC
  Filled 2020-11-26: qty 1000

## 2020-11-26 MED ORDER — GUAIFENESIN-DM 100-10 MG/5ML PO SYRP
15.0000 mL | ORAL_SOLUTION | ORAL | Status: DC | PRN
  Filled 2020-11-26: qty 15

## 2020-11-26 MED ORDER — FAMOTIDINE 20 MG PO TABS: 40.0000 mg | ORAL_TABLET | Freq: Once | ORAL | Status: DC | PRN

## 2020-11-26 MED ORDER — INSULIN ASPART 100 UNIT/ML IJ SOLN
0.0000 [IU] | Freq: Three times a day (TID) | INTRAMUSCULAR | Status: DC
Start: 2020-11-26 — End: 2020-11-27
  Administered 2020-11-27: 3 [IU] via SUBCUTANEOUS
  Administered 2020-11-27: 5 [IU] via SUBCUTANEOUS
  Administered 2020-11-27: 3 [IU] via SUBCUTANEOUS
  Filled 2020-11-26 (×3): qty 1

## 2020-11-26 MED ORDER — DIPHENHYDRAMINE HCL 50 MG/ML IJ SOLN: 50.0000 mg | Freq: Once | INTRAMUSCULAR | Status: DC | PRN

## 2020-11-26 SURGICAL SUPPLY — 24 items
BALLN VIATRAC 5X15X135 (BALLOONS) ×3
BALLOON VIATRAC 5X15X135 (BALLOONS) IMPLANT
CATH ANGIO 5F PIGTAIL 100CM (CATHETERS) ×2 IMPLANT
CATH SIM1 5FR 125CM (CATHETERS) ×2 IMPLANT
COVER DRAPE FLUORO 36X44 (DRAPES) ×2 IMPLANT
DEVICE EMBOSHIELD NAV6 4.0-7.0 (FILTER) ×2 IMPLANT
DEVICE SAFEGUARD 24CM (GAUZE/BANDAGES/DRESSINGS) ×2 IMPLANT
DEVICE STARCLOSE SE CLOSURE (Vascular Products) ×2 IMPLANT
DEVICE TORQUE .025-.038 (MISCELLANEOUS) ×2 IMPLANT
GAUZE SPONGE 4X4 12PLY STRL (GAUZE/BANDAGES/DRESSINGS) ×4 IMPLANT
GLIDEWIRE ANGLED SS 035X260CM (WIRE) ×2 IMPLANT
GUIDEWIRE VASC STIFF .038X260 (WIRE) ×2 IMPLANT
KIT CAROTID MANIFOLD (MISCELLANEOUS) ×2 IMPLANT
KIT ENCORE 26 ADVANTAGE (KITS) ×2 IMPLANT
NDL ENTRY 21GA 7CM ECHOTIP (NEEDLE) IMPLANT
NEEDLE ENTRY 21GA 7CM ECHOTIP (NEEDLE) ×3 IMPLANT
PACK ANGIOGRAPHY (CUSTOM PROCEDURE TRAY) ×3 IMPLANT
SET INTRO CAPELLA COAXIAL (SET/KITS/TRAYS/PACK) ×2 IMPLANT
SHEATH BRITE TIP 5FRX11 (SHEATH) ×2 IMPLANT
SHEATH NEURON MAX 6FR 80CM (SHEATH) ×2 IMPLANT
STENT XACT CAR 10-8X30X136 (Permanent Stent) ×2 IMPLANT
SYR MEDRAD MARK 7 150ML (SYRINGE) ×2 IMPLANT
TUBING CONTRAST HIGH PRESS 72 (TUBING) ×2 IMPLANT
WIRE GUIDERIGHT .035X150 (WIRE) ×2 IMPLANT

## 2020-11-26 NOTE — Op Note (Signed)
OPERATIVE NOTE DATE: 11/26/2020  PROCEDURE:  Ultrasound guidance for vascular access right common femoral artery  Placement of a 10 x 8 x 30 exact stent with the use of the NAV-6 embolic protection device in the left internal carotid artery  PRE-OPERATIVE DIAGNOSIS: 1.  Greater than 90% left internal carotid artery stenosis. 2.  Left hemispheric CVA  POST-OPERATIVE DIAGNOSIS:  Same as above  SURGEON: Hortencia Pilar  ASSISTANT(S): None  ANESTHESIA: local/MCS  ESTIMATED BLOOD LOSS: 100 cc  CONTRAST: 100 cc  FLUORO TIME: 5.3 minutes  MODERATE CONSCIOUS SEDATION TIME: Continuous ECG pulse oximetry and cardiopulmonary monitoring was performed throughout the entire procedure by the interventional radiology nurse total sedation time was 56 minutes  FINDING(S): 1.   Greater than 90% stenosis of the left internal carotid artery carotid artery stenosis  SPECIMEN(S):   none  INDICATIONS:   Patient is a 87 y.o. male who presents with a left hemispheric CVA and greater than 90% carotid artery stenosis.  The patient has unstable angina and not unreconstructable coronary artery disease as well as a high bifurcation and carotid artery stenting was felt to be preferred to endarterectomy for that reason.  Risks and benefits were discussed and informed consent was obtained.   DESCRIPTION: After obtaining full informed written consent, the patient was brought back to the vascular suite and placed supine upon the table.  The patient received IV antibiotics prior to induction. Moderate conscious sedation was administered during a face to face encounter with the patient throughout the procedure with my supervision of the RN administering medicines and monitoring the patients vital signs and mental status throughout from the start of the procedure until the patient was taken to the recovery room.    After obtaining adequate sedation, the patient was prepped and draped in the standard fashion.     A first assistant is required in order to allow for a safe and more efficient operation.  Duties include wire manipulations as well as assistance with pinning the sheath and positioning the detector for proper angle, assistance and deploying the stent in the proper position and appropriate images.  Further duties include assisting with patient positioning during the procedure.  I believe that this procedure requires a first assistant in order for it to be performed at a level in keeping with the high standards of this institution.  The right femoral artery was visualized with ultrasound and found to be widely patent. It was then accessed under direct ultrasound guidance without difficulty with a micropuncture needle. A permanent image was recorded.  A microwire was then advanced without difficulty under fluoroscopic guidance followed by a micro-sheath.  A J-wire was placed and we then placed a 6 French sheath. The patient was then heparinized and a total of 12,000 units of intravenous heparin were given and an ACT was checked to confirm successful anticoagulation.   A pigtail catheter was then placed into the ascending aorta. This showed type I bovine arch. The left artery was then selectively cannulated without difficulty with a Simmons 1 catheter and the catheter advanced into the mid left common carotid artery.  Cervical and cerebral carotid angiography was then performed. There were no obvious intracranial filling defects. The carotid bifurcation demonstrated greater than 90% stenosis approximately 1 cm distal to the origin in the left internal carotid artery.  I then advanced into the external carotid artery with a Glidewire and the Simmons 1 catheter and then exchanged for the Amplatz Super Stiff wire. Over the  Amplatz Super Stiff wire, a 6 [place] shuttle sheath was placed into the mid common carotid artery. I then used the large NAV-6  Embolic protection device and crossed the lesion and parked this  in the distal internal carotid artery at the base of the skull.  I then selected a 10 x 8 x 30 exact stent. This was deployed across the lesion encompassing it in its entirety. A 5 mm x 15 mm length balloon was used to post dilate the stent. Only about a 15% residual stenosis was present after angioplasty. Completion angiogram showed normal intracranial filling without new defects. At this point I elected to terminate the procedure. The sheath was removed and StarClose closure device was deployed in the right femoral artery with excellent hemostatic result. The patient was taken to the recovery room in stable condition having tolerated the procedure well.  COMPLICATIONS: none  CONDITION: stable  Hortencia Pilar 11/26/2020 9:35 AM   This note was created with Dragon Medical transcription system. Any errors in dictation are purely unintentional.

## 2020-11-26 NOTE — Plan of Care (Signed)

## 2020-11-26 NOTE — H&P (Signed)
@LOGO @   MRN : [id number]  Jared Ball is a 87 y.o. (03-04-1932) male who presents with chief complaint of fix my carotid.  History of Present Illness:   The patient presents to [facility] for treatment of his critical symptomatic carotid stenosis on the left. CT angiogram was done 05/21/2020. Patient reports that the test went well with no problems or complications.    The patient denies interval amaurosis fugax. There is no recent or interval TIA symptoms or focal motor deficits. There is no prior documented CVA.   The patient is taking enteric-coated aspirin 81 mg daily.   There is no history of migraine headaches. There is no history of seizures.   The patient has a history of coronary artery disease, no recent episodes of angina or shortness of breath. The patient denies PAD or claudication symptoms. There is a history of hyperlipidemia which is being treated with a statin.     CT angiogram is reviewed by me personally and shows >90% stenosis consistent with calcified plaque at the origin of the left internal carotid artery.   Current Meds  Medication Sig   acetaminophen (TYLENOL) 500 MG tablet Take 500 mg by mouth every 8 (eight) hours as needed for mild pain or moderate pain.    amiodarone (PACERONE) 100 MG tablet Take 100 mg by mouth daily.   APPLE CIDER VINEGAR PO Take 7 g by mouth once a week.   aspirin EC 81 MG tablet Take 81 mg by mouth daily.    bisacodyl (DULCOLAX) 5 MG EC tablet Take 5 mg by mouth daily as needed for moderate constipation.   BLACK ELDERBERRY PO Take 300 mg by mouth See admin instructions. Couple times a month   calcium elemental as carbonate (TUMS ULTRA 1000) 400 MG chewable tablet Chew 1,000 mg by mouth daily as needed for heartburn.   carboxymethylcellulose (REFRESH PLUS) 0.5 % SOLN Place 1 drop into both eyes daily.   Cholecalciferol 25 MCG (1000 UT) tablet Take 1,000 Units by mouth daily.    clopidogrel (PLAVIX) 75 MG tablet Take 75 mg by mouth daily.    Cyanocobalamin 1000 MCG CAPS Take 1,000 mcg by mouth daily. Gummies   dextromethorphan (DELSYM) 30 MG/5ML liquid Take 15 mg by mouth 2 (two) times daily as needed for cough.   finasteride (PROSCAR) 5 MG tablet Take 1 tablet (5 mg total) by mouth daily.   fluticasone (FLONASE) 50 MCG/ACT nasal spray Place 1 spray into the nose 2 (two) times daily as needed for allergies.   isosorbide mononitrate (IMDUR) 60 MG 24 hr tablet Take 60 mg by mouth daily.   loperamide (IMODIUM A-D) 2 MG tablet Take 2 mg by mouth 3 (three) times daily as needed for diarrhea or loose stools.    lovastatin (MEVACOR) 40 MG tablet Take 40 mg by mouth at bedtime.    metFORMIN (GLUCOPHAGE) 500 MG tablet Take 500 mg by mouth 3 (three) times daily.    metoprolol tartrate (LOPRESSOR) 25 MG tablet Take 25 mg by mouth daily as needed (High blood pressure over 140).   Multiple Vitamins-Minerals (MULTIVITAMIN WITH MINERALS) tablet Take 1 tablet by mouth daily. Centrum silver 50 +   pantoprazole (PROTONIX) 40 MG tablet Take 40 mg by mouth daily.    Pramox-PE-Glycerin-Petrolatum (PREPARATION H) 1-0.25-14.4-15 % CREA Apply 1 Applicatorful topically daily as needed (Hemorrhoids).   ranolazine (RANEXA) 500 MG 12 hr tablet Take 500 mg by mouth 2 (two) times daily.    tamsulosin (FLOMAX) 0.4 MG CAPS  capsule Take 1 capsule (0.4 mg total) by mouth daily.   torsemide (DEMADEX) 5 MG tablet Take 5 mg by mouth daily.    triamcinolone ointment (KENALOG) 0.1 % Apply 1 application topically daily as needed (rash).    Past Medical History:  Diagnosis Date   Anxiety    Arthritis    Atrial fibrillation (HCC)    BPH (benign prostatic hyperplasia)    Bronchitis    Chronic kidney disease    Cognitive changes    Diabetes mellitus without complication (HCC)    Type 2   Dysrhythmia    GERD (gastroesophageal reflux disease)    Headache    History of kidney stones    hx of   HOH (hard of hearing)    Hypercholesteremia    Hypertension    IBS  (irritable bowel syndrome)    PONV (postoperative nausea and vomiting)    Shingles 15 yrs ago   HX of   Shortness of breath dyspnea    heart related   Sleep apnea    no CPAP   Spinal stenosis    Vertigo    Vitamin D deficiency     Past Surgical History:  Procedure Laterality Date   CATARACT EXTRACTION W/PHACO Left 05/17/2019   Procedure: CATARACT EXTRACTION PHACO AND INTRAOCULAR LENS PLACEMENT ([place]) LEFT DIABETIC MALYUGIN;  Surgeon: Leandrew Koyanagi, MD;  Location: [facility];  Service: Ophthalmology;  Laterality: Left;  16.54 1:31.1 18.2%   CATARACT EXTRACTION W/PHACO Right 07/12/2019   Procedure: CATARACT EXTRACTION PHACO AND INTRAOCULAR LENS PLACEMENT ([facility]) RIGHT MALYUGIN DIABETIC 14.90 01:37.8 15.3%;  Surgeon: Leandrew Koyanagi, MD;  Location: [facility];  Service: Ophthalmology;  Laterality: Right;  Diabetic - oral meds Latex   COLONOSCOPY     CYSTOSCOPY W/ RETROGRADES Bilateral 07/02/2014   Procedure: CYSTOSCOPY WITH RETROGRADE PYELOGRAM;  Surgeon: Hollice Espy, MD;  Location: [facility];  Service: Urology;  Laterality: Bilateral;   CYSTOSCOPY WITH STENT PLACEMENT Left 07/02/2014   Procedure: CYSTOSCOPY WITH STENT PLACEMENT;  Surgeon: Hollice Espy, MD;  Location: [facility];  Service: Urology;  Laterality: Left;   DENTAL SURGERY     in the [place] CATH AND CORONARY ANGIOGRAPHY Left 11/09/2019   Procedure: LEFT HEART CATH AND CORONARY ANGIOGRAPHY with Radial Approach;  Surgeon: Yolonda Kida, MD;  Location: [facility];  Service: Cardiovascular;  Laterality: Left;   TONSILLECTOMY     and adenoids   URETEROSCOPY WITH HOLMIUM LASER LITHOTRIPSY Left 07/02/2014   Procedure: URETEROSCOPY WITH HOLMIUM LASER LITHOTRIPSY;  Surgeon: Hollice Espy, MD;  Location: [facility];  Service: Urology;  Laterality: Left;    Social History Social History   Tobacco Use   Smoking status: Former    Types: Cigarettes    Quit date: 1990    Years  since quitting: 32.8   Smokeless tobacco: Never  Substance Use Topics   Alcohol use: Yes    Alcohol/week: 0.0 standard drinks    Comment: red wine occasionally   Drug use: No    Family History Family History  Problem Relation Age of Onset   Kidney disease Mother    Prostate cancer Neg Hx    Kidney cancer Neg Hx    Bladder Cancer Neg Hx     Allergies  Allergen Reactions   Sulfa Antibiotics Nausea And Vomiting and Other (See Comments)   Tape Rash    adhesive   Metformin Diarrhea    High dose gives diarrhea    Sulfasalazine  Nausea Only and Nausea And Vomiting   Zoster Vaccine Live Hives and Rash    Localized; Vaccine for shingles      REVIEW OF SYSTEMS (Negative unless checked)  Constitutional: [] Weight loss  [] Fever  [] Chills Cardiac: [] Chest pain   [] Chest pressure   [] Palpitations   [] Shortness of breath when laying flat   [] Shortness of breath with exertion. Vascular:  [] Pain in legs with walking   [] Pain in legs at rest  [] History of DVT   [] Phlebitis   [] Swelling in legs   [] Varicose veins   [] Non-healing ulcers Pulmonary:   [] Uses home oxygen   [] Productive cough   [] Hemoptysis   [] Wheeze  [] COPD   [] Asthma Neurologic:  [] Dizziness   [] Seizures   [] History of stroke   [] History of TIA  [] Aphasia   [] Vissual changes   [] Weakness or numbness in arm   [] Weakness or numbness in leg Musculoskeletal:   [] Joint swelling   [] Joint pain   [] Low back pain Hematologic:  [] Easy bruising  [] Easy bleeding   [] Hypercoagulable state   [] Anemic Gastrointestinal:  [] Diarrhea   [] Vomiting  [] Gastroesophageal reflux/heartburn   [] Difficulty swallowing. Genitourinary:  [] Chronic kidney disease   [] Difficult urination  [] Frequent urination   [] Blood in urine Skin:  [] Rashes   [] Ulcers  Psychological:  [] History of anxiety   []  History of major depression.  Physical Examination  Vitals:   11/26/20 0723  BP: (!) 155/86  Pulse: 84  Resp: 18  Temp: 97.6 F (36.4 C)  TempSrc: Oral   SpO2: 95%  Weight: 97.5 kg  Height: 5\' 10"  (1.778 m)   Body mass index is 30.85 kg/m. Gen: WD/WN, NAD Head: [place]/AT, No temporalis wasting.  Ear/Nose/Throat: Hearing grossly intact, nares w/o erythema or drainage Eyes: PER, EOMI, sclera nonicteric.  Neck: Supple, no masses.  No bruit or JVD.  Pulmonary:  Good air movement, no audible wheezing, no use of accessory muscles.  Cardiac: RRR, normal S1, S2, no Murmurs. Vascular:   Left carotid bruit present Vessel Right Left  Radial Palpable Palpable  Carotid Palpable Palpable  Gastrointestinal: soft, non-distended. No guarding/no peritoneal signs.  Musculoskeletal: M/S 5/5 throughout.  No visible deformity.  Neurologic: CN 2-12 intact. Pain and light touch intact in extremities.  Symmetrical.  Speech is fluent. Motor exam as listed above. Psychiatric: Judgment intact, Mood & affect appropriate for pt's clinical situation. Dermatologic: No rashes or ulcers noted.  No changes consistent with cellulitis.   CBC Lab Results  Component Value Date   WBC 7.6 07/19/2016   HGB 15.8 07/19/2016   HCT 48.2 07/19/2016   MCV 85.6 07/19/2016   PLT 199 07/19/2016    BMET    Component Value Date/Time   NA 137 07/19/2016 1142   NA 138 01/01/2014 1821   K 4.4 07/19/2016 1142   K 4.3 01/01/2014 1821   CL 101 07/19/2016 1142   CL 103 01/01/2014 1821   CO2 29 07/19/2016 1142   CO2 28 01/01/2014 1821   GLUCOSE 240 (H) 07/19/2016 1142   GLUCOSE 109 (H) 01/01/2014 1821   BUN 24 (H) 07/19/2016 1142   BUN 25 (H) 01/01/2014 1821   CREATININE 1.30 (H) 05/21/2020 1502   CREATININE 1.20 01/01/2014 1821   CALCIUM 9.8 07/19/2016 1142   CALCIUM 9.2 01/01/2014 1821   GFRNONAA 54 (L) 07/19/2016 1142   GFRNONAA >60 01/01/2014 1821   GFRAA >60 07/19/2016 1142   GFRAA >60 01/01/2014 1821   CrCl cannot be calculated (Patient's most recent lab result  is older than the maximum 21 days allowed.).  COAG No results found for: INR, PROTIME  Radiology No  results found.   Assessment/Plan 1. Symptomatic carotid artery stenosis without infarction, unspecified laterality Recommend:   The patient is symptomatic with respect to the left carotid stenosis.  The patient now has progressed and has a left lesion the is >70%.   Patient's CT angiography of the carotid arteries confirms >70% left ICA stenosis.  The anatomical considerations support stenting over surgery.  This was discussed in detail with the patient.   The risks, benefits and alternative therapies were reviewed in detail with the patient.  All questions were answered.  The patient agrees to proceed with stenting of the left carotid artery.   Continue antiplatelet therapy as prescribed. Continue management of CAD, HTN and Hyperlipidemia. Healthy heart diet, encouraged exercise at least 4 times per week.     2. Lymphedema  No surgery or intervention at this point in time.     I have reviewed my previous discussion with the patient regarding swelling and why it  causes symptoms.  The patient is doing well with compression and will continue wearing graduated compression stockings class 1 (20-30 mmHg) on a daily basis a prescription was given. The patient will  continue wearing the stockings first thing in the morning and removing them in the evening. The patient is instructed specifically not to sleep in the stockings.     In addition, behavioral modification including elevation during the day and exercise will be continued.     Patient should follow-up on an annual basis     3. HTN (hypertension), benign Continue antihypertensive medications as already ordered, these medications have been reviewed and there are no changes at this time.    4. Chronic atrial fibrillation (HCC) Continue antiarrhythmia medications as already ordered, these medications have been reviewed and there are no changes at this time.   Continue anticoagulation as ordered by Cardiology Service    5. Uncontrolled  type 2 diabetes mellitus with hyperglycemia ([place]) Continue hypoglycemic medications as already ordered, these medications have been reviewed and there are no changes at this time.   Hgb A1C to be monitored as already arranged by primary service    Hortencia Pilar, MD  11/26/2020 8:00 AM

## 2020-11-26 NOTE — Interval H&P Note (Signed)
History and Physical Interval Note:  11/26/2020 8:03 AM  Jared Ball  has presented today for surgery, with the diagnosis of LT Carotid Stent    LT Carotid Stenosis   ABBOTT Rep   cc: Judi Cong.  The various methods of treatment have been discussed with the patient and family. After consideration of risks, benefits and other options for treatment, the patient has consented to  Procedure(s): CAROTID PTA/STENT INTERVENTION (Left) as a surgical intervention.  The patient's history has been reviewed, patient examined, no change in status, stable for surgery.  I have reviewed the patient's chart and labs.  Questions were answered to the patient's satisfaction.     Hortencia Pilar

## 2020-11-27 ENCOUNTER — Encounter: Payer: Self-pay | Admitting: Vascular Surgery

## 2020-11-27 DIAGNOSIS — I6521 Occlusion and stenosis of right carotid artery: Secondary | ICD-10-CM | POA: Diagnosis not present

## 2020-11-27 LAB — GLUCOSE, CAPILLARY
Glucose-Capillary: 173 mg/dL — ABNORMAL HIGH (ref 70–99)
Glucose-Capillary: 182 mg/dL — ABNORMAL HIGH (ref 70–99)
Glucose-Capillary: 211 mg/dL — ABNORMAL HIGH (ref 70–99)

## 2020-11-27 LAB — CBC
HCT: 39.7 % (ref 39.0–52.0)
Hemoglobin: 13.2 g/dL (ref 13.0–17.0)
MCH: 29.1 pg (ref 26.0–34.0)
MCHC: 33.2 g/dL (ref 30.0–36.0)
MCV: 87.4 fL (ref 80.0–100.0)
Platelets: 195 10*3/uL (ref 150–400)
RBC: 4.54 MIL/uL (ref 4.22–5.81)
RDW: 14.1 % (ref 11.5–15.5)
WBC: 10.3 10*3/uL (ref 4.0–10.5)
nRBC: 0 % (ref 0.0–0.2)

## 2020-11-27 LAB — BASIC METABOLIC PANEL
Anion gap: 8 (ref 5–15)
BUN: 16 mg/dL (ref 8–23)
CO2: 25 mmol/L (ref 22–32)
Calcium: 8.7 mg/dL — ABNORMAL LOW (ref 8.9–10.3)
Chloride: 104 mmol/L (ref 98–111)
Creatinine, Ser: 0.9 mg/dL (ref 0.61–1.24)
GFR, Estimated: >60 mL/min (ref >60)
Glucose, Bld: 151 mg/dL — ABNORMAL HIGH (ref 70–99)
Potassium: 3.9 mmol/L (ref 3.5–5.1)
Sodium: 137 mmol/L (ref 135–145)

## 2020-11-27 MED ORDER — PANTOPRAZOLE SODIUM 40 MG PO TBEC: 40.0000 mg | DELAYED_RELEASE_TABLET | Freq: Every day | ORAL | Status: DC

## 2020-11-27 MED ORDER — OXYCODONE-ACETAMINOPHEN 5-325 MG PO TABS: 1.0000 | ORAL_TABLET | ORAL | Status: DC | PRN

## 2020-11-27 NOTE — TOC Initial Note (Signed)
Transition of Care [facility]) - Initial/Assessment Note    Patient Details  Name: Jared Ball MRN: [id number] Date of Birth: 08-13-1932  Transition of [facility]) CM/SW Contact:    Kerin Salen, RN Phone Number: 11/27/2020, 3:13 PM  Clinical Narrative: TOCRN spoke with patient, Private Duty CNA Amy [phone] at bedside. Patient lives at [facility] at [place] provides meals, cooking and transportation to [facility] and [facility] [address]. Patient says he get [facility] services on weekend from [facility] Uses cane for ambulation however also has a rollator and power wheelchair that he use when shopping. PCP at the [facility]. Patient states he feels better since the procedure able to eat and ready to go home. No TOC needs assessed at this time, will continue to track.                  Expected Discharge Plan: Home/Self Care ([place]) Barriers to Discharge: Continued Medical Work up   Patient Goals and CMS Choice Patient states their goals for this hospitalization and ongoing recovery are:: Return to [place]      Expected Discharge Plan and Services Expected Discharge Plan: Home/Self Care ([place])       Living arrangements for the past 2 months: [place] Expected Discharge Date: 11/27/20                                    Prior Living Arrangements/Services Living arrangements for the past 2 months: [place] Lives with:: Self Patient language and need for interpreter reviewed:: Yes        Need for Family Participation in Patient Care: No (Comment) Care giver support system in place?: Yes (comment)   Criminal Activity/Legal Involvement Pertinent to Current Situation/Hospitalization: No - Comment as needed  Activities of Daily Living Home Assistive Devices/Equipment: None ADL Screening (condition at time of admission) Patient's cognitive ability  adequate to safely complete daily activities?: Yes Is the patient deaf or have difficulty hearing?: Yes Does the patient have difficulty seeing, even when wearing glasses/contacts?: Yes Does the patient have difficulty concentrating, remembering, or making decisions?: No Patient able to express need for assistance with ADLs?: Yes Does the patient have difficulty dressing or bathing?: No Independently performs ADLs?: No Communication: Needs assistance Dressing (OT): Needs assistance Is this a change from baseline?: Pre-admission baseline Grooming: Needs assistance Feeding: Independent with device (comment) Bathing: Needs assistance Toileting: Needs assistance In/Out Bed: Needs assistance Is this a change from baseline?: Pre-admission baseline Walks in Home: Independent with device (comment) Does the patient have difficulty walking or climbing stairs?: No Weakness of Legs: Both Weakness of Arms/Hands: None  Permission Sought/Granted                  Emotional Assessment Appearance:: Appears stated age Attitude/Demeanor/Rapport: Engaged Affect (typically observed): Accepting, Adaptable, Stable Orientation: : Oriented to Self, Oriented to Place, Oriented to  Time, Oriented to Situation Alcohol / Substance Use: Not Applicable Psych Involvement: No (comment)  Admission diagnosis:  Carotid stenosis, symptomatic w/o infarct, right [I65.21] Patient Active Problem List   Diagnosis Date Noted   Carotid stenosis, symptomatic w/o infarct, right 11/26/2020   Lymphedema 09/26/2020   Coagulation defect ([place]) 04/04/2020   Symptomatic carotid artery narrowing without infarction 10/23/2019   Chronic venous insufficiency 10/23/2019   Pain due to onychomycosis of toenails of both feet  08/10/2019   Stage 3a chronic kidney disease ([place]) 01/30/2019   Meningioma ([place]) 06/25/2017   Health care maintenance 12/16/2015   Type 2 diabetes mellitus not at goal [facility]) 09/03/2015   Chest pain  08/21/2014   Abnormal echocardiogram 08/21/2014   Normal radionuclide heart study 08/21/2014   Obesity 08/21/2014   Beat, premature ventricular 08/21/2014   Breath shortness 08/21/2014   Diabetes mellitus type 2, uncontrolled 08/21/2014   HTN (hypertension), benign 10/10/2013   Abnormal weight gain 06/23/2013   Spinal stenosis of lumbar region without neurogenic claudication 06/21/2013   Back pain 04/21/2013   Bursitis of hip 04/21/2013   Cough, persistent 12/29/2012   Productive cough 12/07/2012   Sinus infection 12/07/2012   Reflux 12/03/2011   Atrial fibrillation ([employer]) 07/13/2011   Elevated prostate specific antigen (PSA) 07/13/2011   Hypercholesteremia 07/13/2011   BP (high blood pressure) 07/13/2011   LBP (low back pain) 07/13/2011   Awareness of heartbeats 07/13/2011   Cutaneous eruption 07/13/2011   Apnea, sleep 07/13/2011   Type II diabetes mellitus ([place]) 07/13/2011   PCP:  Kirk Ruths, MD Pharmacy:   [id number] [address], [place] [address] Phone: [phone] Fax: [phone]     Social Determinants of Health (SDOH) Interventions    Readmission Risk Interventions No flowsheet data found.

## 2020-11-27 NOTE — Discharge Summary (Signed)
[facility]    Discharge Summary  Patient ID:  Jared Ball MRN: [id number] DOB/AGE: 09-18-1932 87 y.o.  Admit date: 11/26/2020 Discharge date: 11/27/2020 Date of Surgery: 11/26/2020 Surgeon: Surgeon(s): Schnier, Dolores Lory, MD  Admission Diagnosis: Carotid stenosis, symptomatic w/o infarct, right [I65.21]  Discharge Diagnoses:  Carotid stenosis, symptomatic w/o infarct, right [I65.21]  Secondary Diagnoses: Past Medical History:  Diagnosis Date   Anxiety    Arthritis    Atrial fibrillation (HCC)    BPH (benign prostatic hyperplasia)    Bronchitis    Chronic kidney disease    Cognitive changes    Diabetes mellitus without complication (HCC)    Type 2   Dysrhythmia    GERD (gastroesophageal reflux disease)    Headache    History of kidney stones    hx of   HOH (hard of hearing)    Hypercholesteremia    Hypertension    IBS (irritable bowel syndrome)    PONV (postoperative nausea and vomiting)    Shingles 15 yrs ago   HX of   Shortness of breath dyspnea    heart related   Sleep apnea    no CPAP   Spinal stenosis    Vertigo    Vitamin D deficiency    Procedure(s): 11/26/20:  Ultrasound guidance for vascular access right common femoral artery  Placement of a 10 x 8 x 30 exact stent with the use of the NAV-6 embolic protection device in the left internal carotid artery  Discharged Condition: Good  HPI / Hospital Course:  Patient is a 87 y.o. male who presents with a left hemispheric CVA and greater than 90% carotid artery stenosis.  The patient has unstable angina and not unreconstructable coronary artery disease as well as a high bifurcation and carotid artery stenting was felt to be preferred to endarterectomy for that reason.  Risks and benefits were discussed and informed consent was obtained. On 11/26/20 the patient underwent,    Ultrasound guidance for vascular access right common femoral artery  Placement of a 10 x 8 x 30 exact  stent with the use of the NAV-6 embolic protection device in the left internal carotid artery  The patient tolerated the procedure and was transferred from the angiography suite to the ICU overnight for observation.  Patient's site of surgery was unremarkable.  During his brief stay, his diet was advanced his Foley was removed and he was urinating on his own, his pain was controlled to the use of p.o. pain medication and he was ambulating at baseline.  Day of discharge she was afebrile with stable vital signs and essentially unremarkable physical exam.  Physical Exam:  Alert and oriented x3, no acute distress Face: Symmetrical, tongue midline Neck: Trachea midline Cardiovascular: Regular rate and rhythm Pulmonary: Clear to auscultation bilaterally Abdomen: Soft, nontender, nondistended Right groin: Access site, clean dry and intact.  No drainage or swelling. GU: Foley removed and patient is urinating Extremity: Distally to toes Neuro: Intact  Labs: As below  Complications: None  Consults: None  Significant Diagnostic Studies: CBC Lab Results  Component Value Date   WBC 10.3 11/27/2020   HGB 13.2 11/27/2020   HCT 39.7 11/27/2020   MCV 87.4 11/27/2020   PLT 195 11/27/2020   BMET    Component Value Date/Time   NA 137 11/27/2020 0438   NA 138 01/01/2014 1821   K 3.9 11/27/2020 0438   K 4.3 01/01/2014 1821   CL 104 11/27/2020 0438   CL  103 01/01/2014 1821   CO2 25 11/27/2020 0438   CO2 28 01/01/2014 1821   GLUCOSE 151 (H) 11/27/2020 0438   GLUCOSE 109 (H) 01/01/2014 1821   BUN 16 11/27/2020 0438   BUN 25 (H) 01/01/2014 1821   CREATININE 0.90 11/27/2020 0438   CREATININE 1.20 01/01/2014 1821   CALCIUM 8.7 (L) 11/27/2020 0438   CALCIUM 9.2 01/01/2014 1821   GFRNONAA >60 11/27/2020 0438   GFRNONAA >60 01/01/2014 1821   GFRAA >60 07/19/2016 1142   GFRAA >60 01/01/2014 1821   COAG No results found for: INR, PROTIME  Disposition:  Discharge to :Home  Allergies as of  11/27/2020       Reactions   Sulfa Antibiotics Nausea And Vomiting, Other (See Comments)   Tape Rash   adhesive   Metformin Diarrhea   High dose gives diarrhea   Sulfasalazine Nausea Only, Nausea And Vomiting   Zoster Vaccine Live Hives, Rash   Localized; Vaccine for shingles        Medication List     TAKE these medications    acetaminophen 500 MG tablet Commonly known as: TYLENOL Take 500 mg by mouth every 8 (eight) hours as needed for mild pain or moderate pain.   amiodarone 100 MG tablet Commonly known as: PACERONE Take 100 mg by mouth daily.   APPLE CIDER VINEGAR PO Take 7 g by mouth once a week.   aspirin EC 81 MG tablet Take 81 mg by mouth daily.   bisacodyl 5 MG EC tablet Commonly known as: DULCOLAX Take 5 mg by mouth daily as needed for moderate constipation.   BLACK ELDERBERRY PO Take 300 mg by mouth See admin instructions. Couple times a month   carboxymethylcellulose 0.5 % Soln Commonly known as: REFRESH PLUS Place 1 drop into both eyes daily.   Cholecalciferol 25 MCG (1000 UT) tablet Take 1,000 Units by mouth daily.   clopidogrel 75 MG tablet Commonly known as: PLAVIX Take 75 mg by mouth daily.   Cyanocobalamin 1000 MCG Caps Take 1,000 mcg by mouth daily. Gummies   dextromethorphan 30 MG/5ML liquid Commonly known as: DELSYM Take 15 mg by mouth 2 (two) times daily as needed for cough.   finasteride 5 MG tablet Commonly known as: PROSCAR Take 1 tablet (5 mg total) by mouth daily.   fluticasone 50 MCG/ACT nasal spray Commonly known as: FLONASE Place 1 spray into the nose 2 (two) times daily as needed for allergies.   glucose blood test strip   isosorbide mononitrate 60 MG 24 hr tablet Commonly known as: IMDUR Take 60 mg by mouth daily.   loperamide 2 MG tablet Commonly known as: IMODIUM A-D Take 2 mg by mouth 3 (three) times daily as needed for diarrhea or loose stools.   lovastatin 40 MG tablet Commonly known as: MEVACOR Take  40 mg by mouth at bedtime.   metFORMIN 500 MG tablet Commonly known as: GLUCOPHAGE Take 500 mg by mouth 3 (three) times daily.   metoprolol tartrate 25 MG tablet Commonly known as: LOPRESSOR Take 25 mg by mouth daily as needed (High blood pressure over 140).   multivitamin with minerals tablet Take 1 tablet by mouth daily. Centrum silver 50 +   pantoprazole 40 MG tablet Commonly known as: PROTONIX Take 40 mg by mouth daily.   Preparation H 1-0.25-14.4-15 % Crea Generic drug: Pramox-PE-Glycerin-Petrolatum Apply 1 Applicatorful topically daily as needed (Hemorrhoids).   ranolazine 500 MG 12 hr tablet Commonly known as: RANEXA Take 500 mg by  mouth 2 (two) times daily.   tamsulosin 0.4 MG Caps capsule Commonly known as: FLOMAX Take 1 capsule (0.4 mg total) by mouth daily.   torsemide 5 MG tablet Commonly known as: DEMADEX Take 5 mg by mouth daily.   triamcinolone ointment 0.1 % Commonly known as: KENALOG Apply 1 application topically daily as needed (rash).   Tums Ultra 1000 400 MG chewable tablet Generic drug: calcium elemental as carbonate Chew 1,000 mg by mouth daily as needed for heartburn.       Verbal and written Discharge instructions given to the patient. Wound care per Discharge AVS  Follow-up Information     Schnier, Dolores Lory, MD Follow up in 1 month(s).   Specialties: Vascular Surgery, Cardiology, Radiology, Vascular Surgery Why: Can see Schnier or Arna Medici. Will need carotid duplex with visit. Contact information: [place] [address] [phone]                Signed: Sela Hua, PA-C 11/27/2020, 2:57 PM

## 2020-11-27 NOTE — Progress Notes (Signed)
PHARMACIST - PHYSICIAN COMMUNICATION  CONCERNING: IV to Oral Route Change Policy  RECOMMENDATION: This patient is receiving pantoprazole by the intravenous route.  Based on criteria approved by the Pharmacy and Therapeutics Committee, the intravenous medication(s) is/are being converted to the equivalent oral dose form(s).   DESCRIPTION: These criteria include: The patient is eating (either orally or via tube) and/or has been taking other orally administered medications for a least 24 hours The patient has no evidence of active gastrointestinal bleeding or impaired GI absorption (gastrectomy, short bowel, patient on TNA or NPO).  If you have questions about this conversion, please contact the [place] 11/27/2020 11:26 AM

## 2020-11-27 NOTE — Discharge Instructions (Signed)
Vascular Surgery Discharge Instructions:  1) You may shower as of tomorrow.  Please keep your groins clean and dry.  Gently clean your groin with soap and water.  Gently pat dry. 2) Please do not engage in strenuous activity or lifting greater than 10 pounds for at least 2 weeks. 3) Please do not drive for at least 2 weeks.

## 2020-11-27 NOTE — Progress Notes (Signed)
PAD and foley removed- patient ambulated in room a few times- using personal cane.  Patient having no pain or shortness of breath. Patient now sitting in recliner-patient's personal assist at bedside.

## 2020-12-03 ENCOUNTER — Telehealth (INDEPENDENT_AMBULATORY_CARE_PROVIDER_SITE_OTHER): Payer: Self-pay

## 2020-12-03 NOTE — Telephone Encounter (Signed)
Patient called in stating that he had a carotid done on 11/26/20 and the site has swollen underneath and hard and would like to come in to be seen to get it checked out        Please call and advise

## 2020-12-03 NOTE — Telephone Encounter (Addendum)
Spoke with the patient and advised that swelling and a hard knot with bruising is normal. Patient was advised that if it became worse during the holidays to please go to the ED to be evaluated. Patient was advised of the ultrasound and office visit on 12/25/20 as well. Eulogio Ditch NP was also spoken with regarding this patient.

## 2020-12-16 ENCOUNTER — Other Ambulatory Visit: Payer: Self-pay | Admitting: Urology

## 2020-12-16 DIAGNOSIS — Z87442 Personal history of urinary calculi: Secondary | ICD-10-CM

## 2020-12-16 NOTE — Progress Notes (Signed)
12/17/2020 11:55 AM   Jared Ball 1932-05-10 [id number]  Referring provider: Kirk Ruths, MD [place] [facility],   [id number]  Chief Complaint  Patient presents with   Benign Prostatic Hypertrophy     Urological history 1. BPH with LU TS -aged out of prostate cancer screening  - I PSS 17/3  2. Nephrolithiasis -left URS in 08/2014 for 2 non obstructing stones, measuring up to 11 mm -KUB 2017 - no stones   HPI: Jared Ball is a 87 y.o. male who presents today for yearly follow up with niece, Jared Ball.    He is leaking urine and wears depends.  He has issues with his bowels as well.  He has a weak urinary stream.  Patient denies any modifying or aggravating factors.  Patient denies any gross hematuria, dysuria or suprapubic/flank pain.  Patient denies any fevers, chills, nausea or vomiting.      IPSS     Row Name 12/17/20 1100         International Prostate Symptom Score   How often have you had the sensation of not emptying your bladder? About half the time     How often have you had to urinate less than every two hours? Less than 1 in 5 times     How often have you found you stopped and started again several times when you urinated? About half the time     How often have you found it difficult to postpone urination? About half the time     How often have you had a weak urinary stream? About half the time     How often have you had to strain to start urination? Less than half the time     How many times did you typically get up at night to urinate? 2 Times     Total IPSS Score 17       Quality of Life due to urinary symptoms   If you were to spend the rest of your life with your urinary condition just the way it is now how would you feel about that? Mixed               Score:  1-7 Mild 8-19 Moderate 20-35 Severe       PMH: Past Medical History:  Diagnosis Date   Anxiety    Arthritis    Atrial  fibrillation (HCC)    BPH (benign prostatic hyperplasia)    Bronchitis    Chronic kidney disease    Cognitive changes    Diabetes mellitus without complication (HCC)    Type 2   Dysrhythmia    GERD (gastroesophageal reflux disease)    Headache    History of kidney stones    hx of   HOH (hard of hearing)    Hypercholesteremia    Hypertension    IBS (irritable bowel syndrome)    PONV (postoperative nausea and vomiting)    Shingles 15 yrs ago   HX of   Shortness of breath dyspnea    heart related   Sleep apnea    no CPAP   Spinal stenosis    Vertigo    Vitamin D deficiency     Surgical History: Past Surgical History:  Procedure Laterality Date   CAROTID PTA/STENT INTERVENTION Left 11/26/2020   Procedure: CAROTID PTA/STENT INTERVENTION;  Surgeon: Katha Cabal, MD;  Location: [place] [facility];  Service: Cardiovascular;  Laterality: Left;  CATARACT EXTRACTION W/PHACO Left 05/17/2019   Procedure: CATARACT EXTRACTION PHACO AND INTRAOCULAR LENS PLACEMENT ([place]) LEFT DIABETIC MALYUGIN;  Surgeon: Leandrew Koyanagi, MD;  Location: [place];  Service: Ophthalmology;  Laterality: Left;  16.54 1:31.1 18.2%   CATARACT EXTRACTION W/PHACO Right 07/12/2019   Procedure: CATARACT EXTRACTION PHACO AND INTRAOCULAR LENS PLACEMENT ([facility]) RIGHT MALYUGIN DIABETIC 14.90 01:37.8 15.3%;  Surgeon: Leandrew Koyanagi, MD;  Location: [place];  Service: Ophthalmology;  Laterality: Right;  Diabetic - oral meds Latex   COLONOSCOPY     CYSTOSCOPY W/ RETROGRADES Bilateral 07/02/2014   Procedure: CYSTOSCOPY WITH RETROGRADE PYELOGRAM;  Surgeon: Hollice Espy, MD;  Location: [facility];  Service: Urology;  Laterality: Bilateral;   CYSTOSCOPY WITH STENT PLACEMENT Left 07/02/2014   Procedure: CYSTOSCOPY WITH STENT PLACEMENT;  Surgeon: Hollice Espy, MD;  Location: [facility];  Service: Urology;  Laterality: Left;   DENTAL SURGERY     in the [place] CATH AND CORONARY  ANGIOGRAPHY Left 11/09/2019   Procedure: LEFT HEART CATH AND CORONARY ANGIOGRAPHY with Radial Approach;  Surgeon: Yolonda Kida, MD;  Location: [place] [facility];  Service: Cardiovascular;  Laterality: Left;   TONSILLECTOMY     and adenoids   URETEROSCOPY WITH HOLMIUM LASER LITHOTRIPSY Left 07/02/2014   Procedure: URETEROSCOPY WITH HOLMIUM LASER LITHOTRIPSY;  Surgeon: Hollice Espy, MD;  Location: [facility];  Service: Urology;  Laterality: Left;    Home Medications:  Allergies as of 12/17/2020       Reactions   Sulfa Antibiotics Nausea And Vomiting, Other (See Comments)   Tape Rash   adhesive   Metformin Diarrhea   High dose gives diarrhea   Sulfasalazine Nausea Only, Nausea And Vomiting   Zoster Vaccine Live Hives, Rash   Localized; Vaccine for shingles        Medication List        Accurate as of December 17, 2020 11:59 PM. If you have any questions, ask your nurse or doctor.          acetaminophen 500 MG tablet Commonly known as: TYLENOL Take 500 mg by mouth every 8 (eight) hours as needed for mild pain or moderate pain.   amiodarone 100 MG tablet Commonly known as: PACERONE Take 100 mg by mouth daily.   APPLE CIDER VINEGAR PO Take 7 g by mouth once a week.   aspirin EC 81 MG tablet Take 81 mg by mouth daily.   bisacodyl 5 MG EC tablet Commonly known as: DULCOLAX Take 5 mg by mouth daily as needed for moderate constipation.   BLACK ELDERBERRY PO Take 300 mg by mouth See admin instructions. Couple times a month   carboxymethylcellulose 0.5 % Soln Commonly known as: REFRESH PLUS Place 1 drop into both eyes daily.   Cholecalciferol 25 MCG (1000 UT) tablet Take 1,000 Units by mouth daily.   clopidogrel 75 MG tablet Commonly known as: PLAVIX Take 75 mg by mouth daily.   Cyanocobalamin 1000 MCG Caps Take 1,000 mcg by mouth daily. Gummies   dextromethorphan 30 MG/5ML liquid Commonly known as: DELSYM Take 15 mg by mouth 2 (two) times daily as  needed for cough.   finasteride 5 MG tablet Commonly known as: PROSCAR TAKE 1 TABLET BY MOUTH EVERY DAY   fluticasone 50 MCG/ACT nasal spray Commonly known as: FLONASE Place 1 spray into the nose 2 (two) times daily as needed for allergies.   glucose blood test strip   isosorbide mononitrate 60 MG 24 hr tablet Commonly  known as: IMDUR Take 60 mg by mouth daily.   loperamide 2 MG tablet Commonly known as: IMODIUM A-D Take 2 mg by mouth 3 (three) times daily as needed for diarrhea or loose stools.   lovastatin 40 MG tablet Commonly known as: MEVACOR Take 40 mg by mouth at bedtime.   metFORMIN 500 MG tablet Commonly known as: GLUCOPHAGE Take 500 mg by mouth 3 (three) times daily.   metoprolol tartrate 25 MG tablet Commonly known as: LOPRESSOR Take 25 mg by mouth daily as needed (High blood pressure over 140).   mirabegron ER 25 MG Tb24 tablet Commonly known as: MYRBETRIQ Take 1 tablet (25 mg total) by mouth daily. Started by: Zara Council, PA-C   multivitamin with minerals tablet Take 1 tablet by mouth daily. Centrum silver 50 +   pantoprazole 40 MG tablet Commonly known as: PROTONIX Take 40 mg by mouth daily.   Preparation H 1-0.25-14.4-15 % Crea Generic drug: Pramox-PE-Glycerin-Petrolatum Apply 1 Applicatorful topically daily as needed (Hemorrhoids).   ranolazine 500 MG 12 hr tablet Commonly known as: RANEXA Take 500 mg by mouth 2 (two) times daily.   tamsulosin 0.4 MG Caps capsule Commonly known as: FLOMAX TAKE 1 CAPSULE BY MOUTH EVERY DAY   torsemide 5 MG tablet Commonly known as: DEMADEX Take 5 mg by mouth daily.   triamcinolone ointment 0.1 % Commonly known as: KENALOG Apply 1 application topically daily as needed (rash).   Tums Ultra 1000 400 MG chewable tablet Generic drug: calcium elemental as carbonate Chew 1,000 mg by mouth daily as needed for heartburn.        Allergies:  Allergies  Allergen Reactions   Sulfa Antibiotics Nausea  And Vomiting and Other (See Comments)   Tape Rash    adhesive   Metformin Diarrhea    High dose gives diarrhea    Sulfasalazine Nausea Only and Nausea And Vomiting   Zoster Vaccine Live Hives and Rash    Localized; Vaccine for shingles     Family History: Family History  Problem Relation Age of Onset   Kidney disease Mother    Prostate cancer Neg Hx    Kidney cancer Neg Hx    Bladder Cancer Neg Hx     Social History:  reports that he quit smoking about 32 years ago. His smoking use included cigarettes. He has never used smokeless tobacco. He reports current alcohol use. He reports that he does not use drugs.  ROS: For pertinent review of systems please refer to history of present illness  Physical Exam: BP 124/65   Pulse 83   Ht 5' 10"  (1.778 m)   Wt 220 lb (99.8 kg)   BMI 31.57 kg/m   Constitutional:  Well nourished. Alert and oriented, No acute distress. HEENT: [place] AT, mask in place.  Trachea midline Cardiovascular: No clubbing, cyanosis, or edema. Respiratory: Normal respiratory effort, no increased work of breathing. Neurologic: Grossly intact, no focal deficits, moving all 4 extremities. Psychiatric: Normal mood and affect.  Laboratory Data: Specimen:  Blood  Ref Range & Units 1 mo ago  Glucose 70 - 110 mg/dL 115 High    Sodium 136 - 145 mmol/L 137   Potassium 3.6 - 5.1 mmol/L 4.4   Chloride 97 - 109 mmol/L 103   Carbon Dioxide (CO2) 22.0 - 32.0 mmol/L 24.1   Urea Nitrogen (BUN) 7 - 25 mg/dL 27 High    Creatinine 0.7 - 1.3 mg/dL 1.3   Glomerular Filtration Rate (eGFR), MDRD Estimate >60 mL/min/1.73sq m  52 Low    Calcium 8.7 - 10.3 mg/dL 9.2   AST  8 - 39 U/L 14   ALT  6 - 57 U/L 9   Alk Phos (alkaline Phosphatase) 34 - 104 U/L 54   Albumin 3.5 - 4.8 g/dL 4.0   Bilirubin, Total 0.3 - 1.2 mg/dL 0.6   Protein, Total 6.1 - 7.9 g/dL 7.2   A/G Ratio 1.0 - 5.0 gm/dL 1.3   Resulting Agency  [facility]  Specimen Collected: 11/02/19  3:56 PM Last  Resulted: 11/02/19  4:28 PM  Received From: [place]  Result Received: 11/06/19  7:30    Specimen:  Blood  Ref Range & Units 1 mo ago  WBC (White Blood Cell Count) 4.1 - 10.2 10^3/uL 9.1   RBC (Red Blood Cell Count) 4.69 - 6.13 10^6/uL 5.28   Hemoglobin 14.1 - 18.1 gm/dL 15.1   Hematocrit 40.0 - 52.0 % 47.0   MCV (Mean Corpuscular Volume) 80.0 - 100.0 fl 89.0   MCH (Mean Corpuscular Hemoglobin) 27.0 - 31.2 pg 28.6   MCHC (Mean Corpuscular Hemoglobin Concentration) 32.0 - 36.0 gm/dL 32.1   Platelet Count 150 - 450 10^3/uL 188   RDW-CV (Red Cell Distribution Width) 11.6 - 14.8 % 13.6   MPV (Mean Platelet Volume) 9.4 - 12.4 fl 10.9   Neutrophils 1.50 - 7.80 10^3/uL 5.37   Lymphocytes 1.00 - 3.60 10^3/uL 2.37   Monocytes 0.00 - 1.50 10^3/uL 0.97   Eosinophils 0.00 - 0.55 10^3/uL 0.33   Basophils 0.00 - 0.09 10^3/uL 0.05   Neutrophil % 32.0 - 70.0 % 59.1   Lymphocyte % 10.0 - 50.0 % 26.0   Monocyte % 4.0 - 13.0 % 10.6   Eosinophil % 1.0 - 5.0 % 3.6   Basophil% 0.0 - 2.0 % 0.5   Immature Granulocyte % <=0.7 % 0.2   Immature Granulocyte Count <=0.06 10^3/L 0.02   Resulting Agency  [facility]  Specimen Collected: 11/02/19  3:56 PM Last Resulted: 11/02/19  4:04 PM  Received From: [facility]  Result Received: 11/06/19  7:30 AM  I have reviewed the labs.   Pertinent imaging Results for orders placed or performed during the hospital encounter of 11/26/20  MRSA Next Gen by PCR, Nasal   Specimen: Nasal Mucosa; Nasal Swab  Result Value Ref Range   MRSA by PCR Next Gen NOT DETECTED NOT DETECTED  BUN  Result Value Ref Range   BUN 25 (H) 8 - 23 mg/dL  Creatinine, serum  Result Value Ref Range   Creatinine, Ser 1.09 0.61 - 1.24 mg/dL   GFR, Estimated >60 >60 mL/min  Glucose, capillary  Result Value Ref Range   Glucose-Capillary 185 (H) 70 - 99 mg/dL  Hemoglobin A1c  Result Value Ref Range   Hgb A1c MFr Bld 7.2 (H) 4.8 - 5.6 %    Mean Plasma Glucose 159.94 mg/dL  Glucose, capillary  Result Value Ref Range   Glucose-Capillary 177 (H) 70 - 99 mg/dL  Glucose, capillary  Result Value Ref Range   Glucose-Capillary 211 (H) 70 - 99 mg/dL  CBC  Result Value Ref Range   WBC 10.3 4.0 - 10.5 K/uL   RBC 4.54 4.22 - 5.81 MIL/uL   Hemoglobin 13.2 13.0 - 17.0 g/dL   HCT 39.7 39.0 - 52.0 %   MCV 87.4 80.0 - 100.0 fL   MCH 29.1 26.0 - 34.0 pg   MCHC 33.2 30.0 -  36.0 g/dL   RDW 14.1 11.5 - 15.5 %   Platelets 195 150 - 400 K/uL   nRBC 0.0 0.0 - 0.2 %  Basic metabolic panel  Result Value Ref Range   Sodium 137 135 - 145 mmol/L   Potassium 3.9 3.5 - 5.1 mmol/L   Chloride 104 98 - 111 mmol/L   CO2 25 22 - 32 mmol/L   Glucose, Bld 151 (H) 70 - 99 mg/dL   BUN 16 8 - 23 mg/dL   Creatinine, Ser 0.90 0.61 - 1.24 mg/dL   Calcium 8.7 (L) 8.9 - 10.3 mg/dL   GFR, Estimated >60 >60 mL/min   Anion gap 8 5 - 15  Glucose, capillary  Result Value Ref Range   Glucose-Capillary 240 (H) 70 - 99 mg/dL  Glucose, capillary  Result Value Ref Range   Glucose-Capillary 173 (H) 70 - 99 mg/dL  Glucose, capillary  Result Value Ref Range   Glucose-Capillary 182 (H) 70 - 99 mg/dL  Glucose, capillary  Result Value Ref Range   Glucose-Capillary 211 (H) 70 - 99 mg/dL  POCT Activated clotting time  Result Value Ref Range   Activated Clotting Time 260 seconds  POCT Activated clotting time  Result Value Ref Range   Activated Clotting Time 208 seconds  POCT Activated clotting time  Result Value Ref Range   Activated Clotting Time 208 seconds   Assessment & Plan:    1. BPH with LUTS -UA benign -PVR < 300 cc -symptoms - leakage -continue conservative management, avoiding bladder irritants and timed voiding's -Continue tamsulosin 0.4 mg daily and finasteride 5 mg daily, dutasteride 0.5 mg daily-refills given  2. Incontinence/Nocturia -given samples of Myrbetriq 25 mg daily, #28 samples  - I explained to the patient that nocturia is often  multi-factorial and difficult to treat.  Sleeping disorders, heart conditions, peripheral vascular disease, diabetes, an enlarged prostate for men, an urethral stricture causing bladder outlet obstruction and/or certain medications can contribute to nocturia. - I have suggested that the patient avoid caffeine after noon and alcohol in the evening.  He or she may also benefit from fluid restrictions after 6:00 in the evening and voiding just prior to bedtime. - I have explained that research studies have showed that over 84% of patients with sleep apnea reported frequent nighttime urination.   With sleep apnea, oxygen decreases, carbon dioxide increases, the blood become more acidic, the heart rate drops and blood vessels in the lung constrict.  The body is then alerted that something is very wrong. The sleeper must wake enough to reopen the airway. By this time, the heart is racing and experiences a false signal of fluid overload. The heart excretes a hormone-like protein that tells the body to get rid of sodium and water, resulting in nocturia. -  I also informed the patient that a recent study noted that decreasing sodium intake to 2.3 grams daily, if they don't have issues with hyponatremia, can also reduce the number of nightly voids - The patient may benefit from a discussion with his or her primary care physician to see if he or she has risk factors for sleep apnea or other sleep disturbances and obtaining a sleep study.   3. History of nephrolithiasis:    No flank pain or passage of fragments  4. History of urinary retention:   Resolved.    5. ED -his wife has passed  Return in about 3 weeks (around 01/07/2021) for 3 weeks or so for I PSS and  PVR.  These notes generated with voice recognition software. I apologize for typographical errors.  Zara Council, PA-C  [facility] [address] [place], [place] [address] [phone]

## 2020-12-17 ENCOUNTER — Telehealth: Payer: Self-pay | Admitting: Urology

## 2020-12-17 ENCOUNTER — Encounter: Payer: Self-pay | Admitting: Urology

## 2020-12-17 ENCOUNTER — Ambulatory Visit (INDEPENDENT_AMBULATORY_CARE_PROVIDER_SITE_OTHER): Payer: Medicare HMO | Admitting: Urology

## 2020-12-17 ENCOUNTER — Other Ambulatory Visit: Payer: Self-pay

## 2020-12-17 VITALS — BP 124/65 | HR 83 | Ht 70.0 in | Wt 220.0 lb

## 2020-12-17 DIAGNOSIS — R32 Unspecified urinary incontinence: Secondary | ICD-10-CM | POA: Diagnosis not present

## 2020-12-17 DIAGNOSIS — N401 Enlarged prostate with lower urinary tract symptoms: Secondary | ICD-10-CM

## 2020-12-17 DIAGNOSIS — N138 Other obstructive and reflux uropathy: Secondary | ICD-10-CM | POA: Diagnosis not present

## 2020-12-17 DIAGNOSIS — R351 Nocturia: Secondary | ICD-10-CM

## 2020-12-17 MED ORDER — MIRABEGRON ER 25 MG PO TB24: 25.0000 mg | ORAL_TABLET | Freq: Every day | ORAL | 0 refills | Status: DC

## 2020-12-17 NOTE — Telephone Encounter (Signed)
Pt saw Larene Beach in clinic today and forgot to mention he needs refills for Finasteride and Tamsulosin sent to [facility] on [address].

## 2020-12-17 NOTE — Telephone Encounter (Signed)
Refills was already sent, Finasteride was sent yesterday and myrbetriq he was given samples.

## 2020-12-20 ENCOUNTER — Ambulatory Visit: Payer: Self-pay | Admitting: Urology

## 2020-12-24 ENCOUNTER — Other Ambulatory Visit (INDEPENDENT_AMBULATORY_CARE_PROVIDER_SITE_OTHER): Payer: Self-pay | Admitting: Vascular Surgery

## 2020-12-24 DIAGNOSIS — Z95820 Peripheral vascular angioplasty status with implants and grafts: Secondary | ICD-10-CM

## 2020-12-24 DIAGNOSIS — I6521 Occlusion and stenosis of right carotid artery: Secondary | ICD-10-CM

## 2020-12-25 ENCOUNTER — Ambulatory Visit (INDEPENDENT_AMBULATORY_CARE_PROVIDER_SITE_OTHER): Payer: Medicare HMO

## 2020-12-25 ENCOUNTER — Other Ambulatory Visit: Payer: Self-pay

## 2020-12-25 ENCOUNTER — Ambulatory Visit (INDEPENDENT_AMBULATORY_CARE_PROVIDER_SITE_OTHER): Payer: Medicare HMO | Admitting: Nurse Practitioner

## 2020-12-25 VITALS — BP 90/50 | HR 80 | Ht 70.0 in | Wt 221.0 lb

## 2020-12-25 DIAGNOSIS — I6521 Occlusion and stenosis of right carotid artery: Secondary | ICD-10-CM

## 2020-12-25 DIAGNOSIS — Z95820 Peripheral vascular angioplasty status with implants and grafts: Secondary | ICD-10-CM | POA: Diagnosis not present

## 2021-01-06 ENCOUNTER — Encounter (INDEPENDENT_AMBULATORY_CARE_PROVIDER_SITE_OTHER): Payer: Self-pay | Admitting: Nurse Practitioner

## 2021-01-06 NOTE — Progress Notes (Signed)
Subjective:    Patient ID: Jared Ball, male    DOB: [birth year], 87 y.o.   MRN: [id number] Chief Complaint  Patient presents with   Follow-up    1 mo 12/27/20 will need carotid duplex with visit    Jared Ball is an 87 year old male that presents today after interventional limb 15 2022.  He underwent left ICA stent placement.  This was noted on CT following a CVA.  It was noted following intervention he had a approximate 90% stenosis  PROCEDURE: 1.  Ultrasound guidance for vascular access right common femoral artery 2.  Placement of a 10 x 8 x 30 exact stent with the use of the NAV-6 embolic protection device in the left internal carotid artery  The patient denies any issues postintervention.  Today noninvasive studies show 1 to 39% stenosis of the right ICA with a 40 to 59% stenosis of the left ICA.  The stent is open and patent.  Bilateral vertebral arteries demonstrate antegrade flow.  Normal flow hemodynamics are seen in the bilateral subclavian arteries.   Review of Systems  Neurological:  Positive for weakness.  All other systems reviewed and are negative.     Objective:   Physical Exam Vitals reviewed.  HENT:     Head: Normocephalic.  Neck:     Vascular: No carotid bruit.  Cardiovascular:     Rate and Rhythm: Normal rate.  Pulmonary:     Effort: Pulmonary effort is normal.  Skin:    General: Skin is warm and dry.  Neurological:     Mental Status: He is alert and oriented to person, place, and time.  Psychiatric:        Mood and Affect: Mood normal.        Behavior: Behavior normal.        Thought Content: Thought content normal.        Judgment: Judgment normal.    BP (!) 90/50    Pulse 80    Ht 5\' 10"  (1.778 m)    Wt 221 lb (100.2 kg)    BMI 31.71 kg/m   Past Medical History:  Diagnosis Date   Anxiety    Arthritis    Atrial fibrillation (HCC)    BPH (benign prostatic hyperplasia)    Bronchitis    Chronic kidney disease    Cognitive changes     Diabetes mellitus without complication (HCC)    Type 2   Dysrhythmia    GERD (gastroesophageal reflux disease)    Headache    History of kidney stones    hx of   HOH (hard of hearing)    Hypercholesteremia    Hypertension    IBS (irritable bowel syndrome)    PONV (postoperative nausea and vomiting)    Shingles 15 yrs ago   HX of   Shortness of breath dyspnea    heart related   Sleep apnea    no CPAP   Spinal stenosis    Vertigo    Vitamin D deficiency     Social History   Socioeconomic History   Marital status: Married    Spouse name: Not on file   Number of children: Not on file   Years of education: Not on file   Highest education level: Not on file  Occupational History   Not on file  Tobacco Use   Smoking status: Former    Types: Cigarettes    Quit date: 1990    Years since quitting: 86.0  Smokeless tobacco: Never  Substance and Sexual Activity   Alcohol use: Yes    Alcohol/week: 0.0 standard drinks    Comment: red wine occasionally   Drug use: No   Sexual activity: Not on file  Other Topics Concern   Not on file  Social History Narrative   Not on file   Social Determinants of Health   Financial Resource Strain: Not on file  Food Insecurity: Not on file  Transportation Needs: Not on file  Physical Activity: Not on file  Stress: Not on file  Social Connections: Not on file  Intimate Partner Violence: Not on file    Past Surgical History:  Procedure Laterality Date   CAROTID PTA/STENT INTERVENTION Left 11/26/2020   Procedure: CAROTID PTA/STENT INTERVENTION;  Surgeon: Katha Cabal, MD;  Location: [facility];  Service: Cardiovascular;  Laterality: Left;   CATARACT EXTRACTION W/PHACO Left 05/17/2019   Procedure: CATARACT EXTRACTION PHACO AND INTRAOCULAR LENS PLACEMENT ([place]) LEFT DIABETIC MALYUGIN;  Surgeon: Leandrew Koyanagi, MD;  Location: [facility];  Service: Ophthalmology;  Laterality: Left;  16.54 1:31.1 18.2%    CATARACT EXTRACTION W/PHACO Right 07/12/2019   Procedure: CATARACT EXTRACTION PHACO AND INTRAOCULAR LENS PLACEMENT ([facility]) RIGHT MALYUGIN DIABETIC 14.90 01:37.8 15.3%;  Surgeon: Leandrew Koyanagi, MD;  Location: [facility];  Service: Ophthalmology;  Laterality: Right;  Diabetic - oral meds Latex   COLONOSCOPY     CYSTOSCOPY W/ RETROGRADES Bilateral 07/02/2014   Procedure: CYSTOSCOPY WITH RETROGRADE PYELOGRAM;  Surgeon: Hollice Espy, MD;  Location: [facility];  Service: Urology;  Laterality: Bilateral;   CYSTOSCOPY WITH STENT PLACEMENT Left 07/02/2014   Procedure: CYSTOSCOPY WITH STENT PLACEMENT;  Surgeon: Hollice Espy, MD;  Location: [facility];  Service: Urology;  Laterality: Left;   DENTAL SURGERY     in the [place] CATH AND CORONARY ANGIOGRAPHY Left 11/09/2019   Procedure: LEFT HEART CATH AND CORONARY ANGIOGRAPHY with Radial Approach;  Surgeon: Yolonda Kida, MD;  Location: [facility];  Service: Cardiovascular;  Laterality: Left;   TONSILLECTOMY     and adenoids   URETEROSCOPY WITH HOLMIUM LASER LITHOTRIPSY Left 07/02/2014   Procedure: URETEROSCOPY WITH HOLMIUM LASER LITHOTRIPSY;  Surgeon: Hollice Espy, MD;  Location: [facility];  Service: Urology;  Laterality: Left;    Family History  Problem Relation Age of Onset   Kidney disease Mother    Prostate cancer Neg Hx    Kidney cancer Neg Hx    Bladder Cancer Neg Hx     Allergies  Allergen Reactions   Sulfa Antibiotics Nausea And Vomiting and Other (See Comments)   Tape Rash    adhesive   Metformin Diarrhea    High dose gives diarrhea    Sulfasalazine Nausea Only and Nausea And Vomiting   Zoster Vaccine Live Hives and Rash    Localized; Vaccine for shingles     CBC Latest Ref Rng & Units 11/27/2020 07/19/2016 06/27/2014  WBC 4.0 - 10.5 K/uL 10.3 7.6 9.0  Hemoglobin 13.0 - 17.0 g/dL 13.2 15.8 15.6  Hematocrit 39.0 - 52.0 % 39.7 48.2 48.0  Platelets 150 - 400 K/uL 195 199 180      CMP      Component Value Date/Time   NA 137 11/27/2020 0438   NA 138 01/01/2014 1821   K 3.9 11/27/2020 0438   K 4.3 01/01/2014 1821   CL 104 11/27/2020 0438   CL 103 01/01/2014 1821   CO2 25 11/27/2020 0438   CO2  28 01/01/2014 1821   GLUCOSE 151 (H) 11/27/2020 0438   GLUCOSE 109 (H) 01/01/2014 1821   BUN 16 11/27/2020 0438   BUN 25 (H) 01/01/2014 1821   CREATININE 0.90 11/27/2020 0438   CREATININE 1.20 01/01/2014 1821   CALCIUM 8.7 (L) 11/27/2020 0438   CALCIUM 9.2 01/01/2014 1821   GFRNONAA >60 11/27/2020 0438   GFRNONAA >60 01/01/2014 1821   GFRAA >60 07/19/2016 1142   GFRAA >60 01/01/2014 1821     No results found.     Assessment & Plan:   1. Carotid stenosis, symptomatic w/o infarct, right Recommend:  The patient is s/p successful left carotid stent placement  Continue antiplatelet therapy as prescribed Continue management of CAD, HTN and Hyperlipidemia Healthy heart diet,  encouraged exercise at least 4 times per week  Follow up in 3 months with duplex ultrasound and physical exam based on the patient's carotid intervention   Current Outpatient Medications on File Prior to Visit  Medication Sig Dispense Refill   acetaminophen (TYLENOL) 500 MG tablet Take 500 mg by mouth every 8 (eight) hours as needed for mild pain or moderate pain.      amiodarone (PACERONE) 100 MG tablet Take 100 mg by mouth daily.     APPLE CIDER VINEGAR PO Take 7 g by mouth once a week.     aspirin EC 81 MG tablet Take 81 mg by mouth daily.      bisacodyl (DULCOLAX) 5 MG EC tablet Take 5 mg by mouth daily as needed for moderate constipation.     BLACK ELDERBERRY PO Take 300 mg by mouth See admin instructions. Couple times a month     calcium elemental as carbonate (TUMS ULTRA 1000) 400 MG chewable tablet Chew 1,000 mg by mouth daily as needed for heartburn.     carboxymethylcellulose (REFRESH PLUS) 0.5 % SOLN Place 1 drop into both eyes daily.     Cholecalciferol 25 MCG (1000 UT) tablet Take  1,000 Units by mouth daily.      clopidogrel (PLAVIX) 75 MG tablet Take 75 mg by mouth daily.     Cyanocobalamin 1000 MCG CAPS Take 1,000 mcg by mouth daily. Gummies     dextromethorphan (DELSYM) 30 MG/5ML liquid Take 15 mg by mouth 2 (two) times daily as needed for cough.     finasteride (PROSCAR) 5 MG tablet TAKE 1 TABLET BY MOUTH EVERY DAY 90 tablet 3   fluticasone (FLONASE) 50 MCG/ACT nasal spray Place 1 spray into the nose 2 (two) times daily as needed for allergies.     glucose blood test strip      isosorbide mononitrate (IMDUR) 60 MG 24 hr tablet Take 60 mg by mouth daily.     loperamide (IMODIUM A-D) 2 MG tablet Take 2 mg by mouth 3 (three) times daily as needed for diarrhea or loose stools.      lovastatin (MEVACOR) 40 MG tablet Take 40 mg by mouth at bedtime.      metFORMIN (GLUCOPHAGE) 500 MG tablet Take 500 mg by mouth 3 (three) times daily.      metoprolol tartrate (LOPRESSOR) 25 MG tablet Take 25 mg by mouth daily as needed (High blood pressure over 140).     mirabegron ER (MYRBETRIQ) 25 MG TB24 tablet Take 1 tablet (25 mg total) by mouth daily. 28 tablet 0   Multiple Vitamins-Minerals (MULTIVITAMIN WITH MINERALS) tablet Take 1 tablet by mouth daily. Centrum silver 50 +     pantoprazole (PROTONIX) 40 MG tablet Take  40 mg by mouth daily.      Pramox-PE-Glycerin-Petrolatum (PREPARATION H) 1-0.25-14.4-15 % CREA Apply 1 Applicatorful topically daily as needed (Hemorrhoids).     ranolazine (RANEXA) 500 MG 12 hr tablet Take 500 mg by mouth 2 (two) times daily.      tamsulosin (FLOMAX) 0.4 MG CAPS capsule TAKE 1 CAPSULE BY MOUTH EVERY DAY 90 capsule 3   torsemide (DEMADEX) 5 MG tablet Take 5 mg by mouth daily.      triamcinolone ointment (KENALOG) 0.1 % Apply 1 application topically daily as needed (rash).     No current facility-administered medications on file prior to visit.    There are no Patient Instructions on file for this visit. No follow-ups on file.   Kris Hartmann,  NP

## 2021-01-14 ENCOUNTER — Ambulatory Visit: Payer: Medicare HMO | Admitting: Urology

## 2021-01-20 NOTE — Progress Notes (Deleted)
01/21/2021 9:19 AM   Jared Ball 10/07/1932 [id number]  Referring provider: Kirk Ruths, MD [facility] [place],  [place] [address]  No chief complaint on file.   Urological history 1. BPH with LU TS -aged out of prostate cancer screening  -I PSS *** -PVR ***  2. Nephrolithiasis -left URS in 08/2014 for 2 non obstructing stones, measuring up to 11 mm -KUB 2017 - no stones   HPI: Jared Ball is a 88 y.o. male who presents today for a three week follow up after a trial of Myrbetriq 25 mg daily for urinary leakage.  Yearly follow up with niece, Jared Ball.          Score:  1-7 Mild 8-19 Moderate 20-35 Severe       PMH: Past Medical History:  Diagnosis Date   Anxiety    Arthritis    Atrial fibrillation (HCC)    BPH (benign prostatic hyperplasia)    Bronchitis    Chronic kidney disease    Cognitive changes    Diabetes mellitus without complication (HCC)    Type 2   Dysrhythmia    GERD (gastroesophageal reflux disease)    Headache    History of kidney stones    hx of   HOH (hard of hearing)    Hypercholesteremia    Hypertension    IBS (irritable bowel syndrome)    PONV (postoperative nausea and vomiting)    Shingles 15 yrs ago   HX of   Shortness of breath dyspnea    heart related   Sleep apnea    no CPAP   Spinal stenosis    Vertigo    Vitamin D deficiency     Surgical History: Past Surgical History:  Procedure Laterality Date   CAROTID PTA/STENT INTERVENTION Left 11/26/2020   Procedure: CAROTID PTA/STENT INTERVENTION;  Surgeon: Katha Cabal, MD;  Location: [place] [facility];  Service: Cardiovascular;  Laterality: Left;   CATARACT EXTRACTION W/PHACO Left 05/17/2019   Procedure: CATARACT EXTRACTION PHACO AND INTRAOCULAR LENS PLACEMENT ([place]) LEFT DIABETIC MALYUGIN;  Surgeon: Leandrew Koyanagi, MD;  Location: [facility];  Service: Ophthalmology;  Laterality: Left;  16.54 1:31.1 18.2%    CATARACT EXTRACTION W/PHACO Right 07/12/2019   Procedure: CATARACT EXTRACTION PHACO AND INTRAOCULAR LENS PLACEMENT ([facility]) RIGHT MALYUGIN DIABETIC 14.90 01:37.8 15.3%;  Surgeon: Leandrew Koyanagi, MD;  Location: [place];  Service: Ophthalmology;  Laterality: Right;  Diabetic - oral meds Latex   COLONOSCOPY     CYSTOSCOPY W/ RETROGRADES Bilateral 07/02/2014   Procedure: CYSTOSCOPY WITH RETROGRADE PYELOGRAM;  Surgeon: Hollice Espy, MD;  Location: [facility];  Service: Urology;  Laterality: Bilateral;   CYSTOSCOPY WITH STENT PLACEMENT Left 07/02/2014   Procedure: CYSTOSCOPY WITH STENT PLACEMENT;  Surgeon: Hollice Espy, MD;  Location: [facility];  Service: Urology;  Laterality: Left;   DENTAL SURGERY     in the [place] CATH AND CORONARY ANGIOGRAPHY Left 11/09/2019   Procedure: LEFT HEART CATH AND CORONARY ANGIOGRAPHY with Radial Approach;  Surgeon: Yolonda Kida, MD;  Location: [facility];  Service: Cardiovascular;  Laterality: Left;   TONSILLECTOMY     and adenoids   URETEROSCOPY WITH HOLMIUM LASER LITHOTRIPSY Left 07/02/2014   Procedure: URETEROSCOPY WITH HOLMIUM LASER LITHOTRIPSY;  Surgeon: Hollice Espy, MD;  Location: [facility];  Service: Urology;  Laterality: Left;    Home Medications:  Allergies as of 01/21/2021       Reactions  Sulfa Antibiotics Nausea And Vomiting, Other (See Comments)   Tape Rash   adhesive   Metformin Diarrhea   High dose gives diarrhea   Sulfasalazine Nausea Only, Nausea And Vomiting   Zoster Vaccine Live Hives, Rash   Localized; Vaccine for shingles        Medication List        Accurate as of January 20, 2021  9:19 AM. If you have any questions, ask your nurse or doctor.          acetaminophen 500 MG tablet Commonly known as: TYLENOL Take 500 mg by mouth every 8 (eight) hours as needed for mild pain or moderate pain.   amiodarone 100 MG tablet Commonly known as: PACERONE Take 100 mg by mouth daily.    APPLE CIDER VINEGAR PO Take 7 g by mouth once a week.   aspirin EC 81 MG tablet Take 81 mg by mouth daily.   bisacodyl 5 MG EC tablet Commonly known as: DULCOLAX Take 5 mg by mouth daily as needed for moderate constipation.   BLACK ELDERBERRY PO Take 300 mg by mouth See admin instructions. Couple times a month   carboxymethylcellulose 0.5 % Soln Commonly known as: REFRESH PLUS Place 1 drop into both eyes daily.   Cholecalciferol 25 MCG (1000 UT) tablet Take 1,000 Units by mouth daily.   clopidogrel 75 MG tablet Commonly known as: PLAVIX Take 75 mg by mouth daily.   Cyanocobalamin 1000 MCG Caps Take 1,000 mcg by mouth daily. Gummies   dextromethorphan 30 MG/5ML liquid Commonly known as: DELSYM Take 15 mg by mouth 2 (two) times daily as needed for cough.   finasteride 5 MG tablet Commonly known as: PROSCAR TAKE 1 TABLET BY MOUTH EVERY DAY   fluticasone 50 MCG/ACT nasal spray Commonly known as: FLONASE Place 1 spray into the nose 2 (two) times daily as needed for allergies.   glucose blood test strip   isosorbide mononitrate 60 MG 24 hr tablet Commonly known as: IMDUR Take 60 mg by mouth daily.   loperamide 2 MG tablet Commonly known as: IMODIUM A-D Take 2 mg by mouth 3 (three) times daily as needed for diarrhea or loose stools.   lovastatin 40 MG tablet Commonly known as: MEVACOR Take 40 mg by mouth at bedtime.   metFORMIN 500 MG tablet Commonly known as: GLUCOPHAGE Take 500 mg by mouth 3 (three) times daily.   metoprolol tartrate 25 MG tablet Commonly known as: LOPRESSOR Take 25 mg by mouth daily as needed (High blood pressure over 140).   mirabegron ER 25 MG Tb24 tablet Commonly known as: MYRBETRIQ Take 1 tablet (25 mg total) by mouth daily.   multivitamin with minerals tablet Take 1 tablet by mouth daily. Centrum silver 50 +   pantoprazole 40 MG tablet Commonly known as: PROTONIX Take 40 mg by mouth daily.   Preparation H 1-0.25-14.4-15 %  Crea Generic drug: Pramox-PE-Glycerin-Petrolatum Apply 1 Applicatorful topically daily as needed (Hemorrhoids).   ranolazine 500 MG 12 hr tablet Commonly known as: RANEXA Take 500 mg by mouth 2 (two) times daily.   tamsulosin 0.4 MG Caps capsule Commonly known as: FLOMAX TAKE 1 CAPSULE BY MOUTH EVERY DAY   torsemide 5 MG tablet Commonly known as: DEMADEX Take 5 mg by mouth daily.   triamcinolone ointment 0.1 % Commonly known as: KENALOG Apply 1 application topically daily as needed (rash).   Tums Ultra 1000 400 MG chewable tablet Generic drug: calcium elemental as carbonate Chew 1,000 mg by  mouth daily as needed for heartburn.        Allergies:  Allergies  Allergen Reactions   Sulfa Antibiotics Nausea And Vomiting and Other (See Comments)   Tape Rash    adhesive   Metformin Diarrhea    High dose gives diarrhea    Sulfasalazine Nausea Only and Nausea And Vomiting   Zoster Vaccine Live Hives and Rash    Localized; Vaccine for shingles     Family History: Family History  Problem Relation Age of Onset   Kidney disease Mother    Prostate cancer Neg Hx    Kidney cancer Neg Hx    Bladder Cancer Neg Hx     Social History:  reports that he quit smoking about 33 years ago. His smoking use included cigarettes. He has never used smokeless tobacco. He reports current alcohol use. He reports that he does not use drugs.  ROS: For pertinent review of systems please refer to history of present illness  Physical Exam: There were no vitals taken for this visit.  Constitutional:  Well nourished. Alert and oriented, No acute distress. HEENT: [place] AT, moist mucus membranes.  Trachea midline Cardiovascular: No clubbing, cyanosis, or edema. Respiratory: Normal respiratory effort, no increased work of breathing. GU: No CVA tenderness.  No bladder fullness or masses.  Patient with circumcised/uncircumcised phallus. ***Foreskin easily retracted***  Urethral meatus is patent.  No  penile discharge. No penile lesions or rashes. Scrotum without lesions, cysts, rashes and/or edema.  Testicles are located scrotally bilaterally. No masses are appreciated in the testicles. Left and right epididymis are normal. Rectal: Patient with  normal sphincter tone. Anus and perineum without scarring or rashes. No rectal masses are appreciated. Prostate is approximately *** grams, *** nodules are appreciated. Seminal vesicles are normal. Neurologic: Grossly intact, no focal deficits, moving all 4 extremities. Psychiatric: Normal mood and affect.   Laboratory Data: Glucose 70 - 110 mg/dL 145 High    Sodium 136 - 145 mmol/L 137   Potassium 3.6 - 5.1 mmol/L 4.3   Chloride 97 - 109 mmol/L 102   Carbon Dioxide (CO2) 22.0 - 32.0 mmol/L 28.0   Calcium 8.7 - 10.3 mg/dL 9.4   Urea Nitrogen (BUN) 7 - 25 mg/dL 29 High    Creatinine 0.7 - 1.3 mg/dL 1.1   Glomerular Filtration Rate (eGFR), MDRD Estimate >60 mL/min/1.73sq m 63   BUN/Crea Ratio 6.0 - 20.0 26.4 High    Anion Gap w/K 6.0 - 16.0 11.3   Resulting Agency  [facility] - [facility]  Specimen Collected: 12/26/20 09:56 Last Resulted: 12/26/20 12:23  Received From: [facility]  Result Received: 12/27/20 08:53   Hemoglobin A1C 4.2 - 5.6 % 8.1 High    Average Blood Glucose (Calc) mg/dL [facility] - [facility]  Narrative Performed by [facility] - [facility] Normal Range:    4.2 - 5.6%  Increased Risk:  5.7 - 6.4%  Diabetes:        >= 6.5%  Glycemic Control for adults with diabetes:  <7%   Specimen Collected: 12/26/20 09:56 Last Resulted: 12/26/20 11:03  Received From: [facility]  Result Received: 12/27/20 08:53   Protein, Total 6.1 - 7.9 g/dL 7.0   Albumin 3.5 - 4.8 g/dL 4.2   Bilirubin, Total 0.3 - 1.2 mg/dL 0.7   Bilirubin, Conjugated 0.00 - 0.20 mg/dL 0.14   Alk Phos (alkaline Phosphatase) 34 - 104 U/L 56   AST  8 - 39 U/L 18   ALT  6 - 57 U/L 15   Resulting  Agency  [facility] - [facility]  Specimen Collected: 12/26/20 09:56 Last Resulted: 12/26/20 12:24  Received From: [facility]  Result Received: 12/27/20 08:53   Cholesterol, Total 100 - 200 mg/dL 184   Triglyceride 35 - 199 mg/dL 155   HDL (High Density Lipoprotein) Cholesterol 29.0 - 71.0 mg/dL 57.2   LDL Calculated 0 - 130 mg/dL 96   VLDL Cholesterol mg/dL 31   Cholesterol/HDL Ratio  3.2   Resulting Agency  [facility] - [facility]  Specimen Collected: 12/26/20 09:56 Last Resulted: 12/26/20 12:24  Received From: [facility]  Result Received: 12/27/20 08:53  I have reviewed the labs.   Pertinent Imaging ***    Assessment & Plan:    1. BPH with LUTS -UA benign -PVR < 300 cc -symptoms - leakage -continue conservative management, avoiding bladder irritants and timed voiding's -Continue tamsulosin 0.4 mg daily and finasteride 5 mg daily-refills given  2. Incontinence/Nocturia -given samples of Myrbetriq 25 mg daily, #28 samples  - I explained to the patient that nocturia is often multi-factorial and difficult to treat.  Sleeping disorders, heart conditions, peripheral vascular disease, diabetes, an enlarged prostate for men, an urethral stricture causing bladder outlet obstruction and/or certain medications can contribute to nocturia. - I have suggested that the patient avoid caffeine after noon and alcohol in the evening.  He or she may also benefit from fluid restrictions after 6:00 in the evening and voiding just prior to bedtime. - I have explained that research studies have showed that over 84% of patients with sleep apnea reported frequent nighttime urination.   With sleep apnea, oxygen decreases, carbon dioxide increases, the blood become more acidic, the heart rate drops and blood vessels in the lung constrict.  The body is then alerted that something is very wrong. The sleeper must wake enough to reopen the airway. By this time, the  heart is racing and experiences a false signal of fluid overload. The heart excretes a hormone-like protein that tells the body to get rid of sodium and water, resulting in nocturia. -  I also informed the patient that a recent study noted that decreasing sodium intake to 2.3 grams daily, if they don't have issues with hyponatremia, can also reduce the number of nightly voids - The patient may benefit from a discussion with his or her primary care physician to see if he or she has risk factors for sleep apnea or other sleep disturbances and obtaining a sleep study.   3. History of nephrolithiasis:    No flank pain or passage of fragments  4. History of urinary retention:   Resolved.    5. ED -his wife has passed  No follow-ups on file.  These notes generated with voice recognition software. I apologize for typographical errors.  Zara Council, PA-C  [facility] [address] [place], [place] [address] [phone]

## 2021-01-21 ENCOUNTER — Ambulatory Visit: Payer: Medicare HMO | Admitting: Urology

## 2021-01-21 DIAGNOSIS — N138 Other obstructive and reflux uropathy: Secondary | ICD-10-CM

## 2021-01-21 DIAGNOSIS — R351 Nocturia: Secondary | ICD-10-CM

## 2021-03-24 ENCOUNTER — Ambulatory Visit (INDEPENDENT_AMBULATORY_CARE_PROVIDER_SITE_OTHER): Payer: Medicare HMO | Admitting: Vascular Surgery

## 2021-03-24 ENCOUNTER — Other Ambulatory Visit: Payer: Self-pay

## 2021-03-24 ENCOUNTER — Ambulatory Visit (INDEPENDENT_AMBULATORY_CARE_PROVIDER_SITE_OTHER): Payer: Medicare HMO

## 2021-03-24 ENCOUNTER — Other Ambulatory Visit (INDEPENDENT_AMBULATORY_CARE_PROVIDER_SITE_OTHER): Payer: Self-pay | Admitting: Nurse Practitioner

## 2021-03-24 ENCOUNTER — Encounter (INDEPENDENT_AMBULATORY_CARE_PROVIDER_SITE_OTHER): Payer: Self-pay | Admitting: Vascular Surgery

## 2021-03-24 VITALS — BP 123/67 | HR 71 | Resp 16 | Ht 70.0 in | Wt 218.0 lb

## 2021-03-24 DIAGNOSIS — I872 Venous insufficiency (chronic) (peripheral): Secondary | ICD-10-CM

## 2021-03-24 DIAGNOSIS — I10 Essential (primary) hypertension: Secondary | ICD-10-CM | POA: Diagnosis not present

## 2021-03-24 DIAGNOSIS — I482 Chronic atrial fibrillation, unspecified: Secondary | ICD-10-CM

## 2021-03-24 DIAGNOSIS — I6523 Occlusion and stenosis of bilateral carotid arteries: Secondary | ICD-10-CM

## 2021-03-24 DIAGNOSIS — I6529 Occlusion and stenosis of unspecified carotid artery: Secondary | ICD-10-CM

## 2021-03-24 DIAGNOSIS — E119 Type 2 diabetes mellitus without complications: Secondary | ICD-10-CM

## 2021-03-24 DIAGNOSIS — E7800 Pure hypercholesterolemia, unspecified: Secondary | ICD-10-CM

## 2021-03-24 NOTE — Progress Notes (Signed)
MRN : [id number]  Jared Ball is a 88 y.o. (04/19/1932) male who presents with chief complaint of check legs.  History of Present Illness:  The patient is seen for follow up evaluation of carotid stenosis status post left carotid stent on 11/26/2020.    Procedure:  Placement of a 10 x 8 x 30 exact stent with the use of the NAV-6 embolic protection device in the left internal carotid artery  There were no post operative problems or complications related to the surgery.  The patient denies neck or incisional pain.  The patient denies interval amaurosis fugax. There is no recent history of TIA symptoms or focal motor deficits. There is no prior documented CVA.  The patient denies headache.  The patient is taking enteric-coated aspirin 81 mg daily.  The patient has a history of coronary artery disease, no recent episodes of angina or shortness of breath. The patient denies PAD or claudication symptoms. There is a history of hyperlipidemia which is being treated with a statin.    Duplex ultrasound of the carotid arteries RICA 1-39% and Left 40-59% s/p stent.  No change compared toe study on 112/14/2022.   No outpatient medications have been marked as taking for the 03/24/21 encounter (Appointment) with Delana Meyer, Dolores Lory, MD.    Past Medical History:  Diagnosis Date   Anxiety    Arthritis    Atrial fibrillation ([place])    BPH (benign prostatic hyperplasia)    Bronchitis    Chronic kidney disease    Cognitive changes    Diabetes mellitus without complication ([place])    Type 2   Dysrhythmia    GERD (gastroesophageal reflux disease)    Headache    History of kidney stones    hx of   HOH (hard of hearing)    Hypercholesteremia    Hypertension    IBS (irritable bowel syndrome)    PONV (postoperative nausea and vomiting)    Shingles 15 yrs ago   HX of   Shortness of breath dyspnea    heart related   Sleep apnea    no CPAP   Spinal stenosis    Vertigo    Vitamin D  deficiency     Past Surgical History:  Procedure Laterality Date   CAROTID PTA/STENT INTERVENTION Left 11/26/2020   Procedure: CAROTID PTA/STENT INTERVENTION;  Surgeon: Katha Cabal, MD;  Location: [facility];  Service: Cardiovascular;  Laterality: Left;   CATARACT EXTRACTION W/PHACO Left 05/17/2019   Procedure: CATARACT EXTRACTION PHACO AND INTRAOCULAR LENS PLACEMENT ([place]) LEFT DIABETIC MALYUGIN;  Surgeon: Leandrew Koyanagi, MD;  Location: [facility];  Service: Ophthalmology;  Laterality: Left;  16.54 1:31.1 18.2%   CATARACT EXTRACTION W/PHACO Right 07/12/2019   Procedure: CATARACT EXTRACTION PHACO AND INTRAOCULAR LENS PLACEMENT ([facility]) RIGHT MALYUGIN DIABETIC 14.90 01:37.8 15.3%;  Surgeon: Leandrew Koyanagi, MD;  Location: [facility];  Service: Ophthalmology;  Laterality: Right;  Diabetic - oral meds Latex   COLONOSCOPY     CYSTOSCOPY W/ RETROGRADES Bilateral 07/02/2014   Procedure: CYSTOSCOPY WITH RETROGRADE PYELOGRAM;  Surgeon: Hollice Espy, MD;  Location: [facility];  Service: Urology;  Laterality: Bilateral;   CYSTOSCOPY WITH STENT PLACEMENT Left 07/02/2014   Procedure: CYSTOSCOPY WITH STENT PLACEMENT;  Surgeon: Hollice Espy, MD;  Location: [facility];  Service: Urology;  Laterality: Left;   DENTAL SURGERY     in the [place] CATH AND CORONARY ANGIOGRAPHY Left 11/09/2019   Procedure: LEFT HEART CATH AND CORONARY  ANGIOGRAPHY with Radial Approach;  Surgeon: Yolonda Kida, MD;  Location: [facility];  Service: Cardiovascular;  Laterality: Left;   TONSILLECTOMY     and adenoids   URETEROSCOPY WITH HOLMIUM LASER LITHOTRIPSY Left 07/02/2014   Procedure: URETEROSCOPY WITH HOLMIUM LASER LITHOTRIPSY;  Surgeon: Hollice Espy, MD;  Location: [facility];  Service: Urology;  Laterality: Left;    Social History Social History   Tobacco Use   Smoking status: Former    Types: Cigarettes    Quit date: 1990    Years since quitting:  33.2   Smokeless tobacco: Never  Substance Use Topics   Alcohol use: Yes    Alcohol/week: 0.0 standard drinks    Comment: red wine occasionally   Drug use: No    Family History Family History  Problem Relation Age of Onset   Kidney disease Mother    Prostate cancer Neg Hx    Kidney cancer Neg Hx    Bladder Cancer Neg Hx     Allergies  Allergen Reactions   Sulfa Antibiotics Nausea And Vomiting and Other (See Comments)   Tape Rash    adhesive   Metformin Diarrhea    High dose gives diarrhea    Sulfasalazine Nausea Only and Nausea And Vomiting   Zoster Vaccine Live Hives and Rash    Localized; Vaccine for shingles      REVIEW OF SYSTEMS (Negative unless checked)  Constitutional: '[]'$ Weight loss  '[]'$ Fever  '[]'$ Chills Cardiac: '[]'$ Chest pain   '[]'$ Chest pressure   '[]'$ Palpitations   '[]'$ Shortness of breath when laying flat   '[]'$ Shortness of breath with exertion. Vascular:  '[]'$ Pain in legs with walking   '[]'$ Pain in legs at rest  '[]'$ History of DVT   '[]'$ Phlebitis   '[]'$ Swelling in legs   '[]'$ Varicose veins   '[]'$ Non-healing ulcers Pulmonary:   '[]'$ Uses home oxygen   '[]'$ Productive cough   '[]'$ Hemoptysis   '[]'$ Wheeze  '[]'$ COPD   '[]'$ Asthma Neurologic:  '[]'$ Dizziness   '[]'$ Seizures   '[]'$ History of stroke   '[x]'$ History of TIA  '[]'$ Aphasia   '[]'$ Vissual changes   '[]'$ Weakness or numbness in arm   '[]'$ Weakness or numbness in leg Musculoskeletal:   '[]'$ Joint swelling   '[]'$ Joint pain   '[]'$ Low back pain Hematologic:  '[]'$ Easy bruising  '[]'$ Easy bleeding   '[]'$ Hypercoagulable state   '[]'$ Anemic Gastrointestinal:  '[]'$ Diarrhea   '[]'$ Vomiting  '[]'$ Gastroesophageal reflux/heartburn   '[]'$ Difficulty swallowing. Genitourinary:  '[]'$ Chronic kidney disease   '[]'$ Difficult urination  '[]'$ Frequent urination   '[]'$ Blood in urine Skin:  '[]'$ Rashes   '[]'$ Ulcers  Psychological:  '[]'$ History of anxiety   '[]'$  History of major depression.  Physical Examination  There were no vitals filed for this visit. There is no height or weight on file to calculate BMI. Gen: WD/WN, NAD Head:  [place]/AT, No temporalis wasting.  Ear/Nose/Throat: Hearing grossly intact, nares w/o erythema or drainage Eyes: PER, EOMI, sclera nonicteric.  Neck: Supple, no masses.  No bruit or JVD.  Pulmonary:  Good air movement, no audible wheezing, no use of accessory muscles.  Cardiac: RRR, normal S1, S2, no Murmurs. Vascular:  no carotid bruits Vessel Right Left  Radial Palpable Palpable  Carotid Palpable Palpable  Gastrointestinal: soft, non-distended. No guarding/no peritoneal signs.  Musculoskeletal: M/S 5/5 throughout.  No visible deformity.  Neurologic: CN 2-12 intact. Pain and light touch intact in extremities.  Symmetrical.  Speech is fluent. Motor exam as listed above. Psychiatric: Judgment intact, Mood & affect appropriate for pt's clinical situation. Dermatologic: No rashes or ulcers noted.  No changes  consistent with cellulitis.   CBC Lab Results  Component Value Date   WBC 10.3 11/27/2020   HGB 13.2 11/27/2020   HCT 39.7 11/27/2020   MCV 87.4 11/27/2020   PLT 195 11/27/2020    BMET    Component Value Date/Time   NA 137 11/27/2020 0438   NA 138 01/01/2014 1821   K 3.9 11/27/2020 0438   K 4.3 01/01/2014 1821   CL 104 11/27/2020 0438   CL 103 01/01/2014 1821   CO2 25 11/27/2020 0438   CO2 28 01/01/2014 1821   GLUCOSE 151 (H) 11/27/2020 0438   GLUCOSE 109 (H) 01/01/2014 1821   BUN 16 11/27/2020 0438   BUN 25 (H) 01/01/2014 1821   CREATININE 0.90 11/27/2020 0438   CREATININE 1.20 01/01/2014 1821   CALCIUM 8.7 (L) 11/27/2020 0438   CALCIUM 9.2 01/01/2014 1821   GFRNONAA >60 11/27/2020 0438   GFRNONAA >60 01/01/2014 1821   GFRAA >60 07/19/2016 1142   GFRAA >60 01/01/2014 1821   CrCl cannot be calculated (Patient's most recent lab result is older than the maximum 21 days allowed.).  COAG No results found for: INR, PROTIME  Radiology No results found.   Assessment/Plan 1. Bilateral carotid artery stenosis Recommend:  The patient is s/p successful left carotid  stent 11/26/2020.  Duplex ultrasound of the carotid arteries RICA 1-39% and Left 40-59% s/p stent.  No change compared toe study on 112/14/2022.  Continue antiplatelet therapy as prescribed Continue management of CAD, HTN and Hyperlipidemia Healthy heart diet,  encouraged exercise at least 4 times per week  Follow up in 3 months with duplex ultrasound and physical exam based on the patient's carotid stent and <50% stenosis of the right carotid artery   - VAS US CAROTID  2. Symptomatic carotid artery stenosis without infarction, unspecified laterality Recommend:  The patient is s/p successful left carotid stent 11/26/2020.  Duplex ultrasound of the carotid arteries RICA 1-39% and Left 40-59% s/p stent.  No change compared toe study on 112/14/2022.  Continue antiplatelet therapy as prescribed Continue management of CAD, HTN and Hyperlipidemia Healthy heart diet,  encouraged exercise at least 4 times per week  Follow up in 3 months with duplex ultrasound and physical exam based on the patient's carotid stent and <50% stenosis of the right carotid artery    3. Chronic venous insufficiency No surgery or intervention at this point in time.    I have reviewed my discussion with the patient regarding venous insufficiency and secondary lymph edema and why it  causes symptoms. I have discussed with the patient the chronic skin changes that accompany these problems and the long term sequela such as ulceration and infection.  Patient will continue wearing graduated compression stockings class 1 (20-30 mmHg) on a daily basis a prescription was given to the patient to keep this updated. The patient will  put the stockings on first thing in the morning and removing them in the evening. The patient is instructed specifically not to sleep in the stockings.  In addition, behavioral modification including elevation during the day will be continued.  Diet and salt restriction was also discussed.  Previous  duplex ultrasound of the lower extremities shows normal deep venous system, superficial reflux was not present.   Following the review of the ultrasound the patient will follow up in 12 months to reassess the degree of swelling and the control that graduated compression is offering.   The patient can be assessed for a Lymph Pump at that  time.  However, at this time the patient states they are satisfied with the control compression and elevation is yielding.    4. HTN (hypertension), benign Continue antihypertensive medications as already ordered, these medications have been reviewed and there are no changes at this time.   5. Chronic atrial fibrillation (HCC) Continue antiarrhythmia medications as already ordered, these medications have been reviewed and there are no changes at this time.  Continue anticoagulation as ordered by Cardiology Service   6. Type 2 diabetes mellitus without complication, unspecified whether long term insulin use (HCC) Continue hypoglycemic medications as already ordered, these medications have been reviewed and there are no changes at this time.  Hgb A1C to be monitored as already arranged by primary service   7. Hypercholesteremia Continue statin as ordered and reviewed, no changes at this time    Hortencia Pilar, MD  03/24/2021 1:19 PM

## 2021-05-06 ENCOUNTER — Encounter: Payer: Self-pay | Admitting: Urology

## 2021-05-06 ENCOUNTER — Ambulatory Visit (INDEPENDENT_AMBULATORY_CARE_PROVIDER_SITE_OTHER): Payer: Medicare HMO | Admitting: Urology

## 2021-05-06 VITALS — BP 134/68 | HR 83 | Ht 68.0 in | Wt 217.0 lb

## 2021-05-06 DIAGNOSIS — R238 Other skin changes: Secondary | ICD-10-CM

## 2021-05-06 DIAGNOSIS — N401 Enlarged prostate with lower urinary tract symptoms: Secondary | ICD-10-CM

## 2021-05-06 DIAGNOSIS — R351 Nocturia: Secondary | ICD-10-CM

## 2021-05-06 DIAGNOSIS — R32 Unspecified urinary incontinence: Secondary | ICD-10-CM

## 2021-05-06 DIAGNOSIS — N476 Balanoposthitis: Secondary | ICD-10-CM | POA: Diagnosis not present

## 2021-05-06 MED ORDER — NYSTATIN-TRIAMCINOLONE 100000-0.1 UNIT/GM-% EX OINT: 1.0000 "application " | TOPICAL_OINTMENT | Freq: Two times a day (BID) | CUTANEOUS | 3 refills | Status: DC

## 2021-05-06 NOTE — Progress Notes (Signed)
? ? ?05/06/2021 ?10:26 AM  ? ?Jared Ball ?Dec 10, 1932 ?[id number] ? ?Referring provider: Kirk Ruths, MD ?[place],  [place] [address] ? ?Chief Complaint  ?Patient presents with  ? Balanoposthitis  ?  ?Urological history ?1. BPH with LU TS ?-aged out of prostate cancer screening  ?-Managed with tamsulosin 0.4 mg daily and finasteride 5 mg daily ? ?2. Nephrolithiasis ?-left URS in 08/2014 for 2 non obstructing stones, measuring up to 11 mm ?-KUB 2017 - no stones  ? ?3.  Incontinence ?-Contributing factors of age, arthritis, anxiety, BPH, hypertension, untreated sleep apnea, diabetes, obesity, spinal stenosis of lumbar spine, lymphedema and diuretics ? ?4. Nocturia ?-Risk factors for nocturia: obstructive sleep apnea, hypertension, diabetes, arthritis, heart disease, anxiety and BPH.    ? ? ?HPI: ?Jared Ball is a 88 y.o. male who presents today for red, itching, burning at the head of his penis with his nurse aid, Amy.   ? ?He states that the tip of his penis looks like a second degree sunburn over the last two weeks.  He applying Vaseline to his penis which has helped a little.  He wears depends for urinary incontinence.   ? ?He is also having some "boils" in his buttock areas. ? ?Patient denies any modifying or aggravating factors.  Patient denies any gross hematuria, dysuria or suprapubic/flank pain.  Patient denies any fevers, chills, nausea or vomiting.   ?  ? ?PMH: ?Past Medical History:  ?Diagnosis Date  ? Anxiety   ? Arthritis   ? Atrial fibrillation ([place])   ? BPH (benign prostatic hyperplasia)   ? Bronchitis   ? Chronic kidney disease   ? Cognitive changes   ? Diabetes mellitus without complication ([place])   ? Type 2  ? Dysrhythmia   ? GERD (gastroesophageal reflux disease)   ? Headache   ? History of kidney stones   ? hx of  ? HOH (hard of hearing)   ? Hypercholesteremia   ? Hypertension   ? IBS (irritable bowel syndrome)   ? PONV (postoperative nausea and  vomiting)   ? Shingles 15 yrs ago  ? HX of  ? Shortness of breath dyspnea   ? heart related  ? Sleep apnea   ? no CPAP  ? Spinal stenosis   ? Vertigo   ? Vitamin D deficiency   ? ? ?Surgical History: ?Past Surgical History:  ?Procedure Laterality Date  ? CAROTID PTA/STENT INTERVENTION Left 11/26/2020  ? Procedure: CAROTID PTA/STENT INTERVENTION;  Surgeon: Katha Cabal, MD;  Location: [facility];  Service: Cardiovascular;  Laterality: Left;  ? CATARACT EXTRACTION W/PHACO Left 05/17/2019  ? Procedure: CATARACT EXTRACTION PHACO AND INTRAOCULAR LENS PLACEMENT ([place]) LEFT DIABETIC MALYUGIN;  Surgeon: Leandrew Koyanagi, MD;  Location: [facility];  Service: Ophthalmology;  Laterality: Left;  16.54 ?1:31.1 ?18.2%  ? CATARACT EXTRACTION W/PHACO Right 07/12/2019  ? Procedure: CATARACT EXTRACTION PHACO AND INTRAOCULAR LENS PLACEMENT ([facility]) RIGHT MALYUGIN DIABETIC 14.90 01:37.8 15.3%;  Surgeon: Leandrew Koyanagi, MD;  Location: [facility];  Service: Ophthalmology;  Laterality: Right;  Diabetic - oral meds ?Latex  ? COLONOSCOPY    ? CYSTOSCOPY W/ RETROGRADES Bilateral 07/02/2014  ? Procedure: CYSTOSCOPY WITH RETROGRADE PYELOGRAM;  Surgeon: Hollice Espy, MD;  Location: [facility];  Service: Urology;  Laterality: Bilateral;  ? CYSTOSCOPY WITH STENT PLACEMENT Left 07/02/2014  ? Procedure: CYSTOSCOPY WITH STENT PLACEMENT;  Surgeon: Hollice Espy, MD;  Location: [facility];  Service: Urology;  Laterality: Left;  ? DENTAL SURGERY    ? in the [facility]  ? LEFT HEART CATH AND CORONARY ANGIOGRAPHY Left 11/09/2019  ? Procedure: LEFT HEART CATH AND CORONARY ANGIOGRAPHY with Radial Approach;  Surgeon: Yolonda Kida, MD;  Location: [facility];  Service: Cardiovascular;  Laterality: Left;  ? TONSILLECTOMY    ? and adenoids  ? URETEROSCOPY WITH HOLMIUM LASER LITHOTRIPSY Left 07/02/2014  ? Procedure: URETEROSCOPY WITH HOLMIUM LASER LITHOTRIPSY;  Surgeon: Hollice Espy, MD;  Location: [facility];   Service: Urology;  Laterality: Left;  ? ? ?Home Medications:  ?Allergies as of 05/06/2021   ? ?   Reactions  ? Sulfa Antibiotics Nausea And Vomiting, Other (See Comments)  ? Tape Rash  ? adhesive  ? Metformin Diarrhea  ? High dose gives diarrhea  ? Sulfasalazine Nausea Only, Nausea And Vomiting  ? Zoster Vaccine Live Hives, Rash  ? Localized; Vaccine for shingles  ? ?  ? ?  ?Medication List  ?  ? ?  ? Accurate as of May 06, 2021 10:26 AM. If you have any questions, ask your nurse or doctor.  ?  ?  ? ?  ? ?acetaminophen 500 MG tablet ?Commonly known as: TYLENOL ?Take 500 mg by mouth every 8 (eight) hours as needed for mild pain or moderate pain. ?  ?amiodarone 100 MG tablet ?Commonly known as: PACERONE ?Take 100 mg by mouth daily. ?  ?APPLE CIDER VINEGAR PO ?Take 7 g by mouth once a week. ?  ?aspirin EC 81 MG tablet ?Take 81 mg by mouth daily. ?  ?bisacodyl 5 MG EC tablet ?Commonly known as: DULCOLAX ?Take 5 mg by mouth daily as needed for moderate constipation. ?  ?BLACK ELDERBERRY PO ?Take 300 mg by mouth See admin instructions. Couple times a month ?  ?carboxymethylcellulose 0.5 % Soln ?Commonly known as: REFRESH PLUS ?Place 1 drop into both eyes daily. ?  ?Cholecalciferol 25 MCG (1000 UT) tablet ?Take 1,000 Units by mouth daily. ?  ?clopidogrel 75 MG tablet ?Commonly known as: PLAVIX ?Take 75 mg by mouth daily. ?  ?Cyanocobalamin 1000 MCG Caps ?Take 1,000 mcg by mouth daily. Gummies ?  ?dextromethorphan 30 MG/5ML liquid ?Commonly known as: DELSYM ?Take 15 mg by mouth 2 (two) times daily as needed for cough. ?  ?finasteride 5 MG tablet ?Commonly known as: PROSCAR ?TAKE 1 TABLET BY MOUTH EVERY DAY ?  ?fluticasone 50 MCG/ACT nasal spray ?Commonly known as: FLONASE ?Place 1 spray into the nose 2 (two) times daily as needed for allergies. ?  ?glucose blood test strip ?  ?isosorbide mononitrate 60 MG 24 hr tablet ?Commonly known as: IMDUR ?Take 60 mg by mouth daily. ?  ?Jardiance 10 MG Tabs tablet ?Generic drug:  empagliflozin ?Take 10 mg by mouth daily. ?  ?loperamide 2 MG tablet ?Commonly known as: IMODIUM A-D ?Take 2 mg by mouth 3 (three) times daily as needed for diarrhea or loose stools. ?  ?lovastatin 40 MG tablet ?Commonly known as: MEVACOR ?Take 40 mg by mouth at bedtime. ?  ?metFORMIN 500 MG tablet ?Commonly known as: GLUCOPHAGE ?Take 500 mg by mouth 3 (three) times daily. ?  ?metoprolol tartrate 25 MG tablet ?Commonly known as: LOPRESSOR ?Take 25 mg by mouth daily as needed (High blood pressure over 140). ?  ?mirabegron ER 25 MG Tb24 tablet ?Commonly known as: MYRBETRIQ ?Take 1 tablet (25 mg total) by mouth daily. ?  ?multivitamin with minerals tablet ?Take 1 tablet by mouth daily. Centrum silver  50 + ?  ?nystatin-triamcinolone ointment ?Commonly known as: MYCOLOG ?Apply 1 application. topically 2 (two) times daily. ?Started by: Zara Council, PA-C ?  ?pantoprazole 40 MG tablet ?Commonly known as: PROTONIX ?Take 40 mg by mouth daily. ?  ?Preparation H 1-0.25-14.4-15 % Crea ?Generic drug: Pramox-PE-Glycerin-Petrolatum ?Apply 1 Applicatorful topically daily as needed (Hemorrhoids). ?  ?ranolazine 500 MG 12 hr tablet ?Commonly known as: RANEXA ?Take 500 mg by mouth 2 (two) times daily. ?  ?tamsulosin 0.4 MG Caps capsule ?Commonly known as: FLOMAX ?TAKE 1 CAPSULE BY MOUTH EVERY DAY ?  ?torsemide 5 MG tablet ?Commonly known as: DEMADEX ?Take 5 mg by mouth daily. ?  ?triamcinolone ointment 0.1 % ?Commonly known as: KENALOG ?Apply 1 application topically daily as needed (rash). ?  ?Tums Ultra 1000 400 MG chewable tablet ?Generic drug: calcium elemental as carbonate ?Chew 1,000 mg by mouth daily as needed for heartburn. ?  ? ?  ? ? ?Allergies:  ?Allergies  ?Allergen Reactions  ? Sulfa Antibiotics Nausea And Vomiting and Other (See Comments)  ? Tape Rash  ?  adhesive  ? Metformin Diarrhea  ?  High dose gives diarrhea ?  ? Sulfasalazine Nausea Only and Nausea And Vomiting  ? Zoster Vaccine Live Hives and Rash  ?   Localized; Vaccine for shingles ?  ? ? ?Family History: ?Family History  ?Problem Relation Age of Onset  ? Kidney disease Mother   ? Prostate cancer Neg Hx   ? Kidney cancer Neg Hx   ? Bladder Cancer Neg Hx   ? ? ?Social H

## 2021-05-29 ENCOUNTER — Ambulatory Visit (INDEPENDENT_AMBULATORY_CARE_PROVIDER_SITE_OTHER): Payer: Medicare HMO | Admitting: Urology

## 2021-05-29 VITALS — BP 113/64 | HR 76 | Ht 70.0 in | Wt 217.0 lb

## 2021-05-29 DIAGNOSIS — N401 Enlarged prostate with lower urinary tract symptoms: Secondary | ICD-10-CM | POA: Diagnosis not present

## 2021-05-29 DIAGNOSIS — N138 Other obstructive and reflux uropathy: Secondary | ICD-10-CM | POA: Diagnosis not present

## 2021-05-29 DIAGNOSIS — N476 Balanoposthitis: Secondary | ICD-10-CM

## 2021-05-29 NOTE — Progress Notes (Signed)
05/30/21 2:08 PM   Jared Ball December 03, 1932 [id number]  Referring provider:  Kirk Ruths, MD [place] [facility] [place],  [place] [address]  Urological history  1. BPH with LU TS -aged out of prostate cancer screening  -Managed with tamsulosin 0.4 mg daily and finasteride 5 mg daily   2. Nephrolithiasis -left URS in 08/2014 for 2 non obstructing stones, measuring up to 11 mm -KUB 2017 - no stones    3.  Incontinence -Contributing factors of age, arthritis, anxiety, BPH, hypertension, untreated sleep apnea, diabetes, obesity, spinal stenosis of lumbar spine, lymphedema and diuretics   4. Nocturia -Risk factors for nocturia: obstructive sleep apnea, hypertension, diabetes, arthritis, heart disease, anxiety and BPH.    5. Balanoposthitis  - Managed with mycolog II cream x2 daily to penis   Chief Complaint  Patient presents with   Follow-up     HPI: Jared Ball is a 88 y.o.male who presents for a two week follow-up.   He reports today that his balanoposthitis has improved on Mycolog cream.   He is interested in a urolift today in light of urinary symptoms. He has urinary frequency and split stream.   Patient denies any modifying or aggravating factors.  Patient denies any gross hematuria, dysuria or suprapubic/flank pain.  Patient denies any fevers, chills, nausea or vomiting.    PMH: Past Medical History:  Diagnosis Date   Anxiety    Arthritis    Atrial fibrillation (HCC)    BPH (benign prostatic hyperplasia)    Bronchitis    Chronic kidney disease    Cognitive changes    Diabetes mellitus without complication (HCC)    Type 2   Dysrhythmia    GERD (gastroesophageal reflux disease)    Headache    History of kidney stones    hx of   HOH (hard of hearing)    Hypercholesteremia    Hypertension    IBS (irritable bowel syndrome)    PONV (postoperative nausea and vomiting)    Shingles 15 yrs ago   HX of   Shortness of breath  dyspnea    heart related   Sleep apnea    no CPAP   Spinal stenosis    Vertigo    Vitamin D deficiency     Surgical History: Past Surgical History:  Procedure Laterality Date   CAROTID PTA/STENT INTERVENTION Left 11/26/2020   Procedure: CAROTID PTA/STENT INTERVENTION;  Surgeon: Katha Cabal, MD;  Location: [facility];  Service: Cardiovascular;  Laterality: Left;   CATARACT EXTRACTION W/PHACO Left 05/17/2019   Procedure: CATARACT EXTRACTION PHACO AND INTRAOCULAR LENS PLACEMENT ([place]) LEFT DIABETIC MALYUGIN;  Surgeon: Leandrew Koyanagi, MD;  Location: [facility];  Service: Ophthalmology;  Laterality: Left;  16.54 1:31.1 18.2%   CATARACT EXTRACTION W/PHACO Right 07/12/2019   Procedure: CATARACT EXTRACTION PHACO AND INTRAOCULAR LENS PLACEMENT ([facility]) RIGHT MALYUGIN DIABETIC 14.90 01:37.8 15.3%;  Surgeon: Leandrew Koyanagi, MD;  Location: [facility];  Service: Ophthalmology;  Laterality: Right;  Diabetic - oral meds Latex   COLONOSCOPY     CYSTOSCOPY W/ RETROGRADES Bilateral 07/02/2014   Procedure: CYSTOSCOPY WITH RETROGRADE PYELOGRAM;  Surgeon: Hollice Espy, MD;  Location: [facility];  Service: Urology;  Laterality: Bilateral;   CYSTOSCOPY WITH STENT PLACEMENT Left 07/02/2014   Procedure: CYSTOSCOPY WITH STENT PLACEMENT;  Surgeon: Hollice Espy, MD;  Location: [facility];  Service: Urology;  Laterality: Left;   DENTAL SURGERY     in the [employer]  LEFT HEART CATH AND CORONARY ANGIOGRAPHY Left 11/09/2019   Procedure: LEFT HEART CATH AND CORONARY ANGIOGRAPHY with Radial Approach;  Surgeon: Yolonda Kida, MD;  Location: [facility];  Service: Cardiovascular;  Laterality: Left;   TONSILLECTOMY     and adenoids   URETEROSCOPY WITH HOLMIUM LASER LITHOTRIPSY Left 07/02/2014   Procedure: URETEROSCOPY WITH HOLMIUM LASER LITHOTRIPSY;  Surgeon: Hollice Espy, MD;  Location: [facility];  Service: Urology;  Laterality: Left;    Home Medications:   Allergies as of 05/29/2021       Reactions   Sulfa Antibiotics Nausea And Vomiting, Other (See Comments)   Tape Rash   adhesive   Metformin Diarrhea   High dose gives diarrhea   Sulfasalazine Nausea Only, Nausea And Vomiting   Zoster Vaccine Live Hives, Rash   Localized; Vaccine for shingles        Medication List        Accurate as of May 29, 2021 11:59 PM. If you have any questions, ask your nurse or doctor.          acetaminophen 500 MG tablet Commonly known as: TYLENOL Take 500 mg by mouth every 8 (eight) hours as needed for mild pain or moderate pain.   amiodarone 100 MG tablet Commonly known as: PACERONE Take 100 mg by mouth daily.   APPLE CIDER VINEGAR PO Take 7 g by mouth once a week.   aspirin EC 81 MG tablet Take 81 mg by mouth daily.   bisacodyl 5 MG EC tablet Commonly known as: DULCOLAX Take 5 mg by mouth daily as needed for moderate constipation.   BLACK ELDERBERRY PO Take 300 mg by mouth See admin instructions. Couple times a month   carboxymethylcellulose 0.5 % Soln Commonly known as: REFRESH PLUS Place 1 drop into both eyes daily.   Cholecalciferol 25 MCG (1000 UT) tablet Take 1,000 Units by mouth daily.   clopidogrel 75 MG tablet Commonly known as: PLAVIX Take 75 mg by mouth daily.   Cyanocobalamin 1000 MCG Caps Take 1,000 mcg by mouth daily. Gummies   dextromethorphan 30 MG/5ML liquid Commonly known as: DELSYM Take 15 mg by mouth 2 (two) times daily as needed for cough.   finasteride 5 MG tablet Commonly known as: PROSCAR TAKE 1 TABLET BY MOUTH EVERY DAY   fluticasone 50 MCG/ACT nasal spray Commonly known as: FLONASE Place 1 spray into the nose 2 (two) times daily as needed for allergies.   glucose blood test strip   isosorbide mononitrate 60 MG 24 hr tablet Commonly known as: IMDUR Take 60 mg by mouth daily.   Jardiance 10 MG Tabs tablet Generic drug: empagliflozin Take 10 mg by mouth daily.   loperamide 2 MG  tablet Commonly known as: IMODIUM A-D Take 2 mg by mouth 3 (three) times daily as needed for diarrhea or loose stools.   lovastatin 40 MG tablet Commonly known as: MEVACOR Take 40 mg by mouth at bedtime.   metFORMIN 500 MG tablet Commonly known as: GLUCOPHAGE Take 500 mg by mouth 3 (three) times daily.   metoprolol tartrate 25 MG tablet Commonly known as: LOPRESSOR Take 25 mg by mouth daily as needed (High blood pressure over 140).   mirabegron ER 25 MG Tb24 tablet Commonly known as: MYRBETRIQ Take 1 tablet (25 mg total) by mouth daily.   multivitamin with minerals tablet Take 1 tablet by mouth daily. Centrum silver 50 +   nystatin-triamcinolone ointment Commonly known as: MYCOLOG Apply 1 application. topically 2 (  two) times daily.   pantoprazole 40 MG tablet Commonly known as: PROTONIX Take 40 mg by mouth daily.   Preparation H 1-0.25-14.4-15 % Crea Generic drug: Pramox-PE-Glycerin-Petrolatum Apply 1 Applicatorful topically daily as needed (Hemorrhoids).   ranolazine 500 MG 12 hr tablet Commonly known as: RANEXA Take 500 mg by mouth 2 (two) times daily.   tamsulosin 0.4 MG Caps capsule Commonly known as: FLOMAX TAKE 1 CAPSULE BY MOUTH EVERY DAY   torsemide 5 MG tablet Commonly known as: DEMADEX Take 5 mg by mouth daily.   triamcinolone ointment 0.1 % Commonly known as: KENALOG Apply 1 application topically daily as needed (rash).   Tums Ultra 1000 400 MG chewable tablet Generic drug: calcium elemental as carbonate Chew 1,000 mg by mouth daily as needed for heartburn.        Allergies:  Allergies  Allergen Reactions   Sulfa Antibiotics Nausea And Vomiting and Other (See Comments)   Tape Rash    adhesive   Metformin Diarrhea    High dose gives diarrhea    Sulfasalazine Nausea Only and Nausea And Vomiting   Zoster Vaccine Live Hives and Rash    Localized; Vaccine for shingles     Family History: Family History  Problem Relation Age of Onset    Kidney disease Mother    Prostate cancer Neg Hx    Kidney cancer Neg Hx    Bladder Cancer Neg Hx     Social History:  reports that he quit smoking about 33 years ago. His smoking use included cigarettes. He has never used smokeless tobacco. He reports current alcohol use. He reports that he does not use drugs.   Physical Exam: BP 113/64   Pulse 76   Ht '5\' 10"'$  (1.778 m)   Wt 217 lb (98.4 kg)   BMI 31.14 kg/m   Constitutional:  Alert and oriented, No acute distress. HEENT: [place] AT, moist mucus membranes.  Trachea midline Cardiovascular: No clubbing, cyanosis, or edema. Respiratory: Normal respiratory effort, no increased work of breathing. GU:  No CVA tenderness.  No bladder fullness or masses.  Patient with uncircumcised phallus. Foreskin easily retracted  Urethral meatus is patent. No penile discharge. No penile lesions or rashes. Scrotum without lesions, cysts, rashes and/or edema.  Bilateral hydroceles, testicles are located scrotally bilaterally. No masses are appreciated in the testicles. Left and right epididymis are normal. Neurologic: Grossly intact, no focal deficits, moving all 4 extremities. Psychiatric: Normal mood and affect.  Laboratory Data: N/A  Pertinent Imaging N/A   Assessment & Plan:   1.  Balanoposthitis - resolved   2. BPH with LUTS - He is interested in an outlet procedure today primarily Urolift. We discussed that we would need to review his prostate anatomy to see if he is a candidate for this procedure through cystoscopy and TRUS. He would like information about this procedure and he will call if he is interested in pursuing cystoscopy and TRUS - In the interim Continue tamsulosin 0.4 mg daily and finasteride 5 mg daily  Return in about 1 year (around 05/30/2022) for I P[facility] .  [facility] [address] [place],  [address] [phone]  I, Kirke Shaggy Littlejohn,acting as a scribe for [facility], PA-C.,have  documented all relevant documentation on the behalf of Kateland Leisinger, PA-C,as directed by  [facility], PA-C while in the presence of [place], PA-C.  I have reviewed the above documentation for accuracy and completeness, and I agree with the above.  Zara Council, PA-C

## 2021-05-30 ENCOUNTER — Encounter: Payer: Self-pay | Admitting: Urology

## 2021-08-26 ENCOUNTER — Ambulatory Visit (INDEPENDENT_AMBULATORY_CARE_PROVIDER_SITE_OTHER): Payer: Medicare HMO | Admitting: Podiatry

## 2021-08-26 ENCOUNTER — Encounter: Payer: Self-pay | Admitting: Podiatry

## 2021-08-26 DIAGNOSIS — M25373 Other instability, unspecified ankle: Secondary | ICD-10-CM | POA: Diagnosis not present

## 2021-09-02 NOTE — Progress Notes (Signed)
Subjective:  Patient ID: Jared Ball, male    DOB: Jul 17, 1932,  MRN: [id number]  Chief Complaint  Patient presents with   Nail Problem    88 y.o. male presents with the above complaint.  Patient presents with complaint of bilateral ankle instability.  Patient states that when he is ambulating his ankle gives out.  He does ambulate for a little bit.  He states that he would like to get them checked out.  Does not wear any kind of stability bracing.  He has not seen anyone else prior to seeing me.  Denies any other acute complaints.  He is a diabetic.  No pain.  No gross dislocation noted.   Review of Systems: Negative except as noted in the HPI. Denies N/V/F/Ch.  Past Medical History:  Diagnosis Date   Anxiety    Arthritis    Atrial fibrillation (HCC)    BPH (benign prostatic hyperplasia)    Bronchitis    Chronic kidney disease    Cognitive changes    Diabetes mellitus without complication (HCC)    Type 2   Dysrhythmia    GERD (gastroesophageal reflux disease)    Headache    History of kidney stones    hx of   HOH (hard of hearing)    Hypercholesteremia    Hypertension    IBS (irritable bowel syndrome)    PONV (postoperative nausea and vomiting)    Shingles 15 yrs ago   HX of   Shortness of breath dyspnea    heart related   Sleep apnea    no CPAP   Spinal stenosis    Vertigo    Vitamin D deficiency     Current Outpatient Medications:    acetaminophen (TYLENOL) 500 MG tablet, Take 500 mg by mouth every 8 (eight) hours as needed for mild pain or moderate pain.  (Patient not taking: Reported on 05/06/2021), Disp: , Rfl:    amiodarone (PACERONE) 100 MG tablet, Take 100 mg by mouth daily., Disp: , Rfl:    APPLE CIDER VINEGAR PO, Take 7 g by mouth once a week. (Patient not taking: Reported on 05/06/2021), Disp: , Rfl:    aspirin EC 81 MG tablet, Take 81 mg by mouth daily. , Disp: , Rfl:    bisacodyl (DULCOLAX) 5 MG EC tablet, Take 5 mg by mouth daily as needed for moderate  constipation. (Patient not taking: Reported on 05/06/2021), Disp: , Rfl:    BLACK ELDERBERRY PO, Take 300 mg by mouth See admin instructions. Couple times a month (Patient not taking: Reported on 05/06/2021), Disp: , Rfl:    calcium elemental as carbonate (TUMS ULTRA 1000) 400 MG chewable tablet, Chew 1,000 mg by mouth daily as needed for heartburn. (Patient not taking: Reported on 05/06/2021), Disp: , Rfl:    carboxymethylcellulose (REFRESH PLUS) 0.5 % SOLN, Place 1 drop into both eyes daily., Disp: , Rfl:    Cholecalciferol 25 MCG (1000 UT) tablet, Take 1,000 Units by mouth daily. , Disp: , Rfl:    clopidogrel (PLAVIX) 75 MG tablet, Take 75 mg by mouth daily., Disp: , Rfl:    Cyanocobalamin 1000 MCG CAPS, Take 1,000 mcg by mouth daily. Gummies, Disp: , Rfl:    dextromethorphan (DELSYM) 30 MG/5ML liquid, Take 15 mg by mouth 2 (two) times daily as needed for cough. (Patient not taking: Reported on 05/06/2021), Disp: , Rfl:    finasteride (PROSCAR) 5 MG tablet, TAKE 1 TABLET BY MOUTH EVERY DAY, Disp: 90 tablet, Rfl: 3  fluticasone (FLONASE) 50 MCG/ACT nasal spray, Place 1 spray into the nose 2 (two) times daily as needed for allergies. (Patient not taking: Reported on 05/06/2021), Disp: , Rfl:    glucose blood test strip, , Disp: , Rfl:    isosorbide mononitrate (IMDUR) 60 MG 24 hr tablet, Take 60 mg by mouth daily., Disp: , Rfl:    JARDIANCE 10 MG TABS tablet, Take 10 mg by mouth daily., Disp: , Rfl:    loperamide (IMODIUM A-D) 2 MG tablet, Take 2 mg by mouth 3 (three) times daily as needed for diarrhea or loose stools. , Disp: , Rfl:    lovastatin (MEVACOR) 40 MG tablet, Take 40 mg by mouth at bedtime. , Disp: , Rfl:    metFORMIN (GLUCOPHAGE) 500 MG tablet, Take 500 mg by mouth 3 (three) times daily. , Disp: , Rfl:    metoprolol tartrate (LOPRESSOR) 25 MG tablet, Take 25 mg by mouth daily as needed (High blood pressure over 140). (Patient not taking: Reported on 05/06/2021), Disp: , Rfl:    mirabegron  ER (MYRBETRIQ) 25 MG TB24 tablet, Take 1 tablet (25 mg total) by mouth daily. (Patient not taking: Reported on 05/06/2021), Disp: 28 tablet, Rfl: 0   Multiple Vitamins-Minerals (MULTIVITAMIN WITH MINERALS) tablet, Take 1 tablet by mouth daily. Centrum silver 50 +, Disp: , Rfl:    nystatin-triamcinolone ointment (MYCOLOG), Apply 1 application. topically 2 (two) times daily., Disp: 30 g, Rfl: 3   pantoprazole (PROTONIX) 40 MG tablet, Take 40 mg by mouth daily. , Disp: , Rfl:    Pramox-PE-Glycerin-Petrolatum (PREPARATION H) 1-0.25-14.4-15 % CREA, Apply 1 Applicatorful topically daily as needed (Hemorrhoids). (Patient not taking: Reported on 05/06/2021), Disp: , Rfl:    ranolazine (RANEXA) 500 MG 12 hr tablet, Take 500 mg by mouth 2 (two) times daily. , Disp: , Rfl:    tamsulosin (FLOMAX) 0.4 MG CAPS capsule, TAKE 1 CAPSULE BY MOUTH EVERY DAY, Disp: 90 capsule, Rfl: 3   torsemide (DEMADEX) 5 MG tablet, Take 5 mg by mouth daily. , Disp: , Rfl:    triamcinolone ointment (KENALOG) 0.1 %, Apply 1 application topically daily as needed (rash). (Patient not taking: Reported on 05/06/2021), Disp: , Rfl:   Social History   Tobacco Use  Smoking Status Former   Types: Cigarettes   Quit date: 1990   Years since quitting: 33.6  Smokeless Tobacco Never    Allergies  Allergen Reactions   Sulfa Antibiotics Nausea And Vomiting and Other (See Comments)   Tape Rash    adhesive   Metformin Diarrhea    High dose gives diarrhea    Sulfasalazine Nausea Only and Nausea And Vomiting   Zoster Vaccine Live Hives and Rash    Localized; Vaccine for shingles    Objective:  There were no vitals filed for this visit. There is no height or weight on file to calculate BMI. Constitutional Well developed. Well nourished.  Vascular Dorsalis pedis pulses palpable bilaterally. Posterior tibial pulses palpable bilaterally. Capillary refill normal to all digits.  No cyanosis or clubbing noted. Pedal hair growth normal.   Neurologic Normal speech. Oriented to person, place, and time. Epicritic sensation to light touch grossly present bilaterally.  Dermatologic Nails well groomed and normal in appearance. No open wounds. No skin lesions.  Orthopedic: Mild pain on palpation to the ATFL ligament bilaterally.  No pain at the Achilles tendon peroneal tendon posterior tibial tendon.  Negative anterior drawer test or talar tilt test.  Gross instability/subjective instability noted to bilateral  ankle   Radiographs: None Assessment:   1. Instability of ankle, unspecified laterality    Plan:  Patient was evaluated and treated and all questions answered.  Bilateral chronic ankle instability -All questions and concerns were discussed with the patient in extensive detail.  Given the instability that he experiences while ambulating he will benefit from Tri-Lock ankle brace while ambulating.  I discussed with the importance of ankle bracing he states understanding.  In the future if the ankle brace BUT gets worn out patient will benefit from AFO brace.  Patient agrees with the plan  Return in about 3 months (around 11/26/2021) for [facility] .

## 2021-09-23 ENCOUNTER — Other Ambulatory Visit (INDEPENDENT_AMBULATORY_CARE_PROVIDER_SITE_OTHER): Payer: Self-pay | Admitting: Vascular Surgery

## 2021-09-23 DIAGNOSIS — I6523 Occlusion and stenosis of bilateral carotid arteries: Secondary | ICD-10-CM

## 2021-09-24 NOTE — Progress Notes (Signed)
MRN : [id number]  Jared Ball is a 88 y.o. (1932-12-23) male who presents with chief complaint of check carotid arteries.  History of Present Illness:   The patient is seen for follow up evaluation of carotid stenosis status post left carotid stent on 11/26/2020.     Procedure:  Placement of a 10 x 8 x 30 exact stent with the use of the NAV-6 embolic protection device in the left internal carotid artery   There were no post operative problems or complications related to the surgery.  The patient denies neck or incisional pain.   The patient denies interval amaurosis fugax. There is no recent history of TIA symptoms or focal motor deficits. There is no prior documented CVA.   The patient denies headache.   The patient is taking enteric-coated aspirin 81 mg daily.   The patient has a history of coronary artery disease, no recent episodes of angina or shortness of breath. The patient denies PAD or claudication symptoms. There is a history of hyperlipidemia which is being treated with a statin.     Duplex ultrasound of the carotid arteries RICA 1-39% and Left 40-59% s/p stent.  No change compared toe study on 03/24/2021.  No outpatient medications have been marked as taking for the 09/25/21 encounter (Appointment) with Delana Meyer, Dolores Lory, MD.    Past Medical History:  Diagnosis Date   Anxiety    Arthritis    Atrial fibrillation ([place])    BPH (benign prostatic hyperplasia)    Bronchitis    Chronic kidney disease    Cognitive changes    Diabetes mellitus without complication ([place])    Type 2   Dysrhythmia    GERD (gastroesophageal reflux disease)    Headache    History of kidney stones    hx of   HOH (hard of hearing)    Hypercholesteremia    Hypertension    IBS (irritable bowel syndrome)    PONV (postoperative nausea and vomiting)    Shingles 15 yrs ago   HX of   Shortness of breath dyspnea    heart related   Sleep apnea    no CPAP   Spinal stenosis    Vertigo     Vitamin D deficiency     Past Surgical History:  Procedure Laterality Date   CAROTID PTA/STENT INTERVENTION Left 11/26/2020   Procedure: CAROTID PTA/STENT INTERVENTION;  Surgeon: Katha Cabal, MD;  Location: [facility];  Service: Cardiovascular;  Laterality: Left;   CATARACT EXTRACTION W/PHACO Left 05/17/2019   Procedure: CATARACT EXTRACTION PHACO AND INTRAOCULAR LENS PLACEMENT ([place]) LEFT DIABETIC MALYUGIN;  Surgeon: Leandrew Koyanagi, MD;  Location: [facility];  Service: Ophthalmology;  Laterality: Left;  16.54 1:31.1 18.2%   CATARACT EXTRACTION W/PHACO Right 07/12/2019   Procedure: CATARACT EXTRACTION PHACO AND INTRAOCULAR LENS PLACEMENT ([facility]) RIGHT MALYUGIN DIABETIC 14.90 01:37.8 15.3%;  Surgeon: Leandrew Koyanagi, MD;  Location: [facility];  Service: Ophthalmology;  Laterality: Right;  Diabetic - oral meds Latex   COLONOSCOPY     CYSTOSCOPY W/ RETROGRADES Bilateral 07/02/2014   Procedure: CYSTOSCOPY WITH RETROGRADE PYELOGRAM;  Surgeon: Hollice Espy, MD;  Location: [facility];  Service: Urology;  Laterality: Bilateral;   CYSTOSCOPY WITH STENT PLACEMENT Left 07/02/2014   Procedure: CYSTOSCOPY WITH STENT PLACEMENT;  Surgeon: Hollice Espy, MD;  Location: [facility];  Service: Urology;  Laterality: Left;   DENTAL SURGERY     in the [place]  Left 11/09/2019   Procedure: LEFT HEART CATH AND CORONARY ANGIOGRAPHY with Radial Approach;  Surgeon: Yolonda Kida, MD;  Location: [facility];  Service: Cardiovascular;  Laterality: Left;   TONSILLECTOMY     and adenoids   URETEROSCOPY WITH HOLMIUM LASER LITHOTRIPSY Left 07/02/2014   Procedure: URETEROSCOPY WITH HOLMIUM LASER LITHOTRIPSY;  Surgeon: Hollice Espy, MD;  Location: [facility];  Service: Urology;  Laterality: Left;    Social History Social History   Tobacco Use   Smoking status: Former    Types: Cigarettes    Quit date: 1990    Years since  quitting: 33.7   Smokeless tobacco: Never  Substance Use Topics   Alcohol use: Yes    Alcohol/week: 0.0 standard drinks of alcohol    Comment: red wine occasionally   Drug use: No    Family History Family History  Problem Relation Age of Onset   Kidney disease Mother    Prostate cancer Neg Hx    Kidney cancer Neg Hx    Bladder Cancer Neg Hx     Allergies  Allergen Reactions   Sulfa Antibiotics Nausea And Vomiting and Other (See Comments)   Tape Rash    adhesive   Metformin Diarrhea    High dose gives diarrhea    Sulfasalazine Nausea Only and Nausea And Vomiting   Zoster Vaccine Live Hives and Rash    Localized; Vaccine for shingles      REVIEW OF SYSTEMS (Negative unless checked)  Constitutional: '[]'$ Weight loss  '[]'$ Fever  '[]'$ Chills Cardiac: '[]'$ Chest pain   '[]'$ Chest pressure   '[]'$ Palpitations   '[]'$ Shortness of breath when laying flat   '[]'$ Shortness of breath with exertion. Vascular:  '[x]'$ Pain in legs with walking   '[]'$ Pain in legs at rest  '[]'$ History of DVT   '[]'$ Phlebitis   '[]'$ Swelling in legs   '[]'$ Varicose veins   '[]'$ Non-healing ulcers Pulmonary:   '[]'$ Uses home oxygen   '[]'$ Productive cough   '[]'$ Hemoptysis   '[]'$ Wheeze  '[]'$ COPD   '[]'$ Asthma Neurologic:  '[]'$ Dizziness   '[]'$ Seizures   '[]'$ History of stroke   '[]'$ History of TIA  '[]'$ Aphasia   '[]'$ Vissual changes   '[]'$ Weakness or numbness in arm   '[]'$ Weakness or numbness in leg Musculoskeletal:   '[]'$ Joint swelling   '[]'$ Joint pain   '[]'$ Low back pain Hematologic:  '[]'$ Easy bruising  '[]'$ Easy bleeding   '[]'$ Hypercoagulable state   '[]'$ Anemic Gastrointestinal:  '[]'$ Diarrhea   '[]'$ Vomiting  '[]'$ Gastroesophageal reflux/heartburn   '[]'$ Difficulty swallowing. Genitourinary:  '[]'$ Chronic kidney disease   '[]'$ Difficult urination  '[]'$ Frequent urination   '[]'$ Blood in urine Skin:  '[]'$ Rashes   '[]'$ Ulcers  Psychological:  '[]'$ History of anxiety   '[]'$  History of major depression.  Physical Examination  There were no vitals filed for this visit. There is no height or weight on file to calculate  BMI. Gen: WD/WN, NAD Head: [place]/AT, No temporalis wasting.  Ear/Nose/Throat: Hearing grossly intact, nares w/o erythema or drainage Eyes: PER, EOMI, sclera nonicteric.  Neck: Supple, no masses.  No bruit or JVD.  Pulmonary:  Good air movement, no audible wheezing, no use of accessory muscles.  Cardiac: RRR, normal S1, S2, no Murmurs. Vascular:  carotid bruit noted Vessel Right Left  Radial Palpable Palpable  Carotid  Palpable  Palpable  Subclav  Palpable Palpable  Gastrointestinal: soft, non-distended. No guarding/no peritoneal signs.  Musculoskeletal: M/S 5/5 throughout.  No visible deformity.  Neurologic: CN 2-12 intact. Pain and light touch intact in extremities.  Symmetrical.  Speech is fluent. Motor exam as listed above. Psychiatric:  Judgment intact, Mood & affect appropriate for pt's clinical situation. Dermatologic: No rashes or ulcers noted.  No changes consistent with cellulitis.   CBC Lab Results  Component Value Date   WBC 10.3 11/27/2020   HGB 13.2 11/27/2020   HCT 39.7 11/27/2020   MCV 87.4 11/27/2020   PLT 195 11/27/2020    BMET    Component Value Date/Time   NA 137 11/27/2020 0438   NA 138 01/01/2014 1821   K 3.9 11/27/2020 0438   K 4.3 01/01/2014 1821   CL 104 11/27/2020 0438   CL 103 01/01/2014 1821   CO2 25 11/27/2020 0438   CO2 28 01/01/2014 1821   GLUCOSE 151 (H) 11/27/2020 0438   GLUCOSE 109 (H) 01/01/2014 1821   BUN 16 11/27/2020 0438   BUN 25 (H) 01/01/2014 1821   CREATININE 0.90 11/27/2020 0438   CREATININE 1.20 01/01/2014 1821   CALCIUM 8.7 (L) 11/27/2020 0438   CALCIUM 9.2 01/01/2014 1821   GFRNONAA >60 11/27/2020 0438   GFRNONAA >60 01/01/2014 1821   GFRAA >60 07/19/2016 1142   GFRAA >60 01/01/2014 1821   CrCl cannot be calculated (Patient's most recent lab result is older than the maximum 21 days allowed.).  COAG No results found for: "INR", "PROTIME"  Radiology No results found.   Assessment/Plan 1. Carotid stenosis,  symptomatic w/o infarct, right Recommend:   The patient is s/p successful left carotid stent 11/26/2020.   Duplex ultrasound of the carotid arteries RICA 1-39% and Left 40-59% s/p stent.  No change compared toe study on 112/14/2022.   Continue antiplatelet therapy as prescribed Continue management of CAD, HTN and Hyperlipidemia Healthy heart diet,  encouraged exercise at least 4 times per week   Follow up in 12 months with duplex ultrasound and physical exam based on the patient's carotid stent and <50% stenosis of the right carotid artery   - VAS US CAROTID; Future  2. Chronic venous insufficiency No surgery or intervention at this point in time.     I have reviewed my discussion with the patient regarding venous insufficiency and secondary lymph edema and why it  causes symptoms. I have discussed with the patient the chronic skin changes that accompany these problems and the long term sequela such as ulceration and infection.  Patient will continue wearing graduated compression stockings class 1 (20-30 mmHg) on a daily basis a prescription was given to the patient to keep this updated. The patient will  put the stockings on first thing in the morning and removing them in the evening. The patient is instructed specifically not to sleep in the stockings.  In addition, behavioral modification including elevation during the day will be continued.  Diet and salt restriction was also discussed.   Previous duplex ultrasound of the lower extremities shows normal deep venous system, superficial reflux was not present.    Following the review of the ultrasound the patient will follow up in 12 months to reassess the degree of swelling and the control that graduated compression is offering.   The patient can be assessed for a Lymph Pump at that time.  However, at this time  3. HTN (hypertension), benign Continue antihypertensive medications as already ordered, these medications have been reviewed and  there are no changes at this time.   4. Chronic atrial fibrillation (HCC) Continue antiarrhythmia medications as already ordered, these medications have been reviewed and there are no changes at this time.  Continue anticoagulation as ordered by Cardiology Service   5.  Type 2 diabetes mellitus without complication, unspecified whether long term insulin use (HCC) Continue hypoglycemic medications as already ordered, these medications have been reviewed and there are no changes at this time.  Hgb A1C to be monitored as already arranged by primary service   6. Bilateral carotid artery stenosis See #1 - VAS US CAROTID    Hortencia Pilar, MD  09/24/2021 8:51 PM

## 2021-09-25 ENCOUNTER — Ambulatory Visit (INDEPENDENT_AMBULATORY_CARE_PROVIDER_SITE_OTHER): Payer: Medicare HMO | Admitting: Vascular Surgery

## 2021-09-25 ENCOUNTER — Encounter (INDEPENDENT_AMBULATORY_CARE_PROVIDER_SITE_OTHER): Payer: Self-pay | Admitting: Vascular Surgery

## 2021-09-25 ENCOUNTER — Ambulatory Visit (INDEPENDENT_AMBULATORY_CARE_PROVIDER_SITE_OTHER): Payer: Medicare HMO

## 2021-09-25 VITALS — BP 97/54 | HR 76 | Resp 18 | Ht 70.0 in | Wt 225.0 lb

## 2021-09-25 DIAGNOSIS — E119 Type 2 diabetes mellitus without complications: Secondary | ICD-10-CM

## 2021-09-25 DIAGNOSIS — I6521 Occlusion and stenosis of right carotid artery: Secondary | ICD-10-CM

## 2021-09-25 DIAGNOSIS — I6523 Occlusion and stenosis of bilateral carotid arteries: Secondary | ICD-10-CM

## 2021-09-25 DIAGNOSIS — I482 Chronic atrial fibrillation, unspecified: Secondary | ICD-10-CM

## 2021-09-25 DIAGNOSIS — I10 Essential (primary) hypertension: Secondary | ICD-10-CM

## 2021-09-25 DIAGNOSIS — I872 Venous insufficiency (chronic) (peripheral): Secondary | ICD-10-CM

## 2021-09-26 ENCOUNTER — Encounter (INDEPENDENT_AMBULATORY_CARE_PROVIDER_SITE_OTHER): Payer: Medicare HMO

## 2021-09-27 ENCOUNTER — Encounter (INDEPENDENT_AMBULATORY_CARE_PROVIDER_SITE_OTHER): Payer: Self-pay | Admitting: Vascular Surgery

## 2021-12-23 ENCOUNTER — Other Ambulatory Visit: Payer: Self-pay | Admitting: Urology

## 2021-12-23 DIAGNOSIS — Z87442 Personal history of urinary calculi: Secondary | ICD-10-CM

## 2022-02-16 ENCOUNTER — Telehealth: Payer: Self-pay | Admitting: Urology

## 2022-02-16 NOTE — Telephone Encounter (Signed)
[name] to verify pt's Triamanolone.1 ointment.  She had a question about the dosage.

## 2022-02-16 NOTE — Telephone Encounter (Signed)
Spoke to patient and informed him that the last ointment we sent to pharmacy was Mycolog. I informed him that it looks like he should have 1 refill if needed. He is scheduled in May.

## 2022-02-26 ENCOUNTER — Ambulatory Visit: Payer: Medicare HMO | Admitting: Podiatry

## 2022-03-06 ENCOUNTER — Other Ambulatory Visit: Payer: Self-pay

## 2022-03-09 ENCOUNTER — Other Ambulatory Visit (HOSPITAL_COMMUNITY): Payer: Self-pay

## 2022-03-09 ENCOUNTER — Other Ambulatory Visit: Payer: Self-pay

## 2022-03-09 MED ORDER — TRULICITY 1.5 MG/0.5ML ~~LOC~~ SOAJ
1.5000 mg | SUBCUTANEOUS | 1 refills | Status: DC
  Filled 2022-03-09: qty 2, 28d supply, fill #0

## 2022-03-09 MED ORDER — TRULICITY 1.5 MG/0.5ML ~~LOC~~ SOAJ
1.5000 mg | SUBCUTANEOUS | 1 refills | Status: DC
  Filled 2022-03-09: qty 2, 28d supply, fill #0
  Filled 2022-03-30: qty 2, 28d supply, fill #1

## 2022-03-10 ENCOUNTER — Other Ambulatory Visit: Payer: Self-pay

## 2022-03-10 MED ORDER — TRULICITY 1.5 MG/0.5ML ~~LOC~~ SOAJ: 1.5000 mg | SUBCUTANEOUS | 1 refills | Status: DC

## 2022-03-30 ENCOUNTER — Other Ambulatory Visit: Payer: Self-pay

## 2022-04-27 ENCOUNTER — Emergency Department
Admission: EM | Admit: 2022-04-27 | Discharge: 2022-04-27 | Disposition: A | Discharge status: Home / Self Care | Payer: Medicare HMO | Attending: Emergency Medicine | Admitting: Emergency Medicine

## 2022-04-27 ENCOUNTER — Other Ambulatory Visit: Payer: Self-pay

## 2022-04-27 ENCOUNTER — Encounter: Payer: Self-pay | Admitting: Emergency Medicine

## 2022-04-27 DIAGNOSIS — E119 Type 2 diabetes mellitus without complications: Secondary | ICD-10-CM | POA: Diagnosis not present

## 2022-04-27 DIAGNOSIS — R55 Syncope and collapse: Secondary | ICD-10-CM

## 2022-04-27 DIAGNOSIS — I10 Essential (primary) hypertension: Secondary | ICD-10-CM | POA: Insufficient documentation

## 2022-04-27 DIAGNOSIS — I2510 Atherosclerotic heart disease of native coronary artery without angina pectoris: Secondary | ICD-10-CM | POA: Insufficient documentation

## 2022-04-27 DIAGNOSIS — E860 Dehydration: Secondary | ICD-10-CM | POA: Insufficient documentation

## 2022-04-27 LAB — URINALYSIS, ROUTINE W REFLEX MICROSCOPIC
Bacteria, UA: NONE SEEN
Bilirubin Urine: NEGATIVE
Glucose, UA: NEGATIVE mg/dL
Hgb urine dipstick: NEGATIVE
Ketones, ur: 5 mg/dL — AB
Leukocytes,Ua: NEGATIVE
Nitrite: NEGATIVE
Protein, ur: 30 mg/dL — AB
Specific Gravity, Urine: 1.018 (ref 1.005–1.030)
pH: 5 (ref 5.0–8.0)

## 2022-04-27 LAB — CBC
HCT: 44 % (ref 39.0–52.0)
Hemoglobin: 14.1 g/dL (ref 13.0–17.0)
MCH: 28.4 pg (ref 26.0–34.0)
MCHC: 32 g/dL (ref 30.0–36.0)
MCV: 88.7 fL (ref 80.0–100.0)
Platelets: 179 10*3/uL (ref 150–400)
RBC: 4.96 MIL/uL (ref 4.22–5.81)
RDW: 14.2 % (ref 11.5–15.5)
WBC: 8.4 10*3/uL (ref 4.0–10.5)
nRBC: 0 % (ref 0.0–0.2)

## 2022-04-27 LAB — BASIC METABOLIC PANEL
Anion gap: 8 (ref 5–15)
BUN: 32 mg/dL — ABNORMAL HIGH (ref 8–23)
CO2: 23 mmol/L (ref 22–32)
Calcium: 8.5 mg/dL — ABNORMAL LOW (ref 8.9–10.3)
Chloride: 106 mmol/L (ref 98–111)
Creatinine, Ser: 1.56 mg/dL — ABNORMAL HIGH (ref 0.61–1.24)
GFR, Estimated: 42 mL/min — ABNORMAL LOW (ref >60)
Glucose, Bld: 201 mg/dL — ABNORMAL HIGH (ref 70–99)
Potassium: 3.8 mmol/L (ref 3.5–5.1)
Sodium: 137 mmol/L (ref 135–145)

## 2022-04-27 MED ORDER — TRULICITY 1.5 MG/0.5ML ~~LOC~~ SOAJ
1.5000 mg | SUBCUTANEOUS | 1 refills | Status: DC
  Filled 2022-04-27: qty 2, 28d supply, fill #0
  Filled 2022-05-19: qty 2, 28d supply, fill #1

## 2022-04-27 NOTE — Discharge Instructions (Addendum)
Please limit your fluid intake to 2.5 L per day

## 2022-04-27 NOTE — ED Triage Notes (Signed)
Patient to ED via [facility] from the [facility] of [place]- independent living. Patient states he was coming back from an appointment eating a milkshake when he suddenly felt dizzy. Patient was lowered to ground and continued to have a syncopal episode. Had N/V since.  18g L AC- given NaCl, 4mg  zofran  EMS VS: 66HR 93% RA 124/56 225 cbg

## 2022-04-27 NOTE — ED Provider Notes (Signed)
[facility] Provider Note   Event Date/Time   First MD Initiated Contact with Patient 04/27/22 1848     (approximate) History  Loss of Consciousness  HPI Jared Ball is a 89 y.o. male with a stated medical history of paroxysmal atrial fibrillation, hypertension, type 2 diabetes, and CAD with 2 clinically significant lesions that are unable to be stented who presents complaining of a presyncopal event.  Patient states that when he got up from sitting drinking a milkshake he began to feel lightheaded and was lowered to the ground before losing consciousness for less than 10 seconds.  Patient returned to baseline within 30 seconds after waking up and sat up only to become lightheaded once again.  Patient laid flat until EMS arrival and noted patient's blood pressure to be 124/56.  Patient was given 400 mL IVF as well as 4 mg Zofran prior to arrival.  Patient states that he did have 1 episode of vomiting prior to arrival.  Patient denies any nausea or vomiting since this occurrence ROS: Patient currently denies any vision changes, tinnitus, difficulty speaking, facial droop, sore throat, chest pain, shortness of breath, abdominal pain, diarrhea, dysuria, or weakness/numbness/paresthesias in any extremity   Physical Exam  Triage Vital Signs: ED Triage Vitals  Enc Vitals Group     BP 04/27/22 1625 126/62     Pulse Rate 04/27/22 1625 63     Resp 04/27/22 1625 18     Temp 04/27/22 1625 98 F (36.7 C)     Temp Source 04/27/22 1625 Oral     SpO2 04/27/22 1625 92 %     Weight --      Height --      Head Circumference --      Peak Flow --      Pain Score 04/27/22 1623 0     Pain Loc --      Pain Edu? --      Excl. in GC? --    Most recent vital signs: Vitals:   04/27/22 2005 04/27/22 2153  BP: (!) 143/73 124/77  Pulse: 65 77  Resp: 18 (!) 23  Temp: 97.7 F (36.5 C)   SpO2: 93% 93%   General: Awake, oriented x4.  Dry cracked lips CV:  Good peripheral perfusion.   Resp:  Normal effort.  Abd:  No distention.  Other:  Elderly overweight Caucasian male laying in bed in no acute distress. ED Results / Procedures / Treatments  Labs (all labs ordered are listed, but only abnormal results are displayed) Labs Reviewed  BASIC METABOLIC PANEL - Abnormal; Notable for the following components:      Result Value   Glucose, Bld 201 (*)    BUN 32 (*)    Creatinine, Ser 1.56 (*)    Calcium 8.5 (*)    GFR, Estimated 42 (*)    All other components within normal limits  URINALYSIS, ROUTINE W REFLEX MICROSCOPIC - Abnormal; Notable for the following components:   Color, Urine YELLOW (*)    APPearance CLEAR (*)    Ketones, ur 5 (*)    Protein, ur 30 (*)    All other components within normal limits  CBC  CBG MONITORING, ED   EKG ED ECG REPORT I, Merwyn Katos, the attending physician, personally viewed and interpreted this ECG. Date: 04/27/2022 EKG Time: 1625 Rate: 64 Rhythm: normal sinus rhythm QRS Axis: normal Intervals: First-degree AV block, IVCD ST/T Wave abnormalities: normal Narrative Interpretation: IVCD and first-degree  AV block.  No evidence of acute ischemia  PROCEDURES: Critical Care performed: No .1-3 Lead EKG Interpretation  Performed by: Merwyn Katos, MD Authorized by: Merwyn Katos, MD     Interpretation: normal     ECG rate:  71   ECG rate assessment: normal     Rhythm: sinus rhythm     Ectopy: none     Conduction: normal    MEDICATIONS ORDERED IN ED: Medications - No data to display IMPRESSION / MDM / ASSESSMENT AND PLAN / ED COURSE  I reviewed the triage vital signs and the nursing notes.                             The patient is on the cardiac monitor to evaluate for evidence of arrhythmia and/or significant heart rate changes. Patient's presentation is most consistent with acute presentation with potential threat to life or bodily function. Patient presents with complaints of syncope/presyncope ED Workup:  CBC,  BMP, Troponin, ECG Differential diagnosis includes HF, ICH, seizure, stroke, HOCM, ACS, aortic dissection, malignant arrhythmia, or GI bleed. Findings: No evidence of acute laboratory abnormalities.  Troponin negative x1 EKG: No e/o STEMI. No evidence of Brugadas sign, delta wave, epsilon wave, significantly prolonged QTc, or malignant arrhythmia.  Disposition: Discharge. Patient is at baseline at this time. Return precautions expressed and understood in person. Advised follow up with primary care provider or clinic physician in next 24 hours.   FINAL CLINICAL IMPRESSION(S) / ED DIAGNOSES   Final diagnoses:  Near syncope  Dehydration   Rx / DC Orders   ED Discharge Orders     None      Note:  This document was prepared using Dragon voice recognition software and may include unintentional dictation errors.   Merwyn Katos, MD 04/27/22 [phone]

## 2022-04-27 NOTE — ED Notes (Signed)
Patient unable to urinate at this time. Ice water provided to patient to drink. Denies additional needs.

## 2022-05-02 ENCOUNTER — Emergency Department: Payer: Medicare HMO

## 2022-05-02 ENCOUNTER — Other Ambulatory Visit: Payer: Self-pay

## 2022-05-02 ENCOUNTER — Emergency Department
Admission: EM | Admit: 2022-05-02 | Discharge: 2022-05-03 | Disposition: A | Discharge status: Home / Self Care | Payer: Medicare HMO | Attending: Emergency Medicine | Admitting: Emergency Medicine

## 2022-05-02 DIAGNOSIS — E119 Type 2 diabetes mellitus without complications: Secondary | ICD-10-CM | POA: Diagnosis not present

## 2022-05-02 DIAGNOSIS — R42 Dizziness and giddiness: Secondary | ICD-10-CM | POA: Insufficient documentation

## 2022-05-02 DIAGNOSIS — I4891 Unspecified atrial fibrillation: Secondary | ICD-10-CM | POA: Diagnosis not present

## 2022-05-02 DIAGNOSIS — Y900 Blood alcohol level of less than 20 mg/100 ml: Secondary | ICD-10-CM | POA: Diagnosis not present

## 2022-05-02 DIAGNOSIS — Z955 Presence of coronary angioplasty implant and graft: Secondary | ICD-10-CM | POA: Insufficient documentation

## 2022-05-02 DIAGNOSIS — R4781 Slurred speech: Secondary | ICD-10-CM | POA: Diagnosis present

## 2022-05-02 DIAGNOSIS — I2510 Atherosclerotic heart disease of native coronary artery without angina pectoris: Secondary | ICD-10-CM | POA: Diagnosis not present

## 2022-05-02 DIAGNOSIS — R4701 Aphasia: Secondary | ICD-10-CM | POA: Diagnosis not present

## 2022-05-02 LAB — CBC WITH DIFFERENTIAL/PLATELET
Abs Immature Granulocytes: 0.04 10*3/uL (ref 0.00–0.07)
Basophils Absolute: 0.1 10*3/uL (ref 0.0–0.1)
Basophils Relative: 1 %
Eosinophils Absolute: 0.2 10*3/uL (ref 0.0–0.5)
Eosinophils Relative: 2 %
HCT: 49.1 % (ref 39.0–52.0)
Hemoglobin: 15.8 g/dL (ref 13.0–17.0)
Immature Granulocytes: 0 %
Lymphocytes Relative: 25 %
Lymphs Abs: 2.5 10*3/uL (ref 0.7–4.0)
MCH: 28.5 pg (ref 26.0–34.0)
MCHC: 32.2 g/dL (ref 30.0–36.0)
MCV: 88.6 fL (ref 80.0–100.0)
Monocytes Absolute: 1.4 10*3/uL — ABNORMAL HIGH (ref 0.1–1.0)
Monocytes Relative: 13 %
Neutro Abs: 6 10*3/uL (ref 1.7–7.7)
Neutrophils Relative %: 59 %
Platelets: 204 10*3/uL (ref 150–400)
RBC: 5.54 MIL/uL (ref 4.22–5.81)
RDW: 13.9 % (ref 11.5–15.5)
WBC: 10.2 10*3/uL (ref 4.0–10.5)
nRBC: 0 % (ref 0.0–0.2)

## 2022-05-02 LAB — COMPREHENSIVE METABOLIC PANEL
ALT: 16 U/L (ref 0–44)
AST: 17 U/L (ref 15–41)
Albumin: 3.7 g/dL (ref 3.5–5.0)
Alkaline Phosphatase: 66 U/L (ref 38–126)
Anion gap: 9 (ref 5–15)
BUN: 25 mg/dL — ABNORMAL HIGH (ref 8–23)
CO2: 26 mmol/L (ref 22–32)
Calcium: 8.9 mg/dL (ref 8.9–10.3)
Chloride: 102 mmol/L (ref 98–111)
Creatinine, Ser: 1.4 mg/dL — ABNORMAL HIGH (ref 0.61–1.24)
GFR, Estimated: 48 mL/min — ABNORMAL LOW (ref >60)
Glucose, Bld: 157 mg/dL — ABNORMAL HIGH (ref 70–99)
Potassium: 3.8 mmol/L (ref 3.5–5.1)
Sodium: 137 mmol/L (ref 135–145)
Total Bilirubin: 1.2 mg/dL (ref 0.3–1.2)
Total Protein: 7.2 g/dL (ref 6.5–8.1)

## 2022-05-02 LAB — ETHANOL: Alcohol, Ethyl (B): <10 mg/dL (ref <10)

## 2022-05-02 LAB — APTT: aPTT: 26 seconds (ref 24–36)

## 2022-05-02 LAB — PROTIME-INR
INR: 1.1 (ref 0.8–1.2)
Prothrombin Time: 14.5 seconds (ref 11.4–15.2)

## 2022-05-02 LAB — TROPONIN I (HIGH SENSITIVITY)
Troponin I (High Sensitivity): 28 ng/L — ABNORMAL HIGH (ref <18)
Troponin I (High Sensitivity): 23 ng/L — ABNORMAL HIGH (ref <18)

## 2022-05-02 MED ORDER — SODIUM CHLORIDE 0.9 % IV BOLUS
1000.0000 mL | Freq: Once | INTRAVENOUS | Status: AC
  Administered 2022-05-02: 1000 mL via INTRAVENOUS

## 2022-05-02 MED ORDER — SODIUM CHLORIDE 0.9% FLUSH: 3.0000 mL | Freq: Once | INTRAVENOUS | Status: DC

## 2022-05-02 MED ORDER — IOHEXOL 350 MG/ML SOLN
75.0000 mL | Freq: Once | INTRAVENOUS | Status: AC | PRN
  Administered 2022-05-02: 75 mL via INTRAVENOUS

## 2022-05-02 MED ORDER — LORAZEPAM 2 MG/ML IJ SOLN: 1.0000 mg | Freq: Once | INTRAMUSCULAR | Status: DC | PRN

## 2022-05-02 MED ORDER — SODIUM CHLORIDE 0.9 % IV BOLUS: 500.0000 mL | Freq: Once | INTRAVENOUS | Status: DC

## 2022-05-02 NOTE — ED Notes (Signed)
Pt CBG 132 

## 2022-05-02 NOTE — Discharge Instructions (Signed)
Thank you for choosing us for your health care today!  Please see your primary doctor this week for a follow up appointment.   Sometimes, in the early stages of certain disease courses it is difficult to detect in the emergency department evaluation -- so, it is important that you continue to monitor your symptoms and call your doctor right away or return to the emergency department if you develop any new or worsening symptoms.   It was my pleasure to care for you today.   Devonn Giampietro S. Deniece Rankin, MD  

## 2022-05-02 NOTE — ED Triage Notes (Addendum)
Pt in via EMS from the [facility] with c/o slurred speech. Per the care taker he had slurred speech that last 4-5 minutes and has now cleared. Episode happened about 45 minutes ago. Pt is alert and oriented and ambulatory. CBG 186

## 2022-05-02 NOTE — ED Notes (Signed)
Lab called to add on troponin 

## 2022-05-02 NOTE — ED Provider Notes (Signed)
[facility] Provider Note    Event Date/Time   First MD Initiated Contact with Patient 05/02/22 1809     (approximate)   History   Aphasia   HPI  Jared Ball is a 89 y.o. male   Past medical history of CAD, atrial fibrillation on amiodarone not on anticoagulation, diabetes, history of carotid stent placement in 2022 on the left side, on dual antiplatelet therapy who presents to the emergency department with dizziness and slurred speech that is transient.  He lives at a care facility and an aide was helping him to get up to get ready for shower, when he stood up he felt dizzy, lightheaded, and had slurred speech, sat back down and rested after which his symptoms resolved.  He reports no other acute medical complaints but had a similar episode earlier this week that was evaluated in the emergency department negative workup and discharged.  He did not fall or lose consciousness or sustain any injuries.  He denied chest pain, shortness of breath, palpitations.  He is able to stand in the room without recurrence of symptoms.  He has no focal neurologic deficits.   Independent Historian contributed to assessment above: His care facility aide who is at bedside to corroborate information given above  External Medical Documents Reviewed: Internal medicine visit dated 04/27/2022 reviewing his past medical history and noting atrial fibrillation "thought to be in NSR mainly so no AC, sees Dr. Juliann Pares"      Physical Exam   Triage Vital Signs: ED Triage Vitals  Enc Vitals Group     BP 05/02/22 1527 137/71     Pulse Rate 05/02/22 1527 64     Resp 05/02/22 1527 20     Temp 05/02/22 1527 98.3 F (36.8 C)     Temp Source 05/02/22 1527 Oral     SpO2 05/02/22 1527 94 %     Weight --      Height --      Head Circumference --      Peak Flow --      Pain Score 05/02/22 1525 0     Pain Loc --      Pain Edu? --      Excl. in GC? --     Most recent vital  signs: Vitals:   05/02/22 1527  BP: 137/71  Pulse: 64  Resp: 20  Temp: 98.3 F (36.8 C)  SpO2: 94%    General: Awake, no distress.  CV:  Good peripheral perfusion.  Resp:  Normal effort.  Abd:  No distention.  Other:  Awake alert comfortable appearing with no acute distress normal vital signs lungs clear abdomen soft and nontender skin appears warm well-perfused.  Neck supple full range of motion no focal neurologic deficits including motor or sensory deficits, facial asymmetry, dysarthria, and he is able to stand and take several steps with a steady gait, normal finger-nose.   ED Results / Procedures / Treatments   Labs (all labs ordered are listed, but only abnormal results are displayed) Labs Reviewed  COMPREHENSIVE METABOLIC PANEL - Abnormal; Notable for the following components:      Result Value   Glucose, Bld 157 (*)    BUN 25 (*)    Creatinine, Ser 1.40 (*)    GFR, Estimated 48 (*)    All other components within normal limits  CBC WITH DIFFERENTIAL/PLATELET - Abnormal; Notable for the following components:   Monocytes Absolute 1.4 (*)    All other  components within normal limits  TROPONIN I (HIGH SENSITIVITY) - Abnormal; Notable for the following components:   Troponin I (High Sensitivity) 28 (*)    All other components within normal limits  PROTIME-INR  APTT  ETHANOL  TROPONIN I (HIGH SENSITIVITY)     I ordered and reviewed the above labs they are notable for his creatinine appears to be at baseline at 1.4, compared to [facility] in March 2023 at 1.4  EKG  ED ECG REPORT I, Pilar Jarvis, the attending physician, personally viewed and interpreted this ECG.   Date: 05/02/2022  EKG Time: 1559  Rate: 63  Rhythm: nsr  Axis: nl  Intervals: Nonspecific intraventricular conduction delay  ST&T Change: No STEMI    RADIOLOGY I independently reviewed and interpreted CT of the head see no obvious bleeding or midline shift   PROCEDURES:  Critical Care performed:  No  Procedures   MEDICATIONS ORDERED IN ED: Medications  sodium chloride flush (NS) 0.9 % injection 3 mL ( Intravenous Canceled Entry 05/02/22 1528)  sodium chloride 0.9 % bolus 1,000 mL (has no administration in time range)    External physician / consultants:  I spoke with Dr. Amada Jupiter of neurology regarding care plan for this patient.   IMPRESSION / MDM / ASSESSMENT AND PLAN / ED COURSE  I reviewed the triage vital signs and the nursing notes.                                Patient's presentation is most consistent with acute presentation with potential threat to life or bodily function.  Differential diagnosis includes, but is not limited to, orthostatic lightheadedness, dehydration, electrolyte disturbance, TIA/stroke   The patient is on the cardiac monitor to evaluate for evidence of arrhythmia and/or significant heart rate changes.  MDM: Is a patient with orthostatic lightheadedness and slurred speech now with 2 episodes in the last 1 week.  Most likely dehydration/orthostatic symptoms perhaps vertebrobasilar insufficiency given history of carotid stenosis status post stent, will check orthostatics give fluid hydration.  CT of the head negative for acute bleed or signs of stroke, meningioma noted but history of meningioma noted on his problem list dated 2021.  Doubt this \\contribute  to his symptoms most recently.  He is already on dual antiplatelet therapy and not anticoagulated for A-fib per his internal medicine doctor note.  I considered TIA/stroke but given transient nature of symptoms I doubt he is having a stroke right now.  I discussed with Dr. Amada Jupiter about the possibility of TIA, however given his symptoms it is unlikely TIA and more likely orthostasis given lightheadedness only upon standing and slurred speech is very nonspecific.  Check CT angiogram for any vascular insufficiency which may be exacerbated by dehydration/orthostatic hypotension, give  fluids.  -- Patient is now asymptomatic and after workup is performed without any emergent findings I think he can be discharged with close follow-up to PMD.         FINAL CLINICAL IMPRESSION(S) / ED DIAGNOSES   Final diagnoses:  Orthostatic lightheadedness  Slurred speech     Rx / DC Orders   ED Discharge Orders     None        Note:  This document was prepared using Dragon voice recognition software and may include unintentional dictation errors.    Pilar Jarvis, MD 05/02/22 2008

## 2022-05-19 ENCOUNTER — Other Ambulatory Visit: Payer: Self-pay

## 2022-06-01 ENCOUNTER — Telehealth: Payer: Self-pay | Admitting: Urology

## 2022-06-01 DIAGNOSIS — Z87442 Personal history of urinary calculi: Secondary | ICD-10-CM

## 2022-06-01 MED ORDER — TAMSULOSIN HCL 0.4 MG PO CAPS: 0.4000 mg | ORAL_CAPSULE | Freq: Every day | ORAL | 0 refills | Status: DC

## 2022-06-01 MED ORDER — FINASTERIDE 5 MG PO TABS
5.0000 mg | ORAL_TABLET | Freq: Every day | ORAL | 0 refills | Status: DC
Start: 2022-06-01 — End: 2022-06-05

## 2022-06-01 NOTE — Telephone Encounter (Signed)
Pt r/s appt for 7/16 @ 1:30.  He has been in and out of the hospital and didn't feel up to coming in.  He is very weak and needs to build strengh back up before coming in.  He would like to get enough meds (finasteride & tamsulosin) to last til this appt sent to [facility] on [address].

## 2022-06-01 NOTE — Telephone Encounter (Signed)
Rx sent to pharmacy   

## 2022-06-02 ENCOUNTER — Other Ambulatory Visit: Payer: Self-pay

## 2022-06-02 ENCOUNTER — Encounter: Admission: EM | Disposition: E | Payer: Self-pay | Source: Home / Self Care | Attending: Internal Medicine

## 2022-06-02 ENCOUNTER — Emergency Department: Payer: Medicare HMO

## 2022-06-02 ENCOUNTER — Ambulatory Visit: Payer: Medicare HMO | Admitting: Urology

## 2022-06-02 ENCOUNTER — Encounter: Payer: Self-pay | Admitting: *Deleted

## 2022-06-02 DIAGNOSIS — Z91048 Other nonmedicinal substance allergy status: Secondary | ICD-10-CM

## 2022-06-02 DIAGNOSIS — E559 Vitamin D deficiency, unspecified: Secondary | ICD-10-CM | POA: Diagnosis present

## 2022-06-02 DIAGNOSIS — J9601 Acute respiratory failure with hypoxia: Principal | ICD-10-CM

## 2022-06-02 DIAGNOSIS — Z515 Encounter for palliative care: Secondary | ICD-10-CM | POA: Diagnosis not present

## 2022-06-02 DIAGNOSIS — R55 Syncope and collapse: Secondary | ICD-10-CM

## 2022-06-02 DIAGNOSIS — E7800 Pure hypercholesterolemia, unspecified: Secondary | ICD-10-CM | POA: Diagnosis present

## 2022-06-02 DIAGNOSIS — Z1152 Encounter for screening for COVID-19: Secondary | ICD-10-CM | POA: Diagnosis not present

## 2022-06-02 DIAGNOSIS — Z6833 Body mass index (BMI) 33.0-33.9, adult: Secondary | ICD-10-CM

## 2022-06-02 DIAGNOSIS — I2510 Atherosclerotic heart disease of native coronary artery without angina pectoris: Secondary | ICD-10-CM | POA: Diagnosis present

## 2022-06-02 DIAGNOSIS — I480 Paroxysmal atrial fibrillation: Secondary | ICD-10-CM | POA: Diagnosis present

## 2022-06-02 DIAGNOSIS — Z66 Do not resuscitate: Secondary | ICD-10-CM | POA: Diagnosis present

## 2022-06-02 DIAGNOSIS — F419 Anxiety disorder, unspecified: Secondary | ICD-10-CM | POA: Diagnosis present

## 2022-06-02 DIAGNOSIS — E669 Obesity, unspecified: Secondary | ICD-10-CM | POA: Diagnosis present

## 2022-06-02 DIAGNOSIS — I213 ST elevation (STEMI) myocardial infarction of unspecified site: Secondary | ICD-10-CM | POA: Diagnosis present

## 2022-06-02 DIAGNOSIS — Z7982 Long term (current) use of aspirin: Secondary | ICD-10-CM | POA: Diagnosis not present

## 2022-06-02 DIAGNOSIS — N400 Enlarged prostate without lower urinary tract symptoms: Secondary | ICD-10-CM | POA: Diagnosis present

## 2022-06-02 DIAGNOSIS — Z841 Family history of disorders of kidney and ureter: Secondary | ICD-10-CM | POA: Diagnosis not present

## 2022-06-02 DIAGNOSIS — Z87891 Personal history of nicotine dependence: Secondary | ICD-10-CM

## 2022-06-02 DIAGNOSIS — I214 Non-ST elevation (NSTEMI) myocardial infarction: Secondary | ICD-10-CM | POA: Diagnosis present

## 2022-06-02 DIAGNOSIS — G4733 Obstructive sleep apnea (adult) (pediatric): Secondary | ICD-10-CM | POA: Diagnosis present

## 2022-06-02 DIAGNOSIS — R319 Hematuria, unspecified: Secondary | ICD-10-CM | POA: Diagnosis not present

## 2022-06-02 DIAGNOSIS — R570 Cardiogenic shock: Principal | ICD-10-CM | POA: Diagnosis present

## 2022-06-02 DIAGNOSIS — Z79899 Other long term (current) drug therapy: Secondary | ICD-10-CM

## 2022-06-02 DIAGNOSIS — N1831 Chronic kidney disease, stage 3a: Secondary | ICD-10-CM | POA: Diagnosis present

## 2022-06-02 DIAGNOSIS — Z9842 Cataract extraction status, left eye: Secondary | ICD-10-CM

## 2022-06-02 DIAGNOSIS — G9341 Metabolic encephalopathy: Secondary | ICD-10-CM | POA: Diagnosis not present

## 2022-06-02 DIAGNOSIS — E872 Acidosis, unspecified: Secondary | ICD-10-CM | POA: Diagnosis present

## 2022-06-02 DIAGNOSIS — N179 Acute kidney failure, unspecified: Secondary | ICD-10-CM | POA: Diagnosis not present

## 2022-06-02 DIAGNOSIS — Z7985 Long-term (current) use of injectable non-insulin antidiabetic drugs: Secondary | ICD-10-CM

## 2022-06-02 DIAGNOSIS — E1165 Type 2 diabetes mellitus with hyperglycemia: Secondary | ICD-10-CM | POA: Diagnosis present

## 2022-06-02 DIAGNOSIS — I129 Hypertensive chronic kidney disease with stage 1 through stage 4 chronic kidney disease, or unspecified chronic kidney disease: Secondary | ICD-10-CM | POA: Diagnosis present

## 2022-06-02 DIAGNOSIS — Z7902 Long term (current) use of antithrombotics/antiplatelets: Secondary | ICD-10-CM

## 2022-06-02 DIAGNOSIS — R9431 Abnormal electrocardiogram [ECG] [EKG]: Secondary | ICD-10-CM | POA: Diagnosis not present

## 2022-06-02 DIAGNOSIS — Z882 Allergy status to sulfonamides status: Secondary | ICD-10-CM

## 2022-06-02 DIAGNOSIS — Z7984 Long term (current) use of oral hypoglycemic drugs: Secondary | ICD-10-CM | POA: Diagnosis not present

## 2022-06-02 DIAGNOSIS — Z9841 Cataract extraction status, right eye: Secondary | ICD-10-CM

## 2022-06-02 DIAGNOSIS — E1122 Type 2 diabetes mellitus with diabetic chronic kidney disease: Secondary | ICD-10-CM | POA: Diagnosis present

## 2022-06-02 DIAGNOSIS — R001 Bradycardia, unspecified: Secondary | ICD-10-CM

## 2022-06-02 DIAGNOSIS — Z888 Allergy status to other drugs, medicaments and biological substances status: Secondary | ICD-10-CM

## 2022-06-02 DIAGNOSIS — K219 Gastro-esophageal reflux disease without esophagitis: Secondary | ICD-10-CM | POA: Diagnosis present

## 2022-06-02 DIAGNOSIS — K589 Irritable bowel syndrome without diarrhea: Secondary | ICD-10-CM | POA: Diagnosis present

## 2022-06-02 DIAGNOSIS — Z961 Presence of intraocular lens: Secondary | ICD-10-CM | POA: Diagnosis present

## 2022-06-02 DIAGNOSIS — I9589 Other hypotension: Secondary | ICD-10-CM | POA: Diagnosis not present

## 2022-06-02 HISTORY — PX: TEMPORARY PACEMAKER: CATH118268

## 2022-06-02 LAB — BRAIN NATRIURETIC PEPTIDE: B Natriuretic Peptide: 687.4 pg/mL — ABNORMAL HIGH (ref 0.0–100.0)

## 2022-06-02 LAB — CBC
HCT: 44.4 % (ref 39.0–52.0)
Hemoglobin: 13.9 g/dL (ref 13.0–17.0)
MCH: 28.5 pg (ref 26.0–34.0)
MCHC: 31.3 g/dL (ref 30.0–36.0)
MCV: 91 fL (ref 80.0–100.0)
Platelets: 194 10*3/uL (ref 150–400)
RBC: 4.88 MIL/uL (ref 4.22–5.81)
RDW: 14.6 % (ref 11.5–15.5)
WBC: 8.9 10*3/uL (ref 4.0–10.5)
nRBC: 0 % (ref 0.0–0.2)

## 2022-06-02 LAB — PHOSPHORUS: Phosphorus: 4.4 mg/dL (ref 2.5–4.6)

## 2022-06-02 LAB — BLOOD GAS, ARTERIAL
Acid-Base Excess: 0.9 mmol/L (ref 0.0–2.0)
Bicarbonate: 25.3 mmol/L (ref 20.0–28.0)
FIO2: 0.35 %
MECHVT: 500 mL
O2 Saturation: 92.5 %
PEEP: 5 cmH2O
Patient temperature: 37
RATE: 16 resp/min
pCO2 arterial: 39 mmHg (ref 32–48)
pH, Arterial: 7.42 (ref 7.35–7.45)
pO2, Arterial: 67 mmHg — ABNORMAL LOW (ref 83–108)

## 2022-06-02 LAB — SARS CORONAVIRUS 2 BY RT PCR: SARS Coronavirus 2 by RT PCR: NEGATIVE

## 2022-06-02 LAB — HEPATIC FUNCTION PANEL
ALT: 41 U/L (ref 0–44)
AST: 58 U/L — ABNORMAL HIGH (ref 15–41)
Albumin: 3.4 g/dL — ABNORMAL LOW (ref 3.5–5.0)
Alkaline Phosphatase: 73 U/L (ref 38–126)
Bilirubin, Direct: 0.4 mg/dL — ABNORMAL HIGH (ref 0.0–0.2)
Indirect Bilirubin: 1.1 mg/dL — ABNORMAL HIGH (ref 0.3–0.9)
Total Bilirubin: 1.5 mg/dL — ABNORMAL HIGH (ref 0.3–1.2)
Total Protein: 6.4 g/dL — ABNORMAL LOW (ref 6.5–8.1)

## 2022-06-02 LAB — BASIC METABOLIC PANEL
Anion gap: 12 (ref 5–15)
BUN: 21 mg/dL (ref 8–23)
CO2: 17 mmol/L — ABNORMAL LOW (ref 22–32)
Calcium: 8.1 mg/dL — ABNORMAL LOW (ref 8.9–10.3)
Chloride: 106 mmol/L (ref 98–111)
Creatinine, Ser: 1.47 mg/dL — ABNORMAL HIGH (ref 0.61–1.24)
GFR, Estimated: 46 mL/min — ABNORMAL LOW (ref >60)
Glucose, Bld: 307 mg/dL — ABNORMAL HIGH (ref 70–99)
Potassium: 3.8 mmol/L (ref 3.5–5.1)
Sodium: 135 mmol/L (ref 135–145)

## 2022-06-02 LAB — TROPONIN I (HIGH SENSITIVITY)
Troponin I (High Sensitivity): 29 ng/L — ABNORMAL HIGH (ref <18)
Troponin I (High Sensitivity): 336 ng/L (ref <18)

## 2022-06-02 LAB — MAGNESIUM
Magnesium: 2.1 mg/dL (ref 1.7–2.4)
Magnesium: 2.2 mg/dL (ref 1.7–2.4)

## 2022-06-02 LAB — URINALYSIS, ROUTINE W REFLEX MICROSCOPIC
Bilirubin Urine: NEGATIVE
Glucose, UA: 50 mg/dL — AB
Ketones, ur: NEGATIVE mg/dL
Leukocytes,Ua: NEGATIVE
Nitrite: NEGATIVE
Protein, ur: 100 mg/dL — AB
Specific Gravity, Urine: 1.012 (ref 1.005–1.030)
pH: 5 (ref 5.0–8.0)

## 2022-06-02 LAB — MRSA NEXT GEN BY PCR, NASAL: MRSA by PCR Next Gen: NOT DETECTED

## 2022-06-02 LAB — GLUCOSE, CAPILLARY
Glucose-Capillary: 129 mg/dL — ABNORMAL HIGH (ref 70–99)
Glucose-Capillary: 212 mg/dL — ABNORMAL HIGH (ref 70–99)
Glucose-Capillary: 288 mg/dL — ABNORMAL HIGH (ref 70–99)

## 2022-06-02 LAB — LACTIC ACID, PLASMA
Lactic Acid, Venous: 1.6 mmol/L (ref 0.5–1.9)
Lactic Acid, Venous: 2.2 mmol/L (ref 0.5–1.9)

## 2022-06-02 LAB — T4, FREE: Free T4: 1.43 ng/dL — ABNORMAL HIGH (ref 0.61–1.12)

## 2022-06-02 LAB — HEMOGLOBIN A1C
Hgb A1c MFr Bld: 7.7 % — ABNORMAL HIGH (ref 4.8–5.6)
Mean Plasma Glucose: 174.29 mg/dL

## 2022-06-02 LAB — TSH: TSH: 9.31 u[IU]/mL — ABNORMAL HIGH (ref 0.350–4.500)

## 2022-06-02 SURGERY — TEMPORARY PACEMAKER

## 2022-06-02 MED ORDER — FENTANYL BOLUS VIA INFUSION
25.0000 ug | INTRAVENOUS | Status: DC | PRN
  Administered 2022-06-03 (×2): 25 ug via INTRAVENOUS

## 2022-06-02 MED ORDER — FENTANYL 2500MCG IN NS 250ML (10MCG/ML) PREMIX INFUSION: 25.0000 ug/h | INTRAVENOUS | Status: DC

## 2022-06-02 MED ORDER — NOREPINEPHRINE 4 MG/250ML-% IV SOLN
INTRAVENOUS | Status: AC
  Administered 2022-06-02: 4 ug/min via INTRAVENOUS
  Filled 2022-06-02: qty 250

## 2022-06-02 MED ORDER — ACETAMINOPHEN 325 MG PO TABS
650.0000 mg | ORAL_TABLET | ORAL | Status: DC | PRN
  Administered 2022-06-03: 650 mg via ORAL
  Filled 2022-06-02: qty 2

## 2022-06-02 MED ORDER — ORAL CARE MOUTH RINSE: 15.0000 mL | OROMUCOSAL | Status: DC | PRN

## 2022-06-02 MED ORDER — DOCUSATE SODIUM 50 MG/5ML PO LIQD
100.0000 mg | Freq: Two times a day (BID) | ORAL | Status: DC
  Administered 2022-06-02 – 2022-06-03 (×2): 100 mg
  Filled 2022-06-02 (×2): qty 10

## 2022-06-02 MED ORDER — CHLORHEXIDINE GLUCONATE CLOTH 2 % EX PADS
6.0000 | MEDICATED_PAD | Freq: Every day | CUTANEOUS | Status: DC
  Administered 2022-06-02: 6 via TOPICAL

## 2022-06-02 MED ORDER — HYDRALAZINE HCL 20 MG/ML IJ SOLN
10.0000 mg | INTRAMUSCULAR | Status: AC | PRN
  Administered 2022-06-02: 10 mg via INTRAVENOUS
  Filled 2022-06-02: qty 1

## 2022-06-02 MED ORDER — CHLORHEXIDINE GLUCONATE CLOTH 2 % EX PADS
6.0000 | MEDICATED_PAD | Freq: Every day | CUTANEOUS | Status: DC
  Administered 2022-06-03: 6 via TOPICAL

## 2022-06-02 MED ORDER — NALOXONE HCL 2 MG/2ML IJ SOSY
PREFILLED_SYRINGE | INTRAMUSCULAR | Status: AC
  Administered 2022-06-02: 1 mg
  Filled 2022-06-02: qty 2

## 2022-06-02 MED ORDER — NOREPINEPHRINE 4 MG/250ML-% IV SOLN: 0.0000 ug/min | INTRAVENOUS | Status: DC

## 2022-06-02 MED ORDER — ACETAMINOPHEN 325 MG PO TABS
650.0000 mg | ORAL_TABLET | ORAL | Status: DC | PRN
  Administered 2022-06-02: 650 mg
  Filled 2022-06-02: qty 2

## 2022-06-02 MED ORDER — DOCUSATE SODIUM 100 MG PO CAPS: 100.0000 mg | ORAL_CAPSULE | Freq: Two times a day (BID) | ORAL | Status: DC | PRN

## 2022-06-02 MED ORDER — POLYETHYLENE GLYCOL 3350 17 G PO PACK: 17.0000 g | PACK | Freq: Every day | ORAL | Status: DC | PRN

## 2022-06-02 MED ORDER — FENTANYL CITRATE PF 50 MCG/ML IJ SOSY
25.0000 ug | PREFILLED_SYRINGE | Freq: Once | INTRAMUSCULAR | Status: AC
  Administered 2022-06-02: 25 ug via INTRAVENOUS
  Filled 2022-06-02: qty 1

## 2022-06-02 MED ORDER — ORAL CARE MOUTH RINSE
15.0000 mL | OROMUCOSAL | Status: DC
  Administered 2022-06-02 – 2022-06-03 (×5): 15 mL via OROMUCOSAL

## 2022-06-02 MED ORDER — LABETALOL HCL 5 MG/ML IV SOLN: 10.0000 mg | INTRAVENOUS | Status: AC | PRN

## 2022-06-02 MED ORDER — ATROPINE SULFATE 1 MG/10ML IJ SOSY
0.5000 mg | PREFILLED_SYRINGE | Freq: Once | INTRAMUSCULAR | Status: AC
  Administered 2022-06-02: 0.5 mg via INTRAVENOUS

## 2022-06-02 MED ORDER — SODIUM CHLORIDE 0.9% FLUSH
3.0000 mL | Freq: Two times a day (BID) | INTRAVENOUS | Status: DC
  Administered 2022-06-02 – 2022-06-04 (×4): 3 mL via INTRAVENOUS

## 2022-06-02 MED ORDER — SODIUM CHLORIDE 0.9 % IV SOLN: 250.0000 mL | INTRAVENOUS | Status: DC | PRN

## 2022-06-02 MED ORDER — SODIUM CHLORIDE 0.9 % IV SOLN: Freq: Once | INTRAVENOUS | Status: AC

## 2022-06-02 MED ORDER — SODIUM CHLORIDE 0.9 % IV SOLN
250.0000 mL | INTRAVENOUS | Status: DC | PRN
  Administered 2022-06-03: 250 mL via INTRAVENOUS

## 2022-06-02 MED ORDER — ONDANSETRON HCL 4 MG/2ML IJ SOLN: 4.0000 mg | Freq: Four times a day (QID) | INTRAMUSCULAR | Status: DC | PRN

## 2022-06-02 MED ORDER — POLYETHYLENE GLYCOL 3350 17 G PO PACK: 17.0000 g | PACK | Freq: Every day | ORAL | Status: DC

## 2022-06-02 MED ORDER — SODIUM CHLORIDE 0.9% FLUSH: 3.0000 mL | INTRAVENOUS | Status: DC | PRN

## 2022-06-02 MED ORDER — NOREPINEPHRINE 4 MG/250ML-% IV SOLN
0.0000 ug/min | INTRAVENOUS | Status: DC
  Filled 2022-06-02: qty 250

## 2022-06-02 MED ORDER — FENTANYL CITRATE PF 50 MCG/ML IJ SOSY: 25.0000 ug | PREFILLED_SYRINGE | Freq: Once | INTRAMUSCULAR | Status: DC

## 2022-06-02 MED ORDER — FAMOTIDINE 20 MG PO TABS
20.0000 mg | ORAL_TABLET | Freq: Two times a day (BID) | ORAL | Status: DC
  Administered 2022-06-02 – 2022-06-03 (×2): 20 mg
  Filled 2022-06-02 (×2): qty 1

## 2022-06-02 MED ORDER — MIDAZOLAM HCL 2 MG/2ML IJ SOLN
2.0000 mg | Freq: Once | INTRAMUSCULAR | Status: AC
  Administered 2022-06-02: 2 mg via INTRAVENOUS
  Filled 2022-06-02: qty 2

## 2022-06-02 MED ORDER — ATROPINE SULFATE 1 MG/10ML IJ SOSY
PREFILLED_SYRINGE | INTRAMUSCULAR | Status: AC
  Filled 2022-06-02: qty 10

## 2022-06-02 MED ORDER — INSULIN ASPART 100 UNIT/ML IJ SOLN
0.0000 [IU] | INTRAMUSCULAR | Status: DC
  Administered 2022-06-02: 1 [IU] via SUBCUTANEOUS
  Administered 2022-06-02: 3 [IU] via SUBCUTANEOUS
  Administered 2022-06-02: 5 [IU] via SUBCUTANEOUS
  Administered 2022-06-03 (×3): 2 [IU] via SUBCUTANEOUS
  Filled 2022-06-02 (×6): qty 1

## 2022-06-02 MED ORDER — KETAMINE HCL 50 MG/5ML IJ SOSY
PREFILLED_SYRINGE | INTRAMUSCULAR | Status: AC
  Administered 2022-06-02: 100 mg
  Filled 2022-06-02: qty 10

## 2022-06-02 MED ORDER — ROCURONIUM BROMIDE 10 MG/ML (PF) SYRINGE
PREFILLED_SYRINGE | INTRAVENOUS | Status: AC
  Administered 2022-06-02: 100 mg
  Filled 2022-06-02: qty 10

## 2022-06-02 MED ORDER — FENTANYL 2500MCG IN NS 250ML (10MCG/ML) PREMIX INFUSION
INTRAVENOUS | Status: AC
  Administered 2022-06-02: 25 ug/h
  Filled 2022-06-02: qty 250

## 2022-06-02 MED ORDER — NOREPINEPHRINE 4 MG/250ML-% IV SOLN: 2.0000 ug/min | INTRAVENOUS | Status: DC

## 2022-06-02 SURGICAL SUPPLY — 11 items
BIOPATCH RED 1 DISK 7.0 (GAUZE/BANDAGES/DRESSINGS) IMPLANT
CABLE ADAPT PACING TEMP 12FT (ADAPTER) IMPLANT
CATH S G BIP PACING (CATHETERS) IMPLANT
NDL PERC 18GX7CM (NEEDLE) IMPLANT
NEEDLE PERC 18GX7CM (NEEDLE) ×1 IMPLANT
PACK CARDIAC CATH (CUSTOM PROCEDURE TRAY) ×1 IMPLANT
PROTECTION STATION PRESSURIZED (MISCELLANEOUS) ×1
SHEATH AVANTI 6FR X 11CM (SHEATH) IMPLANT
SLEEVE REPOSITIONING LENGTH 30 (MISCELLANEOUS) IMPLANT
STATION PROTECTION PRESSURIZED (MISCELLANEOUS) IMPLANT
SUT SILK 0 FSL (SUTURE) IMPLANT

## 2022-06-02 NOTE — ED Notes (Signed)
Pt is becomes unresponsive, EDP at the bedside.  Spo2 lower, RT called and pt bagged.

## 2022-06-02 NOTE — ED Notes (Signed)
Pt was placed on zoll pads on arrival to room due to bradycardia and syncope x3 pta.  After medication was given pt was paced at 60bpm (amp 100 to obtain capture).

## 2022-06-02 NOTE — CV Procedure (Signed)
Temp.  Pacer Indication symptomatic bradycardia hypotension Patient intubated sedated because of respiratory failure External pacer in place with capture Hypotension systolic is 70/43 heart rate paced at 70  Patient brought to the cardiac Cath Lab for insertion of a temporary pacemaker 6 French sheath was placed in the right femoral vein on ultrasound guidance without difficulty 5 French pacer was inserted into the right ventricle without difficulty and capture was obtained Patient pacer was set at a rate of 60 we lost capture at 0.4 Currently pacer is set at 60 sensitivity to output of 2 but pacer was turned off as his intrinsic rhythm seem to improve to sinus rhythm of around 75 blood pressure also improved patient still intubated sedated patient was left in place but on the off position and transferred to intensive care unit  Care is to be assumed by ICU staff  Cardiology will continue to follow Will make final determination about need for permanent pacemaker within the next 24 to 48 hours  Do not recommend cardiac cath or any other invasive procedures at this stage  Patient has known multivessel coronary disease was offered coronary bypass surgery several years ago and refused and has been treated medically  [facility] Cardiology

## 2022-06-02 NOTE — ED Notes (Signed)
Pt paced at 60bpm with 100amp to obtain capture.  EDP at the bedside

## 2022-06-02 NOTE — ED Notes (Signed)
Intubation by Dr Rosalia Hammers, pt tolerated well.

## 2022-06-02 NOTE — ED Notes (Signed)
Call to lab to add labs that were ordered to blood already sent to lab

## 2022-06-02 NOTE — Progress Notes (Signed)
This Chap with my colleague Shauna Hugh visited the Pt in response to code STEMI. Pt being evaluated by interdisciplinary team. Pt Wife in the room. She narrated the life events that lead them to the hospital. She expressed faith and assurance of divine help through it all. Mirna Mires offered active listening as well as compassionate response. Please page the Mirna Mires if need be.   06/02/22 1600  Spiritual Encounters  Type of Visit Initial  Care provided to: Pt and family  Conversation partners present during encounter Nurse  Referral source Code page  Reason for visit Code  OnCall Visit Yes  Interventions  Spiritual Care Interventions Made Narrative/life review;Compassionate presence;Mindfulness intervention  Intervention Outcomes  Outcomes Awareness of support;Reduced isolation  Spiritual Care Plan  Spiritual Care Issues Still Outstanding No further spiritual care needs at this time (see row info)

## 2022-06-02 NOTE — Consult Note (Signed)
PHARMACY CONSULT NOTE  Pharmacy Consult for Electrolyte Monitoring and Replacement   Recent Labs: Potassium (mmol/L)  Date Value  06/02/2022 3.8  01/01/2014 4.3   Magnesium (mg/dL)  Date Value  41/32/4401 2.1   Calcium (mg/dL)  Date Value  02/72/5366 8.1 (L)   Calcium, Total (mg/dL)  Date Value  44/03/4740 9.2   Albumin (g/dL)  Date Value  59/56/3875 3.7   Sodium (mmol/L)  Date Value  06/02/2022 135  01/01/2014 138   Assessment: 89 y/o M with medical history including Afib, HTN, HLD, DM, CKD [facility] due to syncopal episodes. Code STEMI called. Patient became unresponsive in the ED requiring intubation. Patient is hemodynamically unstable requiring vasopressors. Pharmacy consulted to assist with electrolyte monitoring and replacement as indicated.  Goal of Therapy:  Electrolytes within normal limits  Plan:  --No electrolyte replacement indicated at this time --Follow-up electrolytes with AM labs tomorrow  Tressie Ellis 06/02/2022 4:41 PM

## 2022-06-02 NOTE — H&P (Cosign Needed Addendum)
NAME:  Jared Ball, MRN:  [id number], DOB:  January 17, 1932, LOS: 0 ADMISSION DATE:  06/02/2022, CONSULTATION DATE: 06/02/2022 REFERRING MD: Dr. Rosalia Hammers, CHIEF COMPLAINT: Loss of Consciousness and Bradycardia    History of Present Illness:  This is an 89 yo male who presented to [facility] ER on 05/21 via EMS from [facility] for evaluation following multiple syncopal episodes.  He had 2 syncopal episodes that was witnessed by his home health aide who lowered him to the ground.  He was later helped into a sitting position, but had a 3rd syncopal episode and had to be laid back on the ground prompting EMS notification.  Upon EMS arrival pts initial bp was 120/90 with a hr of 58.  However, upon standing pt had another syncopal episode witnessed by EMS his hr dropped to 38 and he became hypotensive bp 60/40.    ED Course  Upon arrival to the ER pt alert/oriented but reported he felt like he was going to pass out.  Initial vital signs: hr 30's/sbp 62-90/respiratory rate 17.  Pt subsequently became unresponsive and hypoxic requiring mechanical intubation.  Pt requiring levophed gtt to maintain map >65.  EKG revealed sinus bradycardia with a prolonged PR interval/heart rate 38.  Cardiologist Dr. Juliann Pares consulted and pt emergently transported to the cardiac cath lab.  PCCM contacted for ICU admission.  ER lab results: CO2 17/glucose 307/creatinine 1.47/calcium 8.1/BNP 687.4/TSH 9.310  CXR: The endotracheal tube is 4.3 cm above the carina. NG tube in good position. No acute cardiopulmonary findings.  Pertinent  Medical History  Anxiety  Arthritis  Atrial Fibrillation  BPH Bronchitis  Chronic Kidney Disease  Cognitive Changes  Type II Diabetes Mellitus  Dysrhythmia  GERD  Headache HOH Hypercholesteremia HTN IBS OSA (does not wear CPAP) Spinal Stenosis  Vertigo  Vitamin D Deficiency    Significant Hospital Events: Including procedures, antibiotic start and stop dates in addition to other  pertinent events   05/21: Pt admitted with syncope secondary to cardiogenic shock requiring mechanical intubation and temporary transvenous pacemaker placement   Interim History / Subjective:  Pt remains mechanically intubated sedated unable to follow commands.  BP and hr currently stable. Temporary transvenous pacemaker in place but currently off   Objective   Blood pressure (!) 72/43, pulse (!) 56, resp. rate 19, weight 105.2 kg, SpO2 95 %.       No intake or output data in the 24 hours ending 06/02/22 1645 Filed Weights   06/02/22 1500  Weight: 105.2 kg    Examination: General: Acutely-ill appearing male, NAD mechanically intubated  HENT: Supple, no JVD  Lungs: Faint rhonchi throughout, even, non labored  Cardiovascular: NSR, s1s2, no m/r/g, 2+ radial/1+ distal pulses, no edema  Abdomen: +BS x4, non tender, non distended  Extremities: Normal bulk and tone Neuro: Sedated, not following commands, PERRL GU: External catheter in place   Resolved Hospital Problem list     Assessment & Plan:  #Cardiogenic shock with symptomatic bradycardia s/p temporary transvenous pacemaker placement  #Syncopal episodes  #Mildly elevated troponin's suspect secondary to demand ischemia  Hx: Atrial fibrillation, HTN, multivessel coronary disease, and hypercholesteremia  - Continuous telemetry monitoring  - Trend troponin's until peaked  - Prn levophed gtt to maintain map >65 - TSH and free T4 results pending  - Avoid AV nodal blocking medications and hold outpatient amiodarone  - Temporary transvenous pacemaker settings: pacer rate 60/sensitivity to output of 2 but pacer turned off post insertion as his intrinsic rhythm improved  to sinus rhythm  - Cardiology consulted appreciate input: no plans for invasive evaluation for ischemia as pt has refused coronary bypass surgery following previous cardiac cath results revealed multivessel disease   #Acute hypoxic respiratory failure  #Mechanical  intubation  Hx: OSA  - Full vent support for now: vent settings reviewed and established  - Continue lung protective strategies  - Goal plateau pressures less than 30 cm H20 - Intermittent CXR and ABG's - VAP bundle implemented  - SBT once all parameters met   #Stage IIIa CKD  #Non anion gap metabolic acidosis  - Trend BMP and ABG  - Replace electrolytes as indicated  - Monitor UOP - Avoid nephrotoxic medications when able   #Type II diabetes mellitus  - Hemoglobin A1c pending - CBG's q4hrs  - SSI   Best Practice (right click and "Reselect all SmartList Selections" daily)  Diet/type: NPO DVT prophylaxis: SCD GI prophylaxis: H2B Lines: Right femoral temporary transvenous pacemaker  Foley:  N/A Code Status:  DNR Last date of multidisciplinary goals of care discussion [N/A]  Labs   CBC: Recent Labs  Lab 06/02/22 1505  WBC 8.9  HGB 13.9  HCT 44.4  MCV 91.0  PLT 194    Basic Metabolic Panel: Recent Labs  Lab 06/02/22 1505  NA 135  K 3.8  CL 106  CO2 17*  GLUCOSE 307*  BUN 21  CREATININE 1.47*  CALCIUM 8.1*  MG 2.1   GFR: CrCl cannot be calculated (Unknown ideal weight.). Recent Labs  Lab 06/02/22 1505  WBC 8.9    Liver Function Tests: No results for input(s): "AST", "ALT", "ALKPHOS", "BILITOT", "PROT", "ALBUMIN" in the last 168 hours. No results for input(s): "LIPASE", "AMYLASE" in the last 168 hours. No results for input(s): "AMMONIA" in the last 168 hours.  ABG No results found for: "PHART", "PCO2ART", "PO2ART", "HCO3", "TCO2", "ACIDBASEDEF", "O2SAT"   Coagulation Profile: No results for input(s): "INR", "PROTIME" in the last 168 hours.  Cardiac Enzymes: No results for input(s): "CKTOTAL", "CKMB", "CKMBINDEX", "TROPONINI" in the last 168 hours.  HbA1C: Hgb A1c MFr Bld  Date/Time Value Ref Range Status  11/26/2020 11:14 AM 7.2 (H) 4.8 - 5.6 % Final    Comment:    (NOTE) Pre diabetes:          5.7%-6.4%  Diabetes:               >6.4%  Glycemic control for   <7.0% adults with diabetes     CBG: No results for input(s): "GLUCAP" in the last 168 hours.  Review of Systems:   Unable to assess pt mechanically intubated  Past Medical History:  He,  has a past medical history of Anxiety, Arthritis, Atrial fibrillation (HCC), BPH (benign prostatic hyperplasia), Bronchitis, Chronic kidney disease, Cognitive changes, Diabetes mellitus without complication (HCC), Dysrhythmia, GERD (gastroesophageal reflux disease), Headache, History of kidney stones, HOH (hard of hearing), Hypercholesteremia, Hypertension, IBS (irritable bowel syndrome), PONV (postoperative nausea and vomiting), Shingles (15 yrs ago), Shortness of breath dyspnea, Sleep apnea, Spinal stenosis, Vertigo, and Vitamin D deficiency.   Surgical History:   Past Surgical History:  Procedure Laterality Date   CAROTID PTA/STENT INTERVENTION Left 11/26/2020   Procedure: CAROTID PTA/STENT INTERVENTION;  Surgeon: Renford Dills, MD;  Location: [facility];  Service: Cardiovascular;  Laterality: Left;   CATARACT EXTRACTION W/PHACO Left 05/17/2019   Procedure: CATARACT EXTRACTION PHACO AND INTRAOCULAR LENS PLACEMENT (IOC) LEFT DIABETIC MALYUGIN;  Surgeon: Lockie Mola, MD;  Location: [facility];  Service: Ophthalmology;  Laterality: Left;  16.54 1:31.1 18.2%   CATARACT EXTRACTION W/PHACO Right 07/12/2019   Procedure: CATARACT EXTRACTION PHACO AND INTRAOCULAR LENS PLACEMENT (IOC) RIGHT MALYUGIN DIABETIC 14.90 01:37.8 15.3%;  Surgeon: Lockie Mola, MD;  Location: [facility];  Service: Ophthalmology;  Laterality: Right;  Diabetic - oral meds Latex   COLONOSCOPY     CYSTOSCOPY W/ RETROGRADES Bilateral 07/02/2014   Procedure: CYSTOSCOPY WITH RETROGRADE PYELOGRAM;  Surgeon: Vanna Scotland, MD;  Location: [facility];  Service: Urology;  Laterality: Bilateral;   CYSTOSCOPY WITH STENT PLACEMENT Left 07/02/2014   Procedure: CYSTOSCOPY WITH  STENT PLACEMENT;  Surgeon: Vanna Scotland, MD;  Location: [facility];  Service: Urology;  Laterality: Left;   DENTAL SURGERY     in the [employer]   LEFT HEART CATH AND CORONARY ANGIOGRAPHY Left 11/09/2019   Procedure: LEFT HEART CATH AND CORONARY ANGIOGRAPHY with Radial Approach;  Surgeon: Alwyn Pea, MD;  Location: [facility];  Service: Cardiovascular;  Laterality: Left;   TONSILLECTOMY     and adenoids   URETEROSCOPY WITH HOLMIUM LASER LITHOTRIPSY Left 07/02/2014   Procedure: URETEROSCOPY WITH HOLMIUM LASER LITHOTRIPSY;  Surgeon: Vanna Scotland, MD;  Location: [facility];  Service: Urology;  Laterality: Left;     Social History:   reports that he quit smoking about 34 years ago. His smoking use included cigarettes. He has never used smokeless tobacco. He reports current alcohol use. He reports that he does not use drugs.   Family History:  His family history includes Kidney disease in his mother. There is no history of Prostate cancer, Kidney cancer, or Bladder Cancer.   Allergies Allergies  Allergen Reactions   Sulfa Antibiotics Nausea And Vomiting and Other (See Comments)   Tape Rash    adhesive   Metformin Diarrhea    High dose gives diarrhea    Sulfasalazine Nausea Only and Nausea And Vomiting   Zoster Vaccine Live Hives and Rash    Localized; Vaccine for shingles      Home Medications  Prior to Admission medications   Medication Sig Start Date End Date Taking? Authorizing Provider  acetaminophen (TYLENOL) 500 MG tablet Take 500 mg by mouth every 8 (eight) hours as needed for mild pain or moderate pain.    [provider]  amiodarone (PACERONE) 100 MG tablet Take 100 mg by mouth daily. 12/14/19   [provider]  APPLE CIDER VINEGAR PO Take 7 g by mouth once a week.    [provider]  aspirin EC 81 MG tablet Take 81 mg by mouth daily.     [provider]  bisacodyl (DULCOLAX) 5 MG EC tablet Take 5 mg by mouth daily as needed  for moderate constipation.    [provider]  BLACK ELDERBERRY PO Take 300 mg by mouth See admin instructions. Couple times a month    [provider]  calcium elemental as carbonate (TUMS ULTRA 1000) 400 MG chewable tablet Chew 1,000 mg by mouth daily as needed for heartburn.    [provider]  carboxymethylcellulose (REFRESH PLUS) 0.5 % SOLN Place 1 drop into both eyes daily.    [provider]  Cholecalciferol 25 MCG (1000 UT) tablet Take 1,000 Units by mouth daily.  08/28/16   [provider]  clopidogrel (PLAVIX) 75 MG tablet Take 75 mg by mouth daily. 01/18/20   [provider]  Cyanocobalamin 1000 MCG CAPS Take 1,000 mcg by mouth daily. Gummies    [provider]  Dulaglutide (TRULICITY) 1.5 MG/0.5ML SOPN Inject 1.5 mg into the skin once a week. 12/02/21     Dulaglutide (TRULICITY) 1.5 MG/0.5ML SOPN Inject 1.5 mg into the skin once a week. 04/27/22     finasteride (PROSCAR) 5 MG tablet Take 1 tablet (5 mg total) by mouth daily. 06/01/22   Michiel Cowboy A, PA-C  fluticasone (FLONASE) 50 MCG/ACT nasal spray Place 1 spray into the nose 2 (two) times daily as needed for allergies. 04/28/12   [provider]  isosorbide mononitrate (IMDUR) 60 MG 24 hr tablet Take 60 mg by mouth daily.    [provider]  loperamide (IMODIUM A-D) 2 MG tablet Take 2 mg by mouth 3 (three) times daily as needed for diarrhea or loose stools.     [provider]  lovastatin (MEVACOR) 40 MG tablet Take 40 mg by mouth at bedtime.  03/12/16   [provider]  meclizine (ANTIVERT) 12.5 MG tablet Take 12.5 mg by mouth 2 (two) times daily as needed. 04/29/21   [provider]  metFORMIN (GLUCOPHAGE) 500 MG tablet Take 500 mg by mouth at bedtime. 06/09/16   [provider]  nystatin-triamcinolone ointment (MYCOLOG) Apply 1 application. topically 2 (two) times daily. 05/06/21   Michiel Cowboy A, PA-C  pantoprazole  (PROTONIX) 40 MG tablet Take 40 mg by mouth daily.  01/24/18   [provider]  Pramox-PE-Glycerin-Petrolatum (PREPARATION H) 1-0.25-14.4-15 % CREA Apply 1 Applicatorful topically daily as needed (Hemorrhoids).    [provider]  ranolazine (RANEXA) 500 MG 12 hr tablet Take 500 mg by mouth 2 (two) times daily.  03/22/18   [provider]  tamsulosin (FLOMAX) 0.4 MG CAPS capsule Take 1 capsule (0.4 mg total) by mouth daily. 06/01/22   Michiel Cowboy A, PA-C  torsemide (DEMADEX) 5 MG tablet Take 5 mg by mouth daily.  05/29/15   [provider]  triamcinolone ointment (KENALOG) 0.1 % Apply 1 application  topically daily as needed (rash). 09/20/20   [provider]  TRULICITY 0.75 MG/0.5ML SOPN SMARTSIG:0.5 Milliliter(s) SUB-Q Once a Week 08/07/21   [provider]     Critical care time: 65 minutes     Zada Girt, AGNP  Pulmonary/Critical Care Pager [phone] (please enter 7 digits) PCCM Consult Pager [phone] (please enter 7 digits)

## 2022-06-02 NOTE — ED Provider Notes (Signed)
[facility] Provider Note    Event Date/Time   First MD Initiated Contact with Patient 06/02/22 1454     (approximate)   History   Loss of Consciousness and Bradycardia   HPI  Jared Ball is a 89 y.o. male with history of atrial fibrillation on amiodarone, CAD, diabetes presenting to the emergency department for evaluation following a syncopal episode.  Patient reports that over the last several weeks he has had multiple episodes of syncope.  Has been seen in our ER a couple times with reassuring workup.  Today, he went to move from his recliner when he began to feel weak and had 2 witnessed syncopal episodes.  Initial pressure for EMS 120/90 with a heart rate of 58.  Upon standing, heart rate was noted to drop to 38 with a BP of 60/40.  No reported chest pain or shortness of breath.  Patient somnolent but arousable on my initial evaluation.  He tells me that this has happened several times, nothing specifically different about his episode today.  Arrives with a health aide who reports that patient does not have any family.  He does arrive with a goldenrod form.  He confirms DNR status, but they report that he would be okay with transcutaneous pacing if needed, intubation if it was felt that the underlying pathology reversible.  Physical Exam   Triage Vital Signs: ED Triage Vitals  Enc Vitals Group     BP 06/02/22 1505 90/71     Pulse Rate 06/02/22 1515 (!) 33     Resp 06/02/22 1505 11     Temp --      Temp src --      SpO2 06/02/22 1515 95 %     Weight 06/02/22 1500 232 lb (105.2 kg)     Height --      Head Circumference --      Peak Flow --      Pain Score 06/02/22 1500 0     Pain Loc --      Pain Edu? --      Excl. in GC? --     Most recent vital signs: Vitals:   06/02/22 2310 06/03/22 0000  BP:  (!) 141/70  Pulse: 63 (!) 59  Resp: 20 15  Temp:    SpO2: 96% 96%     General: Awake, somnolent but interactive CV:  Bradycardia with heart  rates ranging from the 30s to the 50s Resp:  Bibasilar crackles, respirations not significantly labored abd:   Soft, nondistended.  Neuro:  Symmetric facial movement, slurred speech, requires frequent prompting to be reawakened, but speaking in full sentences with appropriate answers to questions   ED Results / Procedures / Treatments   Labs (all labs ordered are listed, but only abnormal results are displayed) Labs Reviewed  BASIC METABOLIC PANEL - Abnormal; Notable for the following components:      Result Value   CO2 17 (*)    Glucose, Bld 307 (*)    Creatinine, Ser 1.47 (*)    Calcium 8.1 (*)    GFR, Estimated 46 (*)    All other components within normal limits  URINALYSIS, ROUTINE W REFLEX MICROSCOPIC - Abnormal; Notable for the following components:   Color, Urine YELLOW (*)    APPearance HAZY (*)    Glucose, UA 50 (*)    Hgb urine dipstick SMALL (*)    Protein, ur 100 (*)    Bacteria, UA RARE (*)  All other components within normal limits  BRAIN NATRIURETIC PEPTIDE - Abnormal; Notable for the following components:   B Natriuretic Peptide 687.4 (*)    All other components within normal limits  TSH - Abnormal; Notable for the following components:   TSH 9.310 (*)    All other components within normal limits  LACTIC ACID, PLASMA - Abnormal; Notable for the following components:   Lactic Acid, Venous 2.2 (*)    All other components within normal limits  BLOOD GAS, ARTERIAL - Abnormal; Notable for the following components:   pO2, Arterial 67 (*)    All other components within normal limits  T4, FREE - Abnormal; Notable for the following components:   Free T4 1.43 (*)    All other components within normal limits  HEPATIC FUNCTION PANEL - Abnormal; Notable for the following components:   Total Protein 6.4 (*)    Albumin 3.4 (*)    AST 58 (*)    Total Bilirubin 1.5 (*)    Bilirubin, Direct 0.4 (*)    Indirect Bilirubin 1.1 (*)    All other components within normal  limits  HEMOGLOBIN A1C - Abnormal; Notable for the following components:   Hgb A1c MFr Bld 7.7 (*)    All other components within normal limits  GLUCOSE, CAPILLARY - Abnormal; Notable for the following components:   Glucose-Capillary 288 (*)    All other components within normal limits  GLUCOSE, CAPILLARY - Abnormal; Notable for the following components:   Glucose-Capillary 212 (*)    All other components within normal limits  GLUCOSE, CAPILLARY - Abnormal; Notable for the following components:   Glucose-Capillary 129 (*)    All other components within normal limits  TROPONIN I (HIGH SENSITIVITY) - Abnormal; Notable for the following components:   Troponin I (High Sensitivity) 29 (*)    All other components within normal limits  TROPONIN I (HIGH SENSITIVITY) - Abnormal; Notable for the following components:   Troponin I (High Sensitivity) 336 (*)    All other components within normal limits  MRSA NEXT GEN BY PCR, NASAL  SARS CORONAVIRUS 2 BY RT PCR  SARS CORONAVIRUS 2 BY RT PCR  CBC  MAGNESIUM  PHOSPHORUS  LACTIC ACID, PLASMA  MAGNESIUM  BASIC METABOLIC PANEL  MAGNESIUM  LIPOPROTEIN A (LPA)  CBG MONITORING, ED     EKG EKG independently reviewed interpreted by myself (ER attending) demonstrates:  EKG from 1507 demonstrates sinus bradycardia with a significantly prolonged PR interval at 463, QRS 132, QTc 474, no acute ST changes  RADIOLOGY Imaging independently reviewed and interpreted by myself demonstrates:  Chest x-Ottilia Pippenger without obvious focal pneumonia.  ET tube appears to be close to the carina, so this was retracted 1 cm.  PROCEDURES:  Critical Care performed: Yes, see critical care procedure note(s)  CRITICAL CARE Performed by: Trinna Post   Total critical care time: 77 minutes  Critical care time was exclusive of separately billable procedures and treating other patients.  Critical care was necessary to treat or prevent imminent or life-threatening  deterioration.  Critical care was time spent personally by me on the following activities: development of treatment plan with patient and/or surrogate as well as nursing, discussions with consultants, evaluation of patient's response to treatment, examination of patient, obtaining history from patient or surrogate, ordering and performing treatments and interventions, ordering and review of laboratory studies, ordering and review of radiographic studies, pulse oximetry and re-evaluation of patient's condition.   External pacer  Date/Time: 06/03/2022 12:29 AM  Performed by: Trinna Post, MD Authorized by: Trinna Post, MD  Consent: The procedure was performed in an emergent situation. Verbal consent obtained. Consent given by: patient Patient understanding: patient states understanding of the procedure being performed Patient identity confirmed: verbally with patient and arm band Local anesthesia used: no  Anesthesia: Local anesthesia used: no  Sedation: Patient sedated: yes Sedation type: anxiolysis Sedatives: fentanyl and midazolam    Procedure Name: Intubation Date/Time: 06/03/2022 12:32 AM  Performed by: Trinna Post, MDPre-anesthesia Checklist: Patient identified Oxygen Delivery Method: Ambu bag Induction Type: IV induction and Rapid sequence Laryngoscope Size: Glidescope Grade View: Grade II Tube size: 8.0 mm Number of attempts: 1 Airway Equipment and Method: Rigid stylet Placement Confirmation: ETT inserted through vocal cords under direct vision, Positive ETCO2 and Breath sounds checked- equal and bilateral       MEDICATIONS ORDERED IN ED: Medications  sodium chloride flush (NS) 0.9 % injection 3 mL (3 mLs Intravenous Given 06/02/22 2304)  sodium chloride flush (NS) 0.9 % injection 3 mL ( Intravenous MAR Unhold 06/02/22 1742)  0.9 %  sodium chloride infusion ( Intravenous MAR Unhold 06/02/22 1742)  acetaminophen (TYLENOL) tablet 650 mg (650 mg Per Tube Given 06/02/22 2225)   docusate sodium (COLACE) capsule 100 mg ( Oral MAR Unhold 06/02/22 1742)  polyethylene glycol (MIRALAX / GLYCOLAX) packet 17 g ( Oral MAR Unhold 06/02/22 1742)  famotidine (PEPCID) tablet 20 mg (20 mg Per Tube Given 06/02/22 2225)  docusate (COLACE) 50 MG/5ML liquid 100 mg (100 mg Per Tube Given 06/02/22 2226)  polyethylene glycol (MIRALAX / GLYCOLAX) packet 17 g ( Per Tube MAR Unhold 06/02/22 1742)  fentaNYL (SUBLIMAZE) injection 25 mcg ( Intravenous MAR Unhold 06/02/22 1742)  fentaNYL in NS (12mcg/ml) infusion-PREMIX (0 mcg/hr Intravenous Stopped 06/02/22 1800)  fentaNYL (SUBLIMAZE) bolus via infusion 25-100 mcg ( Intravenous MAR Unhold 06/02/22 1742)  norepinephrine (LEVOPHED) 4mg  in (0.016 mg/mL) premix infusion (0 mcg/min Intravenous Stopped 06/02/22 1826)  labetalol (NORMODYNE) injection 10 mg (has no administration in time range)  hydrALAZINE (APRESOLINE) injection 10 mg (10 mg Intravenous Given 06/02/22 2013)  acetaminophen (TYLENOL) tablet 650 mg (has no administration in time range)  ondansetron (ZOFRAN) injection 4 mg (has no administration in time range)  sodium chloride flush (NS) 0.9 % injection 3 mL (3 mLs Intravenous Given 06/02/22 2305)  sodium chloride flush (NS) 0.9 % injection 3 mL (has no administration in time range)  0.9 %  sodium chloride infusion (has no administration in time range)  insulin aspart (novoLOG) injection 0-9 Units (1 Units Subcutaneous Given 06/02/22 2355)  Chlorhexidine Gluconate Cloth 2 % PADS 6 each (has no administration in time range)  Oral care mouth rinse (15 mLs Mouth Rinse Given 06/02/22 2304)  Oral care mouth rinse (has no administration in time range)  atropine 1 MG/10ML injection 0.5 mg (0.5 mg Intravenous Given 06/02/22 1516)  fentaNYL (SUBLIMAZE) injection 25 mcg (25 mcg Intravenous Given 06/02/22 1536)  midazolam (VERSED) injection 2 mg (2 mg Intravenous Given 06/02/22 1538)  naloxone (NARCAN) 2 MG/2ML injection (1 mg  Given 06/02/22  1553)  0.9 %  sodium chloride infusion ( Intravenous New Bag/Given 06/02/22 1603)  rocuronium bromide 100 MG/10ML SOSY (100 mg  Given 06/02/22 1610)  ketamine HCl 50 MG/5ML SOSY (100 mg  Given 06/02/22 1609)     IMPRESSION / MDM / ASSESSMENT AND PLAN / ED COURSE  I reviewed the triage vital signs and the nursing notes.  Differential diagnosis includes, but is not  limited to, symptomatic bradycardia secondary to ischemia, anemia, electrolyte abnormality, thyroid dysfunction  Patient's presentation is most consistent with acute presentation with potential threat to life or bodily function.  89 year old male presenting to the emergency department for evaluation following a syncopal episode, noted to be bradycardic, hypotensive on presentation.  On my initial evaluation the patient his heart rate was in the 30s with persistent blood pressures with systolics in the 60s.  EKG demonstrated sinus rhythm without appreciable heart block.  He was somnolent but arousable.  I did trial atropine with slight improvement in heart rate to the 50s, but persistent hypotension.  With this, decision was made to proceed with transcutaneous pacing.  Patient had significant discomfort with initial attempts to transcutaneously pace and was unable to tolerate prior to obtaining physiologic capture, so he was given a small amount of analgesia with 25 mcg of fentanyl and 2 mg of Versed.  With initial pacing at times, patient had brief improvement in blood pressure, but then began to have downtrending blood pressure again as well as worsening mental status to the point of apnea.  Patient was bagged, given Narcan without any change in his mental status.  Decision was made to proceed with intubation as above.  Case was discussed with Dr. Juliann Pares with cardiology.  He reported that he would prepare the patient for Cath Lab for temporary wire placement and recommended discussion with ICU team for admission.  Case was reviewed with ICU  team.  Patient taken to Cath Lab with plan for ultimate ICU admission.      FINAL CLINICAL IMPRESSION(S) / ED DIAGNOSES   Final diagnoses:  Symptomatic bradycardia  Syncope and collapse  Acute respiratory failure with hypoxia (HCC)     Rx / DC Orders   ED Discharge Orders     None        Note:  This document was prepared using Dragon voice recognition software and may include unintentional dictation errors.   Trinna Post, MD 06/03/22 [phone]

## 2022-06-02 NOTE — ED Triage Notes (Signed)
Pt is brought in by [facility] from [facility] where he lives in independent living but has home health aides. Pt had three syncopal episodes pta.  Pt was with home health aide when he had two syncopal episode and he was lowered to the ground and he denies any injuries.  He was helped up to sit and then he fainted again so he was laid on ground. Initial pressure for ems was 120/90 for ems with HR 58.  Once pt stood up with assistance of ems he had another syncopal episode and ems notes that HR was 38 and BP was 60/40.  No CP or sob.  On arrival to ED while still on stretcher pt advised that he feels like he might pass out. Pt is alert and oriented.  No CP or sob.  HR 37

## 2022-06-02 NOTE — ED Notes (Signed)
Pt was taken to cath lab with RN, RT, NT and cardiologist.

## 2022-06-02 NOTE — Consult Note (Signed)
CARDIOLOGY CONSULT NOTE               Patient ID: Jared Ball MRN: [id number] DOB/AGE: 87-Oct-1934 87 y.o.  Admit date: 06/02/2022 Referring Physician Dr. Trinna Post emergency room Primary Physician Dr. Einar Crow primary Primary Cardiologist [facility] Reason for Consultation symptomatic bradycardia hypotension cardiogenic shock  HPI: Patient 89 year old has history of known multivessel coronary disease diabetes hypertension hyperlipidemia obesity generalized weakness fatigue GERD previous kidney stone atrial fibrillation with bradycardia patient had stable angina has had medications continually reduced and discontinued because of hypotension today the patient had an episode of what appeared to be syncope while on the commode so his caregiver called rescue he was brought to the emergency room found to be severely bradycardic lethargic somnolent hypotensive external pacer was applied patient was placed on pressors eventually with mild sedation to help with external pacing the patient had respiratory compromise and failure requiring intubation.  Patient was then referred for temporary pacemaker placement in anticipation of possible permanent pacemaker.  Patient was intubated and sedated by the time I arrived at bedside to history was from caregiver and emergency room physician  Review of systems complete and found to be negative unless listed above     Past Medical History:  Diagnosis Date   Anxiety    Arthritis    Atrial fibrillation (HCC)    BPH (benign prostatic hyperplasia)    Bronchitis    Chronic kidney disease    Cognitive changes    Diabetes mellitus without complication (HCC)    Type 2   Dysrhythmia    GERD (gastroesophageal reflux disease)    Headache    History of kidney stones    hx of   HOH (hard of hearing)    Hypercholesteremia    Hypertension    IBS (irritable bowel syndrome)    PONV (postoperative nausea and vomiting)    Shingles 15 yrs ago   HX of    Shortness of breath dyspnea    heart related   Sleep apnea    no CPAP   Spinal stenosis    Vertigo    Vitamin D deficiency     Past Surgical History:  Procedure Laterality Date   CAROTID PTA/STENT INTERVENTION Left 11/26/2020   Procedure: CAROTID PTA/STENT INTERVENTION;  Surgeon: Renford Dills, MD;  Location: [facility] INVASIVE CV LAB;  Service: Cardiovascular;  Laterality: Left;   CATARACT EXTRACTION W/PHACO Left 05/17/2019   Procedure: CATARACT EXTRACTION PHACO AND INTRAOCULAR LENS PLACEMENT (IOC) LEFT DIABETIC MALYUGIN;  Surgeon: Lockie Mola, MD;  Location: [facility];  Service: Ophthalmology;  Laterality: Left;  16.54 1:31.1 18.2%   CATARACT EXTRACTION W/PHACO Right 07/12/2019   Procedure: CATARACT EXTRACTION PHACO AND INTRAOCULAR LENS PLACEMENT (IOC) RIGHT MALYUGIN DIABETIC 14.90 01:37.8 15.3%;  Surgeon: Lockie Mola, MD;  Location: [facility];  Service: Ophthalmology;  Laterality: Right;  Diabetic - oral meds Latex   COLONOSCOPY     CYSTOSCOPY W/ RETROGRADES Bilateral 07/02/2014   Procedure: CYSTOSCOPY WITH RETROGRADE PYELOGRAM;  Surgeon: Vanna Scotland, MD;  Location: [facility] ORS;  Service: Urology;  Laterality: Bilateral;   CYSTOSCOPY WITH STENT PLACEMENT Left 07/02/2014   Procedure: CYSTOSCOPY WITH STENT PLACEMENT;  Surgeon: Vanna Scotland, MD;  Location: [facility] ORS;  Service: Urology;  Laterality: Left;   DENTAL SURGERY     in the [employer]   LEFT HEART CATH AND CORONARY ANGIOGRAPHY Left 11/09/2019   Procedure: LEFT HEART CATH AND CORONARY ANGIOGRAPHY with Radial Approach;  Surgeon: Alwyn Pea, MD;  Location: [facility] INVASIVE CV LAB;  Service: Cardiovascular;  Laterality: Left;   TONSILLECTOMY     and adenoids   URETEROSCOPY WITH HOLMIUM LASER LITHOTRIPSY Left 07/02/2014   Procedure: URETEROSCOPY WITH HOLMIUM LASER LITHOTRIPSY;  Surgeon: Vanna Scotland, MD;  Location: [facility] ORS;  Service: Urology;  Laterality: Left;    Medications Prior to  Admission  Medication Sig Dispense Refill Last Dose   acetaminophen (TYLENOL) 500 MG tablet Take 500 mg by mouth every 8 (eight) hours as needed for mild pain or moderate pain.      amiodarone (PACERONE) 100 MG tablet Take 100 mg by mouth daily.      APPLE CIDER VINEGAR PO Take 7 g by mouth once a week.      aspirin EC 81 MG tablet Take 81 mg by mouth daily.       bisacodyl (DULCOLAX) 5 MG EC tablet Take 5 mg by mouth daily as needed for moderate constipation.      BLACK ELDERBERRY PO Take 300 mg by mouth See admin instructions. Couple times a month      calcium elemental as carbonate (TUMS ULTRA 1000) 400 MG chewable tablet Chew 1,000 mg by mouth daily as needed for heartburn.      carboxymethylcellulose (REFRESH PLUS) 0.5 % SOLN Place 1 drop into both eyes daily.      Cholecalciferol 25 MCG (1000 UT) tablet Take 1,000 Units by mouth daily.       clopidogrel (PLAVIX) 75 MG tablet Take 75 mg by mouth daily.      Cyanocobalamin 1000 MCG CAPS Take 1,000 mcg by mouth daily. Gummies      Dulaglutide (TRULICITY) 1.5 MG/0.5ML SOPN Inject 1.5 mg into the skin once a week. 2 mL 1    Dulaglutide (TRULICITY) 1.5 MG/0.5ML SOPN Inject 1.5 mg into the skin once a week. 2 mL 1    finasteride (PROSCAR) 5 MG tablet Take 1 tablet (5 mg total) by mouth daily. 90 tablet 0    fluticasone (FLONASE) 50 MCG/ACT nasal spray Place 1 spray into the nose 2 (two) times daily as needed for allergies.      isosorbide mononitrate (IMDUR) 60 MG 24 hr tablet Take 60 mg by mouth daily.      loperamide (IMODIUM A-D) 2 MG tablet Take 2 mg by mouth 3 (three) times daily as needed for diarrhea or loose stools.       lovastatin (MEVACOR) 40 MG tablet Take 40 mg by mouth at bedtime.       meclizine (ANTIVERT) 12.5 MG tablet Take 12.5 mg by mouth 2 (two) times daily as needed.      metFORMIN (GLUCOPHAGE) 500 MG tablet Take 500 mg by mouth at bedtime.      nystatin-triamcinolone ointment (MYCOLOG) Apply 1 application. topically 2 (two)  times daily. 30 g 3    pantoprazole (PROTONIX) 40 MG tablet Take 40 mg by mouth daily.       Pramox-PE-Glycerin-Petrolatum (PREPARATION H) 1-0.25-14.4-15 % CREA Apply 1 Applicatorful topically daily as needed (Hemorrhoids).      ranolazine (RANEXA) 500 MG 12 hr tablet Take 500 mg by mouth 2 (two) times daily.       tamsulosin (FLOMAX) 0.4 MG CAPS capsule Take 1 capsule (0.4 mg total) by mouth daily. 90 capsule 0    torsemide (DEMADEX) 5 MG tablet Take 5 mg by mouth daily.       triamcinolone ointment (KENALOG) 0.1 % Apply 1 application  topically daily as needed (rash).  TRULICITY 0.75 MG/0.5ML SOPN SMARTSIG:0.5 Milliliter(s) SUB-Q Once a Week      Social History   Socioeconomic History   Marital status: Married    Spouse name: Not on file   Number of children: Not on file   Years of education: Not on file   Highest education level: Not on file  Occupational History   Not on file  Tobacco Use   Smoking status: Former    Types: Cigarettes    Quit date: 1990    Years since quitting: 34.4   Smokeless tobacco: Never  Substance and Sexual Activity   Alcohol use: Yes    Alcohol/week: 0.0 standard drinks of alcohol    Comment: red wine occasionally   Drug use: No   Sexual activity: Not on file  Other Topics Concern   Not on file  Social History Narrative   Not on file   Social Determinants of Health   Financial Resource Strain: Not on file  Food Insecurity: Not on file  Transportation Needs: Not on file  Physical Activity: Not on file  Stress: Not on file  Social Connections: Not on file  Intimate Partner Violence: Not on file    Family History  Problem Relation Age of Onset   Kidney disease Mother    Prostate cancer Neg Hx    Kidney cancer Neg Hx    Bladder Cancer Neg Hx       Review of systems complete and found to be negative unless listed above      PHYSICAL EXAM  General: Well developed, well nourished, in no acute distress HEENT:  Normocephalic and  atramatic Neck:  No JVD.  Lungs: Clear bilaterally to auscultation and percussion. Heart: Bradycardic. Normal S1 and S2 without gallops or murmurs.  Abdomen: Bowel sounds are positive, abdomen soft and non-tender  Msk:  Back normal, normal gait. Normal strength and tone for age. Extremities: No clubbing, cyanosis or 3+edema.   Neuro: Intubated sedated Psych: Intubated sedated  Labs:   Lab Results  Component Value Date   WBC 8.9 06/02/2022   HGB 13.9 06/02/2022   HCT 44.4 06/02/2022   MCV 91.0 06/02/2022   PLT 194 06/02/2022    Recent Labs  Lab 06/02/22 1505  NA 135  K 3.8  CL 106  CO2 17*  BUN 21  CREATININE 1.47*  CALCIUM 8.1*  GLUCOSE 307*   Lab Results  Component Value Date   TROPONINI <0.03 07/19/2016   No results found for: "CHOL" No results found for: "HDL" No results found for: "LDLCALC" No results found for: "TRIG" No results found for: "CHOLHDL" No results found for: "LDLDIRECT"    Radiology: DG Chest Portable 1 View  Result Date: 06/02/2022 CLINICAL DATA:  Intubation. EXAM: PORTABLE CHEST 1 VIEW COMPARISON:  Prior chest x-ray from 2015 FINDINGS: The endotracheal tube is 4.3 cm above the carina. The NG tube is coursing down the esophagus and into the stomach. The heart is mildly enlarged. The mediastinal and hilar contours are prominent but this could be due to the AP projection, portable technique and supine position of the patient. Stable moderate eventration of the left hemidiaphragm. No infiltrates, effusions or pneumothorax. IMPRESSION: 1. The endotracheal tube is 4.3 cm above the carina. 2. NG tube in good position. 3. No acute cardiopulmonary findings. Electronically Signed   By: Rudie Meyer M.D.   On: 06/02/2022 16:50    EKG: Sinus bradycardia rate of 75 nonspecific ST-T wave changes  ASSESSMENT AND PLAN:  Symptomatic  bradycardia Respiratory failure Cardiogenic shock Hypotension Known multivessel coronary disease Altered mental  status Obesity History of stable angina History of diabetes Hypertension Hyperlipidemia Obesity History of atrial fibrillation  Plan Status post temporary pacemaker placement right femoral vein in place turned to the off position as his intrinsic rhythm has improved Make determination about need for permanent pacemaker Continue to avoid any AV nodal blocking drugs Discontinue and avoid amiodarone Continue blood pressure management and control Continue diabetes management and control Do not recommend any invasive evaluation for ischemia as patient has refused coronary bypass surgery after cardiac cath showed multivessel Recommend aggressive medical therapy Wean from vent when able Wean pressors when able Hopefully we can discontinue temporary pacemaker within the next 24 hours Cardiology will continue to follow     Signed: Alwyn Pea MD 06/02/2022, 5:16 PM

## 2022-06-02 NOTE — Plan of Care (Signed)
Discussed earlier with family plan of care for the evening.  They were going home tonight to return tomorrow morning.    Discussed with patient as he is arousable to speech and able to follow simple commands at this time plan of care for the shift, pain management and foley insertion with some teach back displayed at this time.  Patient is able to nods and blink responses at this time.  Problem: Education: Goal: Knowledge of General Education information will improve Description: Including pain rating scale, medication(s)/side effects and non-pharmacologic comfort measures Outcome: Progressing   Problem: Health Behavior/Discharge Planning: Goal: Ability to manage health-related needs will improve Outcome: Progressing   Problem: Elimination: Goal: Will not experience complications related to urinary retention Outcome: Not Progressing   Problem: Pain Managment: Goal: General experience of comfort will improve Outcome: Progressing

## 2022-06-03 ENCOUNTER — Inpatient Hospital Stay (HOSPITAL_COMMUNITY)
Admit: 2022-06-03 | Discharge: 2022-06-03 | Disposition: A | Discharge status: Home / Self Care | Payer: Medicare HMO | Attending: Critical Care Medicine | Admitting: Critical Care Medicine

## 2022-06-03 ENCOUNTER — Inpatient Hospital Stay: Payer: Medicare HMO

## 2022-06-03 DIAGNOSIS — R9431 Abnormal electrocardiogram [ECG] [EKG]: Secondary | ICD-10-CM | POA: Diagnosis not present

## 2022-06-03 DIAGNOSIS — J9601 Acute respiratory failure with hypoxia: Secondary | ICD-10-CM | POA: Diagnosis not present

## 2022-06-03 LAB — GLUCOSE, CAPILLARY
Glucose-Capillary: 110 mg/dL — ABNORMAL HIGH (ref 70–99)
Glucose-Capillary: 121 mg/dL — ABNORMAL HIGH (ref 70–99)
Glucose-Capillary: 122 mg/dL — ABNORMAL HIGH (ref 70–99)
Glucose-Capillary: 165 mg/dL — ABNORMAL HIGH (ref 70–99)
Glucose-Capillary: 185 mg/dL — ABNORMAL HIGH (ref 70–99)
Glucose-Capillary: 193 mg/dL — ABNORMAL HIGH (ref 70–99)

## 2022-06-03 LAB — BASIC METABOLIC PANEL
Anion gap: 8 (ref 5–15)
BUN: 23 mg/dL (ref 8–23)
CO2: 27 mmol/L (ref 22–32)
Calcium: 8.7 mg/dL — ABNORMAL LOW (ref 8.9–10.3)
Chloride: 104 mmol/L (ref 98–111)
Creatinine, Ser: 1.3 mg/dL — ABNORMAL HIGH (ref 0.61–1.24)
GFR, Estimated: 53 mL/min — ABNORMAL LOW (ref >60)
Glucose, Bld: 128 mg/dL — ABNORMAL HIGH (ref 70–99)
Potassium: 3.6 mmol/L (ref 3.5–5.1)
Sodium: 139 mmol/L (ref 135–145)

## 2022-06-03 LAB — ECHOCARDIOGRAM COMPLETE
AR max vel: 3.7 cm2
AV Area VTI: 3.81 cm2
AV Area mean vel: 3.46 cm2
AV Mean grad: 1 mmHg
AV Peak grad: 3 mmHg
Ao pk vel: 0.87 m/s
Area-P 1/2: 2.57 cm2
Height: 70 in
MV VTI: 2.27 cm2
S' Lateral: 3.6 cm
Weight: 3721.36 oz

## 2022-06-03 LAB — MAGNESIUM: Magnesium: 2.1 mg/dL (ref 1.7–2.4)

## 2022-06-03 LAB — LIPID PANEL
Cholesterol: 150 mg/dL (ref 0–200)
HDL: 62 mg/dL (ref >40)
LDL Cholesterol: 66 mg/dL (ref 0–99)
Total CHOL/HDL Ratio: 2.4 RATIO
Triglycerides: 109 mg/dL (ref <150)
VLDL: 22 mg/dL (ref 0–40)

## 2022-06-03 LAB — TROPONIN I (HIGH SENSITIVITY)
Troponin I (High Sensitivity): 3308 ng/L (ref <18)
Troponin I (High Sensitivity): 2586 ng/L (ref <18)
Troponin I (High Sensitivity): 1836 ng/L (ref <18)

## 2022-06-03 MED ORDER — TORSEMIDE 20 MG PO TABS
10.0000 mg | ORAL_TABLET | Freq: Every day | ORAL | Status: DC
  Administered 2022-06-03: 10 mg via ORAL
  Filled 2022-06-03: qty 1

## 2022-06-03 MED ORDER — PERFLUTREN LIPID MICROSPHERE
1.0000 mL | INTRAVENOUS | Status: AC | PRN
  Administered 2022-06-03: 5 mL via INTRAVENOUS

## 2022-06-03 MED ORDER — POTASSIUM CHLORIDE CRYS ER 20 MEQ PO TBCR
20.0000 meq | EXTENDED_RELEASE_TABLET | Freq: Once | ORAL | Status: AC
  Administered 2022-06-03: 20 meq via ORAL
  Filled 2022-06-03: qty 1

## 2022-06-03 MED ORDER — DOCUSATE SODIUM 100 MG PO CAPS
100.0000 mg | ORAL_CAPSULE | Freq: Two times a day (BID) | ORAL | Status: DC
  Administered 2022-06-03: 100 mg via ORAL
  Filled 2022-06-03: qty 1

## 2022-06-03 MED ORDER — PRAVASTATIN SODIUM 20 MG PO TABS
40.0000 mg | ORAL_TABLET | Freq: Every day | ORAL | Status: DC
  Administered 2022-06-03: 40 mg via ORAL
  Filled 2022-06-03: qty 2

## 2022-06-03 MED ORDER — HEPARIN (PORCINE) 25000 UT/250ML-% IV SOLN
1150.0000 [IU]/h | INTRAVENOUS | Status: DC
  Administered 2022-06-03: 1150 [IU]/h via INTRAVENOUS
  Filled 2022-06-03: qty 250

## 2022-06-03 MED ORDER — RANOLAZINE ER 500 MG PO TB12
500.0000 mg | ORAL_TABLET | Freq: Two times a day (BID) | ORAL | Status: DC
  Administered 2022-06-03 (×2): 500 mg via ORAL
  Filled 2022-06-03 (×3): qty 1

## 2022-06-03 MED ORDER — SERTRALINE HCL 50 MG PO TABS: 50.0000 mg | ORAL_TABLET | Freq: Every day | ORAL | Status: DC

## 2022-06-03 MED ORDER — POLYETHYLENE GLYCOL 3350 17 G PO PACK: 17.0000 g | PACK | Freq: Every day | ORAL | Status: DC

## 2022-06-03 MED ORDER — HEPARIN BOLUS VIA INFUSION
4000.0000 [IU] | Freq: Once | INTRAVENOUS | Status: AC
  Administered 2022-06-03: 4000 [IU] via INTRAVENOUS
  Filled 2022-06-03: qty 4000

## 2022-06-03 MED ORDER — FAMOTIDINE 20 MG PO TABS
20.0000 mg | ORAL_TABLET | Freq: Two times a day (BID) | ORAL | Status: DC
  Administered 2022-06-03: 20 mg via ORAL
  Filled 2022-06-03: qty 1

## 2022-06-03 NOTE — Consult Note (Addendum)
PHARMACY CONSULT NOTE  Pharmacy Consult for Electrolyte Monitoring and Replacement   Recent Labs: Potassium (mmol/L)  Date Value  06/03/2022 3.6  01/01/2014 4.3   Magnesium (mg/dL)  Date Value  44/03/4740 2.1   Calcium (mg/dL)  Date Value  59/56/3875 8.7 (L)   Calcium, Total (mg/dL)  Date Value  64/33/2951 9.2   Albumin (g/dL)  Date Value  88/41/6606 3.4 (L)   Phosphorus (mg/dL)  Date Value  30/16/0109 4.4   Sodium (mmol/L)  Date Value  06/03/2022 139  01/01/2014 138   Assessment: 89 y/o M with medical history including Afib, HTN, HLD, DM, CKD BIBEMS from the [facility] due to syncopal episodes. Code STEMI called. Patient became unresponsive in the ED requiring intubation. Pharmacy consulted to assist with electrolyte monitoring and replacement as indicated.  Goal of Therapy:  Potassium 4.0 - 5.1 mmol/L Magnesium 2.0 - 2.4 mg/dL All Other Electrolytes within normal limits  Plan:  --20 mEq po KCl x 1 --Follow-up electrolytes with AM labs tomorrow  Lowella Bandy 06/03/2022 7:27 AM

## 2022-06-03 NOTE — Progress Notes (Signed)
[facility]IN[facility] CARDIOLOGY PROGRESS NOTE   Patient ID: Jared Ball MRN: [id number] DOB/AGE: [id number] 87 y.o.  Admit date: 06/02/2022 Referring Physician Dr. Trinna Post Primary Physician Dr. Einar Crow Primary Cardiologist Dr. Dorothyann Peng Reason for Consultation symptomatic bradycardia  HPI: Jared Ball is a 89 y.o. male who presented to the ED on 06/02/2022 for syncope. They have a past medical history significant for multivessel coronary artery disease, paroxysmal atrial fibrillation, hypertension, hyperlipidemia, OSA on CPAP, type 2 diabetes, chronic kidney disease stage III.  Over the last 1 to 2 months patient has noted an overall decrease in functional status with significant fatigue with minimal exertion.  He reportedly had multiple episodes of syncope yesterday at his assisted living facility and thus was brought to the ED for evaluation.  Heart rate noted to be in the 30s on EKG.  While in the ED he became less responsive and ultimately was intubated.  Cardiology was consulted for symptomatic bradycardia, need for temporary pacemaker.  Interval History:  -Patient now extubated, A&Ox4 this AM. States he feels much better than he did yesterday.  -Temp pacer still in place but has been turned off since placement.  Heart rate maintaining in the 60s at the time of my evaluation. -Denies chest pain, shortness of breath, fatigue, dizziness.  Reports his Imdur was discontinued 2 days ago due to hypotension.  Review of systems complete and found to be negative unless listed above    Vitals:   06/03/22 0726 06/03/22 0800 06/03/22 0900 06/03/22 1000  BP:  132/62 109/65 (!) 112/55  Pulse:  62 63 61  Resp:  20 (!) 26 17  Temp:      TempSrc:      SpO2: 93% 92% 94% 94%  Weight:      Height:         Intake/Output Summary (Last 24 hours) at 06/03/2022 1104 Last data filed at 06/03/2022 0500 Gross per 24 hour  Intake 417.04 ml  Output 850 ml  Net -432.96 ml     PHYSICAL  EXAM General: Well appearing elderly male laying at an incline in hospital bed, well nourished, in no acute distress with wife at bedside. HEENT: Normocephalic and atraumatic. Neck: No JVD.  Lungs: Normal respiratory effort on 3L [place]. Clear bilaterally to auscultation. No wheezes, crackles, rhonchi.  Heart: HRRR. Normal S1 and S2 without gallops or murmurs. Radial & DP pulses 2+ bilaterally. Abdomen: Non-distended appearing.  Msk: Normal strength and tone for age. Extremities: No clubbing, cyanosis. 1+ pitting edema bilaterally.  Neuro: Alert and oriented X 3. Psych: Mood appropriate, affect congruent.    LABS: Basic Metabolic Panel: Recent Labs    06/02/22 1505 06/02/22 1813 06/03/22 0510  NA 135  --  139  K 3.8  --  3.6  CL 106  --  104  CO2 17*  --  27  GLUCOSE 307*  --  128*  BUN 21  --  23  CREATININE 1.47*  --  1.30*  CALCIUM 8.1*  --  8.7*  MG 2.1 2.2 2.1  PHOS 4.4  --   --    Liver Function Tests: Recent Labs    06/02/22 1813  AST 58*  ALT 41  ALKPHOS 73  BILITOT 1.5*  PROT 6.4*  ALBUMIN 3.4*   No results for input(s): "LIPASE", "AMYLASE" in the last 72 hours. CBC: Recent Labs    06/02/22 1505  WBC 8.9  HGB 13.9  HCT 44.4  MCV 91.0  PLT 194  Cardiac Enzymes: Recent Labs    06/02/22 1505 06/02/22 1813 06/03/22 0510  TROPONINIHS 29* 336* 3,308*   BNP: Recent Labs    06/02/22 1505  BNP 687.4*   D-Dimer: No results for input(s): "DDIMER" in the last 72 hours. Hemoglobin A1C: Recent Labs    06/02/22 1805  HGBA1C 7.7*   Fasting Lipid Panel: No results for input(s): "CHOL", "HDL", "LDLCALC", "TRIG", "CHOLHDL", "LDLDIRECT" in the last 72 hours. Thyroid Function Tests: Recent Labs    06/02/22 1505  TSH 9.310*   Anemia Panel: No results for input(s): "VITAMINB12", "FOLATE", "FERRITIN", "TIBC", "IRON", "RETICCTPCT" in the last 72 hours.  DG Chest Port 1 View  Result Date: 06/03/2022 CLINICAL DATA:  Bradycardia. EXAM: PORTABLE CHEST  1 VIEW COMPARISON:  Earlier radiograph dated 06/02/2022. FINDINGS: Similar positioning of the endotracheal and enteric tubes. No interval change in the left lung base opacity, likely given tray shin of the left hemidiaphragm. No new consolidation. There is no pleural effusion or pneumothorax. Stable cardiac silhouette. Atherosclerotic calcification of the aorta. No acute osseous pathology. IMPRESSION: No interval change. Electronically Signed   By: Elgie Collard M.D.   On: 06/03/2022 02:09   DG Chest Portable 1 View  Result Date: 06/02/2022 CLINICAL DATA:  Intubation. EXAM: PORTABLE CHEST 1 VIEW COMPARISON:  Prior chest x-ray from 2015 FINDINGS: The endotracheal tube is 4.3 cm above the carina. The NG tube is coursing down the esophagus and into the stomach. The heart is mildly enlarged. The mediastinal and hilar contours are prominent but this could be due to the AP projection, portable technique and supine position of the patient. Stable moderate eventration of the left hemidiaphragm. No infiltrates, effusions or pneumothorax. IMPRESSION: 1. The endotracheal tube is 4.3 cm above the carina. 2. NG tube in good position. 3. No acute cardiopulmonary findings. Electronically Signed   By: Rudie Meyer M.D.   On: 06/02/2022 16:50     ECHO pending  TELEMETRY reviewed by me 06/03/22: Sinus rhythm rate 50-60s with occasional PVCs  EKG reviewed by me 06/03/22: Marked sinus bradycardia with PVC, heart rate 36  DATA reviewed by me 06/03/22: ED provider note, admission H&P, nursing notes, last 24h vitals tele labs imaging I/O   ASSESSMENT AND PLAN:  Principal Problem:   Acute respiratory failure with hypoxia (HCC) Active Problems:   Other specified hypotension   # Syncope # Symptomatic Bradycardia  # Hx Atrial Fibrillation Patient presented from assisted living facility after multiple witnessed episodes of syncope. HR on presentation to the ED was noted to be in the 30s.   -S/p atropine x 1 in  the ED -S/p temporary pacemaker yesterday evening, was not turned on as his rate improved.  -Heart rate has remained stable in the 50s to 60s.  Okay to remove temporary pacer wire. -Avoid AV nodal blockers, hold home amiodarone -Continue to monitor on telemetry to evaluate need for permanent pacemaker  # Cardiogenic shock # Acute respiratory failure Initially upon presentation to the ED patient was alert and oriented however he ultimately became altered and less responsive and thus requiring intubation and mechanical ventilation. -Patient extubated this morning to 3 L O2 by nasal cannula.  Wean O2 as able. -Pressors discontinued last night.  Blood pressure improved to 109/77 this morning at the time of my evaluation.   # Demand Ischemia vs. ACS # Multivessel Coronary Artery Disease # Carotid artery stenosis s/p left ICA stent 11/2020 Patient with a known history of severe multivessel disease from cardiac cath, he  has declined CABG in the past.  -Troponin in the ED 336 > 3308 this a.m. Heparin infusion ok for 24 to 48 hours after removal of temp wire -Defer invasive cardiac evaluation -Medical management of coronary disease with home statin, DAPT.  Will restart home Ranexa for management of anginal symptoms.  # Lower extremity edema -BNP 687 on admission -Will restart torsemide at 10 mg daily.  Monitor renal function closely  # Chronic kidney disease stage IIIa -Creatinine 1.3 this a.m. trended down from 1.47 yesterday -Continue to monitor closely  This patient's case was discussed and created with Dr. Juliann Pares and he is in agreement.  Athanasia Stanwood [place], PA-C 06/03/2022, 11:04 AM [facility] Cardiology

## 2022-06-03 NOTE — Progress Notes (Signed)
*  PRELIMINARY RESULTS* Echocardiogram 2D Echocardiogram has been performed.  Jared Ball 06/03/2022, 12:57 PM

## 2022-06-03 NOTE — Progress Notes (Signed)
Pt was extubated at this time. Pt tolerated well. Pt is on 3l o2. Pt os alert and oriented and able to make needs known. Will continue to monitor.

## 2022-06-03 NOTE — Progress Notes (Signed)
OT Cancellation Note  Patient Details Name: Amontae Puello MRN: [id number] DOB: [birth year]   Cancelled Treatment:    Reason Eval/Treat Not Completed: Medical issues which prohibited therapy. Order received, chart reviewed. Per RN at bedside, hold therapy until temporary pacemaker wires removed. Will follow up and initiate services as appropriate.   Kathie Dike, M.S. OTR/L  06/03/22, 2:10 PM  ascom [phone]

## 2022-06-03 NOTE — Progress Notes (Signed)
ANTICOAGULATION CONSULT NOTE  Pharmacy Consult for heparin infusion Indication: chest pain/ACS  Allergies  Allergen Reactions   Sulfa Antibiotics Nausea And Vomiting and Other (See Comments)   Tape Rash    adhesive   Metformin Diarrhea    High dose gives diarrhea    Sulfasalazine Nausea Only and Nausea And Vomiting   Zoster Vaccine Live Hives and Rash    Localized; Vaccine for shingles     Patient Measurements: Height: 5\' 10"  (177.8 cm) Weight: 105.5 kg (232 lb 9.4 oz) IBW/kg (Calculated) : 73 Heparin Dosing Weight: 82 kg  Vital Signs: Temp: 98.5 F (36.9 C) (05/22 0701) Temp Source: Oral (05/22 0701) BP: 112/55 (05/22 1000) Pulse Rate: 61 (05/22 1000)  Labs: Recent Labs    06/02/22 1505 06/02/22 1813 06/03/22 0510 06/03/22 1024  HGB 13.9  --   --   --   HCT 44.4  --   --   --   PLT 194  --   --   --   CREATININE 1.47*  --  1.30*  --   TROPONINIHS 29* 336* 3,308* 2,586*    Estimated Creatinine Clearance: 47.8 mL/min (A) (by C-G formula based on SCr of 1.3 mg/dL (H)).   Medical History: Past Medical History:  Diagnosis Date   Anxiety    Arthritis    Atrial fibrillation (HCC)    BPH (benign prostatic hyperplasia)    Bronchitis    Chronic kidney disease    Cognitive changes    Diabetes mellitus without complication (HCC)    Type 2   Dysrhythmia    GERD (gastroesophageal reflux disease)    Headache    History of kidney stones    hx of   HOH (hard of hearing)    Hypercholesteremia    Hypertension    IBS (irritable bowel syndrome)    PONV (postoperative nausea and vomiting)    Shingles 15 yrs ago   HX of   Shortness of breath dyspnea    heart related   Sleep apnea    no CPAP   Spinal stenosis    Vertigo    Vitamin D deficiency     Medications:  Scheduled:   Chlorhexidine Gluconate Cloth  6 each Topical Daily   docusate  100 mg Per Tube BID   famotidine  20 mg Per Tube BID   insulin aspart  0-9 Units Subcutaneous Q4H   polyethylene  glycol  17 g Per Tube Daily   potassium chloride  20 mEq Oral Once   pravastatin  40 mg Oral q1800   ranolazine  500 mg Oral BID   sodium chloride flush  3 mL Intravenous Q12H   sodium chloride flush  3 mL Intravenous Q12H   torsemide  10 mg Oral Daily    Assessment: 89 y/o M with medical history including Afib, HTN, HLD, DM, CKD BIBEMS from the [facility] due to syncopal episodes. Code STEMI called. Patient became unresponsive in the ED requiring intubation. A review of medical records reveals no chronic anticoagulation prior to arrival  Goal of Therapy:  Heparin level 0.3-0.7 units/ml Monitor platelets by anticoagulation protocol: Yes   Plan:  Give 4000 units bolus x 1 Start heparin infusion at 1150 units/hr Check anti-Xa level in 8 hours and daily while on heparin Continue to monitor H&H and platelets  Lowella Bandy 06/03/2022,1:20 PM

## 2022-06-03 NOTE — Progress Notes (Signed)
NAME:  Jared Ball, MRN:  [id number], DOB:  06/29/1932, LOS: 1 ADMISSION DATE:  06/02/2022, CONSULTATION DATE: 06/02/2022 REFERRING MD: Dr. Rosalia Hammers, CHIEF COMPLAINT: Loss of Consciousness and Bradycardia    Brief Pt Description / Synopsis:  89 y.o. male admitted with Symptomatic bradycardia with cardiogenic shock, s/p temporary transvenous pacemaker placement, required intubation due to hemodynamic instability.  History of Present Illness:  This is an 89 yo male who presented to [facility] ER on 05/21 via EMS from [facility] [place] for evaluation following multiple syncopal episodes.  He had 2 syncopal episodes that was witnessed by his home health aide who lowered him to the ground.  He was later helped into a sitting position, but had a 3rd syncopal episode and had to be laid back on the ground prompting EMS notification.  Upon EMS arrival pts initial bp was 120/90 with a hr of 58.  However, upon standing pt had another syncopal episode witnessed by EMS his hr dropped to 38 and he became hypotensive bp 60/40.    ED Course  Upon arrival to the ER pt alert/oriented but reported he felt like he was going to pass out.  Initial vital signs: hr 30's/sbp 62-90/respiratory rate 17.  Pt subsequently became unresponsive and hypoxic requiring mechanical intubation.  Pt requiring levophed gtt to maintain map >65.  EKG revealed sinus bradycardia with a prolonged PR interval/heart rate 38.  Cardiologist Dr. Juliann Pares consulted and pt emergently transported to the cardiac cath lab.  PCCM contacted for ICU admission.  ER lab results: CO2 17/glucose 307/creatinine 1.47/calcium 8.1/BNP 687.4/TSH 9.310  CXR: The endotracheal tube is 4.3 cm above the carina. NG tube in good position. No acute cardiopulmonary findings.  Pertinent  Medical History  Anxiety  Arthritis  Atrial Fibrillation  BPH Bronchitis  Chronic Kidney Disease  Cognitive Changes  Type II Diabetes Mellitus  Dysrhythmia  GERD   Headache HOH Hypercholesteremia HTN IBS OSA (does not wear CPAP) Spinal Stenosis  Vertigo  Vitamin D Deficiency    Significant Hospital Events: Including procedures, antibiotic start and stop dates in addition to other pertinent events   05/21: Pt admitted with syncope secondary to cardiogenic shock requiring mechanical intubation and temporary transvenous pacemaker placement  05/22: On minimal vent support, tolerating SBT ~ EXTUBATED. Cardiology is ok with discontinuation of temporary TV pacer, however pt requesting to leave it in for today.  Troponin increased to 3308, plan to start Heparin gtt for 24-48 hrs.  Interim History / Subjective:  -No significant events noted overnight -Afebrile, hemodynamically stable, off vasopressors -Awake and alert following commands, tolerating SBT ~ EXTUBATED -Cardiology is ok with d/c temporary TV pacer as it has been off since pacer was placed ~ pt requests that pacer remain in place for today -Troponin peaked at 3308 ~ Cardiology recommends Heparin gtt for 24-48 hrs -Creatinine improved to 1.3 from 1.47, UOP 775 cc (net negative 400 cc)  Objective   Blood pressure (!) 147/63, pulse 63, temperature 98.8 F (37.1 C), temperature source Oral, resp. rate 19, height 5\' 10"  (1.778 m), weight 105.5 kg, SpO2 93 %.    Vent Mode: PSV;CPAP FiO2 (%):  [35 %-100 %] 35 % Set Rate:  [16 bmp] 16 bmp Vt Set:  [500 mL] 500 mL PEEP:  [5 cmH20] 5 cmH20 Pressure Support:  [5 cmH20-8 cmH20] 5 cmH20   Intake/Output Summary (Last 24 hours) at 06/03/2022 0750 Last data filed at 06/03/2022 0500 Gross per 24 hour  Intake 417.04 ml  Output 850 ml  Net -432.96 ml   Filed Weights   06/02/22 1500 06/03/22 0500  Weight: 105.2 kg 105.5 kg    Examination: General: Acutely-ill appearing male, NAD mechanically intubated  HENT: Supple, no JVD  Lungs: Faint rhonchi throughout, even, non labored  Cardiovascular: NSR, s1s2, no m/r/g, 2+ radial/1+ distal pulses, no  edema  Abdomen: +BS x4, non tender, non distended  Extremities: Normal bulk and tone, no deformities, no edema Neuro: Sedation is off, following commands, no focal deficits, pupils PERRL GU: Foley catheter in place draining yellow urine  Resolved Hospital Problem list     Assessment & Plan:   #Cardiogenic shock due to symptomatic bradycardia s/p temporary transvenous pacemaker placement  #Syncopal episodes due to above #Mildly elevated troponin's suspect secondary to demand ischemia vs NSTEMI Hx: Atrial fibrillation, HTN, multivessel coronary disease, and hypercholesteremia  -Continuous cardiac monitoring -Maintain MAP >65 -Vasopressors as needed to maintain MAP goal ~ weaned off -Trend lactic acid until normalized -HS Troponin peaked at 3308 -Echocardiogram pending -TSH elevated at 9.3, free T4 slightly elevated at 1.43 -Avoid AV nodal blocking medications and hold outpatient amiodarone  -Temporary transvenous pacemaker settings: pacer rate 60/sensitivity to output of 2 but pacer turned off post insertion as his intrinsic rhythm improved to sinus rhythm  -Cardiology following, appreciate input: no plans for invasive evaluation for ischemia as pt has refused coronary bypass surgery following previous cardiac cath results revealed multivessel disease ~ they are ok with Heparin gtt for 24 - 48 hrs  #Acute hypoxic respiratory failure due to hemodynamic instability #Mechanical intubation  Hx: OSA  EXTUBATED 5/22 -Supplemental O2 as needed to maintain O2 sats >92% -Follow intermittent Chest X-ray & ABG as needed -Bronchodilators PRN -Pulmonary toilet as able  #Stage IIIa CKD ~ IMPROVED #Non anion gap metabolic acidosis ~ RESOLVED -Monitor I&O's / urinary output -Follow BMP -Ensure adequate renal perfusion -Avoid nephrotoxic agents as able -Replace electrolytes as indicated  #Type II diabetes mellitus  Hemoglobin A1c 7.7 on 06/02/22 -CBG's q4h; Target range of 140 to  180 -SSI -Follow ICU Hypo/Hyperglycemia protocol  #Sedation needs in setting of mechanical ventilation -Maintain a RASS goal of 0 to -1 -Fentanyl as needed to maintain RASS goal -Avoid sedating medications as able -Daily wake up assessment   Best Practice (right click and "Reselect all SmartList Selections" daily)  Diet/type: NPO, perform bedside swallow post extubation DVT prophylaxis: SCD ~ Start Heparin gtt GI prophylaxis: H2B Lines: Right femoral temporary transvenous pacemaker  Foley:  yes, and is still needed Code Status:  DNR Last date of multidisciplinary goals of care discussion [06/03/22]  5/22: Pt and family updated at bedside.  All questions answered.  Labs   CBC: Recent Labs  Lab 06/02/22 1505  WBC 8.9  HGB 13.9  HCT 44.4  MCV 91.0  PLT 194     Basic Metabolic Panel: Recent Labs  Lab 06/02/22 1505 06/02/22 1813 06/03/22 0510  NA 135  --  139  K 3.8  --  3.6  CL 106  --  104  CO2 17*  --  27  GLUCOSE 307*  --  128*  BUN 21  --  23  CREATININE 1.47*  --  1.30*  CALCIUM 8.1*  --  8.7*  MG 2.1 2.2 2.1  PHOS 4.4  --   --     GFR: Estimated Creatinine Clearance: 47.8 mL/min (A) (by C-G formula based on SCr of 1.3 mg/dL (H)). Recent Labs  Lab 06/02/22 1505 06/02/22 1805 06/02/22 1921  WBC  8.9  --   --   LATICACIDVEN  --  1.6 2.2*     Liver Function Tests: Recent Labs  Lab 06/02/22 1813  AST 58*  ALT 41  ALKPHOS 73  BILITOT 1.5*  PROT 6.4*  ALBUMIN 3.4*   No results for input(s): "LIPASE", "AMYLASE" in the last 168 hours. No results for input(s): "AMMONIA" in the last 168 hours.  ABG    Component Value Date/Time   PHART 7.42 06/02/2022 1802   PCO2ART 39 06/02/2022 1802   PO2ART 67 (L) 06/02/2022 1802   HCO3 25.3 06/02/2022 1802   O2SAT 92.5 06/02/2022 1802     Coagulation Profile: No results for input(s): "INR", "PROTIME" in the last 168 hours.  Cardiac Enzymes: No results for input(s): "CKTOTAL", "CKMB", "CKMBINDEX",  "TROPONINI" in the last 168 hours.  HbA1C: Hgb A1c MFr Bld  Date/Time Value Ref Range Status  06/02/2022 06:05 PM 7.7 (H) 4.8 - 5.6 % Final    Comment:    (NOTE) Pre diabetes:          5.7%-6.4%  Diabetes:              >6.4%  Glycemic control for   <7.0% adults with diabetes   11/26/2020 11:14 AM 7.2 (H) 4.8 - 5.6 % Final    Comment:    (NOTE) Pre diabetes:          5.7%-6.4%  Diabetes:              >6.4%  Glycemic control for   <7.0% adults with diabetes     CBG: Recent Labs  Lab 06/02/22 1750 06/02/22 1926 06/02/22 2332 06/03/22 0348  GLUCAP 288* 212* 129* 110*    Review of Systems:   Unable to assess pt mechanically intubated  Past Medical History:  He,  has a past medical history of Anxiety, Arthritis, Atrial fibrillation (HCC), BPH (benign prostatic hyperplasia), Bronchitis, Chronic kidney disease, Cognitive changes, Diabetes mellitus without complication (HCC), Dysrhythmia, GERD (gastroesophageal reflux disease), Headache, History of kidney stones, HOH (hard of hearing), Hypercholesteremia, Hypertension, IBS (irritable bowel syndrome), PONV (postoperative nausea and vomiting), Shingles (15 yrs ago), Shortness of breath dyspnea, Sleep apnea, Spinal stenosis, Vertigo, and Vitamin D deficiency.   Surgical History:   Past Surgical History:  Procedure Laterality Date   CAROTID PTA/STENT INTERVENTION Left 11/26/2020   Procedure: CAROTID PTA/STENT INTERVENTION;  Surgeon: Renford Dills, MD;  Location: [facility]VASIVE [facility];  Service: Cardiovascular;  Laterality: Left;   CATARACT EXTRACTION W/PHACO Left 05/17/2019   Procedure: CATARACT EXTRACTION PHACO AND INTRAOCULAR LENS PLACEMENT (IOC) LEFT DIABETIC MALYUGIN;  Surgeon: Lockie Mola, MD;  Location: [facility];  Service: Ophthalmology;  Laterality: Left;  16.54 1:31.1 18.2%   CATARACT EXTRACTION W/PHACO Right 07/12/2019   Procedure: CATARACT EXTRACTION PHACO AND INTRAOCULAR LENS PLACEMENT (IOC)  RIGHT MALYUGIN DIABETIC 14.90 01:37.8 15.3%;  Surgeon: Lockie Mola, MD;  Location: [facility];  Service: Ophthalmology;  Laterality: Right;  Diabetic - oral meds Latex   COLONOSCOPY     CYSTOSCOPY W/ RETROGRADES Bilateral 07/02/2014   Procedure: CYSTOSCOPY WITH RETROGRADE PYELOGRAM;  Surgeon: Vanna Scotland, MD;  Location: [facility] ORS;  Service: Urology;  Laterality: Bilateral;   CYSTOSCOPY WITH STENT PLACEMENT Left 07/02/2014   Procedure: CYSTOSCOPY WITH STENT PLACEMENT;  Surgeon: Vanna Scotland, MD;  Location: [facility] OR[facility];  Service: Urology;  Laterality: Left;   DENTAL SURGERY     in the [employer]   LEFT HEART CATH AND CORONARY ANGIOGRAPHY Left 11/09/2019   Procedure:  LEFT HEART CATH AND CORONARY ANGIOGRAPHY with Radial Approach;  Surgeon: Alwyn Pea, MD;  Location: [facility];  Service: Cardiovascular;  Laterality: Left;   TONSILLECTOMY     and adenoids   URETEROSCOPY WITH HOLMIUM LASER LITHOTRIPSY Left 07/02/2014   Procedure: URETEROSCOPY WITH HOLMIUM LASER LITHOTRIPSY;  Surgeon: Vanna Scotland, MD;  Location: [facility] OR[facility];  Service: Urology;  Laterality: Left;     Social History:   reports that he quit smoking about 34 years ago. His smoking use included cigarettes. He has never used smokeless tobacco. He reports current alcohol use. He reports that he does not use drugs.   Family History:  His family history includes Kidney disease in his mother. There is no history of Prostate cancer, Kidney cancer, or Bladder Cancer.   Allergies Allergies  Allergen Reactions   Sulfa Antibiotics Nausea And Vomiting and Other (See Comments)   Tape Rash    adhesive   Metformin Diarrhea    High dose gives diarrhea    Sulfasalazine Nausea Only and Nausea And Vomiting   Zoster Vaccine Live Hives and Rash    Localized; Vaccine for shingles      Home Medications  Prior to Admission medications   Medication Sig Start Date End Date Taking? Authorizing Provider   acetaminophen (TYLENOL) 500 MG tablet Take 500 mg by mouth every 8 (eight) hours as needed for mild pain or moderate pain.    [provider]  amiodarone (PACERONE) 100 MG tablet Take 100 mg by mouth daily. 12/14/19   [provider]  APPLE CIDER VINEGAR PO Take 7 g by mouth once a week.    [provider]  aspirin EC 81 MG tablet Take 81 mg by mouth daily.     [provider]  bisacodyl (DULCOLAX) 5 MG EC tablet Take 5 mg by mouth daily as needed for moderate constipation.    [provider]  BLACK ELDERBERRY PO Take 300 mg by mouth See admin instructions. Couple times a month    [provider]  calcium elemental as carbonate (TUMS ULTRA 1000) 400 MG chewable tablet Chew 1,000 mg by mouth daily as needed for heartburn.    [provider]  carboxymethylcellulose (REFRESH PLUS) 0.5 % SOLN Place 1 drop into both eyes daily.    [provider]  Cholecalciferol 25 MCG (1000 UT) tablet Take 1,000 Units by mouth daily.  08/28/16   [provider]  clopidogrel (PLAVIX) 75 MG tablet Take 75 mg by mouth daily. 01/18/20   [provider]  Cyanocobalamin 1000 MCG CAPS Take 1,000 mcg by mouth daily. Gummies    [provider]  Dulaglutide (TRULICITY) 1.5 MG/0.5ML SOPN Inject 1.5 mg into the skin once a week. 12/02/21     Dulaglutide (TRULICITY) 1.5 MG/0.5ML SOPN Inject 1.5 mg into the skin once a week. 04/27/22     finasteride (PROSCAR) 5 MG tablet Take 1 tablet (5 mg total) by mouth daily. 06/01/22   Michiel Cowboy A, PA-C  fluticasone (FLONASE) 50 MCG/ACT nasal spray Place 1 spray into the nose 2 (two) times daily as needed for allergies. 04/28/12   [provider]  isosorbide mononitrate (IMDUR) 60 MG 24 hr tablet Take 60 mg by mouth daily.    [provider]  loperamide (IMODIUM A-D) 2 MG tablet Take 2 mg by mouth 3 (three) times daily as needed for diarrhea or loose stools.     [provider]  lovastatin (MEVACOR) 40 MG  tablet Take 40 mg by mouth at bedtime.  03/12/16   [provider]  meclizine (ANTIVERT) 12.5 MG tablet Take 12.5 mg by mouth 2 (two) times daily as needed. 04/29/21   [provider]  metFORMIN (GLUCOPHAGE) 500 MG tablet Take 500 mg by mouth at bedtime. 06/09/16   [provider]  nystatin-triamcinolone ointment (MYCOLOG) Apply 1 application. topically 2 (two) times daily. 05/06/21   Michiel Cowboy A, PA-C  pantoprazole (PROTONIX) 40 MG tablet Take 40 mg by mouth daily.  01/24/18   [provider]  Pramox-PE-Glycerin-Petrolatum (PREPARATION H) 1-0.25-14.4-15 % CREA Apply 1 Applicatorful topically daily as needed (Hemorrhoids).    [provider]  ranolazine (RANEXA) 500 MG 12 hr tablet Take 500 mg by mouth 2 (two) times daily.  03/22/18   [provider]  tamsulosin (FLOMAX) 0.4 MG CAPS capsule Take 1 capsule (0.4 mg total) by mouth daily. 06/01/22   Michiel Cowboy A, PA-C  torsemide (DEMADEX) 5 MG tablet Take 5 mg by mouth daily.  05/29/15   [provider]  triamcinolone ointment (KENALOG) 0.1 % Apply 1 application  topically daily as needed (rash). 09/20/20   [provider]  TRULICITY 0.75 MG/0.5ML SOPN SMARTSIG:0.5 Milliliter(s) SUB-Q Once a Week 08/07/21   [provider]     Critical care time: 40 minutes     Harlon Ditty, AGACNP-BC [facility] Prefer epic messenger for cross cover needs If after hours, please call E-link

## 2022-06-03 NOTE — Progress Notes (Signed)
Notified by nursing earlier today that following initiation of Heparin gtt, pt had episode of small amount of bleeding from temporary TV pacemaker site which resolved.  Plan was to continue with Heparin and monitor for further bleeding  Later this evening pt has some hematuria via foley.  Given that pt has no ACS symptoms and troponin peaked at 3308 (Cardiology had planned for only 24-48 hrs of Heparin), Heparin discontinued.  Continue to monitor for s/sx of bleeding and follow CBC.     Harlon Ditty, AGACNP-BC [facility] Prefer epic messenger for cross cover needs If after hours, please call E-link'

## 2022-06-03 NOTE — Evaluation (Signed)
Physical Therapy Evaluation Patient Details Name: Jared Ball MRN: [id number] DOB: 11-04-1932 Today's Date: 06/03/2022  History of Present Illness  89 yo male who presented to [facility] ER on 05/21 via EMS from [place] [facility] [place] for evaluation following multiple syncopal episodes.  He had 2 syncopal episodes that was witnessed by his home health aide who lowered him to the ground.  He was later helped into a sitting position, but had a 3rd syncopal episode and had to be laid back on the ground prompting EMS notification.  Upon EMS arrival pts initial bp was 120/90 with a hr of 58.  However, upon standing pt had another syncopal episode witnessed by EMS his hr dropped to 38 and he became hypotensive bp 60/40.  Pt need emergent intubation; at time of PT eval still with temporary cardiac pacer in place.  Clinical Impression  Cleared for activity per medical/cardio standpoint, was supposed to have had temp pacer removed, but still in place at time of eval so limited mobility assessment this date.  Pt displayed good UE strength b/l and despite chronic L LE nerve impingement weakness did display functional strength, deferred R LE movement.  Pt has had some BP and HR issues, had been intubated and essentially bed bound for the last day.  Suspect he will have some functional mobility limitations and not be as mobile as his baseline.  Pt will require further PT here and at discharge, further input per OOB mobility assessment when appropriate.     Recommendations for follow up therapy are one component of a multi-disciplinary discharge planning process, led by the attending physician.  Recommendations may be updated based on patient status, additional functional criteria and insurance authorization.  Follow Up Recommendations Can patient physically be transported by private vehicle: No     Assistance Recommended at Discharge Intermittent Supervision/Assistance  Patient can return home with the following  A  little help with walking and/or transfers;A little help with bathing/dressing/bathroom;Assistance with cooking/housework;Assist for transportation    Equipment Recommendations  (TBD per mobility assessment)  Recommendations for Other Services       Functional Status Assessment Patient has had a recent decline in their functional status and demonstrates the ability to make significant improvements in function in a reasonable and predictable amount of time.     Precautions / Restrictions Precautions Precautions: Fall (temp pacer R groin) Restrictions Weight Bearing Restrictions: No      Mobility  Bed Mobility               General bed mobility comments: deferred due to temp pacer    Transfers                        Ambulation/Gait                  Stairs            Wheelchair Mobility    Modified Rankin (Stroke Patients Only)       Balance                                             Pertinent Vitals/Pain Pain Assessment Pain Assessment: No/denies pain ("just sore from being in bed")    Home Living Family/patient expects to be discharged to:: Unsure Living Arrangements: Alone Available Help at Discharge: Available PRN/intermittently;Personal care attendant;Friend(s) Type of  Home: Independent living facility (apartment) Home Access: Level entry       Home Layout: One level Home Equipment: Rollator (4 wheels);Electric scooter      Prior Function Prior Level of Function : Needs assist             Mobility Comments: Pt has had multiple falls in recent months, all seemingly BP/OSHT related.  Prior to ~2 months ago getting around in the home without AD, does not venture out in community often ADLs Comments: able to dress, perform self care, etc but has aides QD     Hand Dominance        Extremity/Trunk Assessment   Upper Extremity Assessment Upper Extremity Assessment: Overall WFL for tasks assessed (pt  with age appropriate limitations with functional strength t/o)    Lower Extremity Assessment Lower Extremity Assessment:  (deferred R LE 2/2 temp pacer, L LE grossly 4-/5 with chronic neuropathy/impingement weakness)       Communication   Communication: No difficulties  Cognition Arousal/Alertness: Awake/alert Behavior During Therapy: WFL for tasks assessed/performed Overall Cognitive Status: Within Functional Limits for tasks assessed                                          General Comments General comments (skin integrity, edema, etc.): Pt pleasant and eager to speak with and work gentle b/l UE and L LE exercises.  Cleared with medical team to work with PT, however temp pacer femoral limitations did not allow for mobility assessment.    Exercises Other Exercises Other Exercises: performed/introduced basic UE and L LE exercises to maintain activity in CCU, educated on course of recovery and expectations, discussed PLOF and goals of care   Assessment/Plan    PT Assessment Patient needs continued PT services  PT Problem List Decreased activity tolerance;Decreased balance;Decreased mobility;Decreased knowledge of use of DME;Decreased safety awareness;Decreased strength;Cardiopulmonary status limiting activity       PT Treatment Interventions DME instruction;Gait training;Functional mobility training;Therapeutic activities;Therapeutic exercise;Balance training;Patient/family education    PT Goals (Current goals can be found in the Care Plan section)  Acute Rehab PT Goals Patient Stated Goal: get back to walking w/o passing out PT Goal Formulation: With patient Time For Goal Achievement: 06/16/22 Potential to Achieve Goals: Good    Frequency Min 3X/week     Co-evaluation               AM-PAC PT "6 Clicks" Mobility  Outcome Measure Help needed turning from your back to your side while in a flat bed without using bedrails?: Total Help needed moving  from lying on your back to sitting on the side of a flat bed without using bedrails?: Total Help needed moving to and from a bed to a chair (including a wheelchair)?: Total Help needed standing up from a chair using your arms (e.g., wheelchair or bedside chair)?: Total Help needed to walk in hospital room?: Total Help needed climbing 3-5 steps with a railing? : Total 6 Click Score: 6    End of Session   Activity Tolerance: Patient tolerated treatment well;Treatment limited secondary to medical complications (Comment) Patient left: in bed;with call bell/phone within reach;with family/visitor present Nurse Communication: Mobility status PT Visit Diagnosis: Muscle weakness (generalized) (M62.81);Difficulty in walking, not elsewhere classified (R26.2);Dizziness and giddiness (R42)    Time: 9147-8295 PT Time Calculation (min) (ACUTE ONLY): 19 min   Charges:  PT Evaluation $PT Eval Low Complexity: 1 Low          Malachi Pro, DPT 06/03/2022, 5:17 PM

## 2022-06-04 ENCOUNTER — Inpatient Hospital Stay: Payer: Medicare HMO

## 2022-06-04 DIAGNOSIS — J9601 Acute respiratory failure with hypoxia: Secondary | ICD-10-CM | POA: Diagnosis not present

## 2022-06-04 LAB — MAGNESIUM: Magnesium: 2.3 mg/dL (ref 1.7–2.4)

## 2022-06-04 LAB — COMPREHENSIVE METABOLIC PANEL
ALT: 67 U/L — ABNORMAL HIGH (ref 0–44)
AST: 95 U/L — ABNORMAL HIGH (ref 15–41)
Albumin: 2.8 g/dL — ABNORMAL LOW (ref 3.5–5.0)
Alkaline Phosphatase: 82 U/L (ref 38–126)
Anion gap: 20 — ABNORMAL HIGH (ref 5–15)
BUN: 30 mg/dL — ABNORMAL HIGH (ref 8–23)
CO2: 21 mmol/L — ABNORMAL LOW (ref 22–32)
Calcium: 7.6 mg/dL — ABNORMAL LOW (ref 8.9–10.3)
Chloride: 99 mmol/L (ref 98–111)
Creatinine, Ser: 1.79 mg/dL — ABNORMAL HIGH (ref 0.61–1.24)
GFR, Estimated: 36 mL/min — ABNORMAL LOW (ref >60)
Glucose, Bld: 377 mg/dL — ABNORMAL HIGH (ref 70–99)
Potassium: 5.2 mmol/L — ABNORMAL HIGH (ref 3.5–5.1)
Sodium: 140 mmol/L (ref 135–145)
Total Bilirubin: 2 mg/dL — ABNORMAL HIGH (ref 0.3–1.2)
Total Protein: 5.4 g/dL — ABNORMAL LOW (ref 6.5–8.1)

## 2022-06-04 LAB — BLOOD GAS, ARTERIAL
Acid-base deficit: 1.4 mmol/L (ref 0.0–2.0)
Bicarbonate: 22.8 mmol/L (ref 20.0–28.0)
Delivery systems: POSITIVE
Expiratory PAP: 6 cmH2O
FIO2: 40 %
Inspiratory PAP: 12 cmH2O
O2 Saturation: 92.4 %
Patient temperature: 37
pCO2 arterial: 36 mmHg (ref 32–48)
pH, Arterial: 7.41 (ref 7.35–7.45)
pO2, Arterial: 62 mmHg — ABNORMAL LOW (ref 83–108)

## 2022-06-04 LAB — CBC
HCT: 41.3 % (ref 39.0–52.0)
HCT: 44.4 % (ref 39.0–52.0)
Hemoglobin: 13.2 g/dL (ref 13.0–17.0)
Hemoglobin: 14.3 g/dL (ref 13.0–17.0)
MCH: 28.4 pg (ref 26.0–34.0)
MCH: 28.5 pg (ref 26.0–34.0)
MCHC: 32 g/dL (ref 30.0–36.0)
MCHC: 32.2 g/dL (ref 30.0–36.0)
MCV: 89 fL (ref 80.0–100.0)
MCV: 88.4 fL (ref 80.0–100.0)
Platelets: 176 10*3/uL (ref 150–400)
Platelets: 141 10*3/uL — ABNORMAL LOW (ref 150–400)
RBC: 4.64 MIL/uL (ref 4.22–5.81)
RBC: 5.02 MIL/uL (ref 4.22–5.81)
RDW: 14.8 % (ref 11.5–15.5)
RDW: 15.2 % (ref 11.5–15.5)
WBC: 11 10*3/uL — ABNORMAL HIGH (ref 4.0–10.5)
WBC: 12.5 10*3/uL — ABNORMAL HIGH (ref 4.0–10.5)
nRBC: 0 % (ref 0.0–0.2)
nRBC: 0 % (ref 0.0–0.2)

## 2022-06-04 LAB — BASIC METABOLIC PANEL
Anion gap: 8 (ref 5–15)
BUN: 28 mg/dL — ABNORMAL HIGH (ref 8–23)
CO2: 24 mmol/L (ref 22–32)
Calcium: 8.4 mg/dL — ABNORMAL LOW (ref 8.9–10.3)
Chloride: 103 mmol/L (ref 98–111)
Creatinine, Ser: 1.44 mg/dL — ABNORMAL HIGH (ref 0.61–1.24)
GFR, Estimated: 47 mL/min — ABNORMAL LOW (ref >60)
Glucose, Bld: 127 mg/dL — ABNORMAL HIGH (ref 70–99)
Potassium: 3.9 mmol/L (ref 3.5–5.1)
Sodium: 135 mmol/L (ref 135–145)

## 2022-06-04 LAB — LACTIC ACID, PLASMA: Lactic Acid, Venous: >9 mmol/L (ref 0.5–1.9)

## 2022-06-04 LAB — GLUCOSE, CAPILLARY
Glucose-Capillary: 125 mg/dL — ABNORMAL HIGH (ref 70–99)
Glucose-Capillary: 174 mg/dL — ABNORMAL HIGH (ref 70–99)

## 2022-06-04 LAB — PROCALCITONIN: Procalcitonin: <0.1 ng/mL

## 2022-06-04 LAB — LIPOPROTEIN A (LPA): Lipoprotein (a): <8.4 nmol/L (ref <75.0)

## 2022-06-04 LAB — TROPONIN I (HIGH SENSITIVITY): Troponin I (High Sensitivity): 935 ng/L (ref <18)

## 2022-06-04 MED ORDER — HALOPERIDOL LACTATE 5 MG/ML IJ SOLN: 2.5000 mg | INTRAMUSCULAR | Status: DC | PRN

## 2022-06-04 MED ORDER — HYDROMORPHONE BOLUS VIA INFUSION: 1.0000 mg | INTRAVENOUS | Status: DC | PRN

## 2022-06-04 MED ORDER — VASOPRESSIN 20 UNITS/100 ML INFUSION FOR SHOCK
0.0000 [IU]/min | INTRAVENOUS | Status: DC
  Administered 2022-06-04: 0.03 [IU]/min via INTRAVENOUS
  Filled 2022-06-04: qty 100

## 2022-06-04 MED ORDER — EPINEPHRINE 1 MG/10ML IJ SOSY
PREFILLED_SYRINGE | INTRAMUSCULAR | Status: AC
  Administered 2022-06-04: 1 mg via INTRAVENOUS
  Filled 2022-06-04: qty 10

## 2022-06-04 MED ORDER — ACETAMINOPHEN 650 MG RE SUPP: 650.0000 mg | Freq: Four times a day (QID) | RECTAL | Status: DC | PRN

## 2022-06-04 MED ORDER — NOREPINEPHRINE 4 MG/250ML-% IV SOLN
2.0000 ug/min | INTRAVENOUS | Status: DC
  Administered 2022-06-04: 10 ug/min via INTRAVENOUS
  Administered 2022-06-04: 40 ug/min via INTRAVENOUS
  Filled 2022-06-04: qty 250

## 2022-06-04 MED ORDER — HYDROCORTISONE SOD SUC (PF) 100 MG IJ SOLR: 100.0000 mg | Freq: Three times a day (TID) | INTRAMUSCULAR | Status: DC

## 2022-06-04 MED ORDER — SODIUM BICARBONATE 8.4 % IV SOLN
INTRAVENOUS | Status: AC
  Administered 2022-06-04: 50 meq
  Filled 2022-06-04: qty 50

## 2022-06-04 MED ORDER — SODIUM BICARBONATE 8.4 % IV SOLN
50.0000 meq | Freq: Once | INTRAVENOUS | Status: AC
  Administered 2022-06-04: 50 meq via INTRAVENOUS

## 2022-06-04 MED ORDER — LORAZEPAM 2 MG/ML IJ SOLN: 2.0000 mg | INTRAMUSCULAR | Status: DC | PRN

## 2022-06-04 MED ORDER — EPINEPHRINE 1 MG/10ML IJ SOSY: 1.0000 mg | PREFILLED_SYRINGE | Freq: Once | INTRAMUSCULAR | Status: AC

## 2022-06-04 MED ORDER — SODIUM BICARBONATE 8.4 % IV SOLN: 100.0000 meq | Freq: Once | INTRAVENOUS | Status: AC

## 2022-06-04 MED ORDER — SODIUM BICARBONATE 8.4 % IV SOLN
INTRAVENOUS | Status: AC
  Filled 2022-06-04: qty 50

## 2022-06-04 MED ORDER — STERILE WATER FOR INJECTION IV SOLN
INTRAVENOUS | Status: DC
  Filled 2022-06-04: qty 150

## 2022-06-04 MED ORDER — SODIUM CHLORIDE 0.9 % IV SOLN: INTRAVENOUS | Status: DC

## 2022-06-04 MED ORDER — SODIUM BICARBONATE 8.4 % IV SOLN
INTRAVENOUS | Status: AC
  Administered 2022-06-04: 100 meq via INTRAVENOUS
  Filled 2022-06-04: qty 50

## 2022-06-04 MED ORDER — POLYVINYL ALCOHOL 1.4 % OP SOLN: 1.0000 [drp] | Freq: Four times a day (QID) | OPHTHALMIC | Status: DC | PRN

## 2022-06-04 MED ORDER — ACETAMINOPHEN 325 MG PO TABS: 650.0000 mg | ORAL_TABLET | Freq: Four times a day (QID) | ORAL | Status: DC | PRN

## 2022-06-04 MED ORDER — EPINEPHRINE HCL 5 MG/250ML IV SOLN IN NS
0.5000 ug/min | INTRAVENOUS | Status: DC
  Administered 2022-06-04: 0.5 ug/min via INTRAVENOUS
  Administered 2022-06-04: 10 ug/min via INTRAVENOUS
  Filled 2022-06-04: qty 250

## 2022-06-04 MED ORDER — EPINEPHRINE 1 MG/10ML IJ SOSY
PREFILLED_SYRINGE | INTRAMUSCULAR | Status: AC
  Administered 2022-06-04: 1 mg
  Filled 2022-06-04: qty 10

## 2022-06-04 MED ORDER — GLYCOPYRROLATE 0.2 MG/ML IJ SOLN: 0.2000 mg | INTRAMUSCULAR | Status: DC | PRN

## 2022-06-04 MED ORDER — SODIUM CHLORIDE 0.9 % IV SOLN: 250.0000 mL | INTRAVENOUS | Status: DC

## 2022-06-04 MED ORDER — GLYCOPYRROLATE 1 MG PO TABS: 1.0000 mg | ORAL_TABLET | ORAL | Status: DC | PRN

## 2022-06-05 ENCOUNTER — Encounter: Payer: Self-pay | Admitting: Internal Medicine

## 2022-06-13 NOTE — Progress Notes (Signed)
    1100  Spiritual Encounters  Type of Visit Follow up  Care provided to: Friend  Reason for visit Patient death  OnCall Visit Yes  Spiritual Framework  Presenting Themes Values and beliefs  Family Stress Factors Loss  Interventions  Spiritual Care Interventions Made Compassionate presence;Bereavement/grief support   Chaplain provided support to friend and care giver at patient's death.

## 2022-06-13 NOTE — Progress Notes (Signed)
NAME:  Jared Ball, MRN:  [id number], DOB:  09/19/1932, LOS: 2 ADMISSION DATE:  06/02/2022, CONSULTATION DATE: 06/02/2022 REFERRING MD: Dr. Rosalia Hammers, CHIEF COMPLAINT: Loss of Consciousness and Bradycardia    Brief Pt Description / Synopsis:  89 y.o. male admitted with Symptomatic bradycardia with cardiogenic shock, s/p temporary transvenous pacemaker placement, required intubation due to hemodynamic instability.  History of Present Illness:  This is an 89 yo male who presented to [facility] ER on 05/21 via EMS from [facility] [place] for evaluation following multiple syncopal episodes.  He had 2 syncopal episodes that was witnessed by his home health aide who lowered him to the ground.  He was later helped into a sitting position, but had a 3rd syncopal episode and had to be laid back on the ground prompting EMS notification.  Upon EMS arrival pts initial bp was 120/90 with a hr of 58.  However, upon standing pt had another syncopal episode witnessed by EMS his hr dropped to 38 and he became hypotensive bp 60/40.    ED Course  Upon arrival to the ER pt alert/oriented but reported he felt like he was going to pass out.  Initial vital signs: hr 30's/sbp 62-90/respiratory rate 17.  Pt subsequently became unresponsive and hypoxic requiring mechanical intubation.  Pt requiring levophed gtt to maintain map >65.  EKG revealed sinus bradycardia with a prolonged PR interval/heart rate 38.  Cardiologist Dr. Juliann Pares consulted and pt emergently transported to the cardiac cath lab.  PCCM contacted for ICU admission.  ER lab results: CO2 17/glucose 307/creatinine 1.47/calcium 8.1/BNP 687.4/TSH 9.310  CXR: The endotracheal tube is 4.3 cm above the carina. NG tube in good position. No acute cardiopulmonary findings.  Pertinent  Medical History  Anxiety  Arthritis  Atrial Fibrillation  BPH Bronchitis  Chronic Kidney Disease  Cognitive Changes  Type II Diabetes Mellitus  Dysrhythmia  GERD   Headache HOH Hypercholesteremia HTN IBS OSA (does not wear CPAP) Spinal Stenosis  Vertigo  Vitamin D Deficiency    Significant Hospital Events: Including procedures, antibiotic start and stop dates in addition to other pertinent events   05/21: Pt admitted with syncope secondary to cardiogenic shock requiring mechanical intubation and temporary transvenous pacemaker placement  05/22: On minimal vent support, tolerating SBT ~ EXTUBATED. Cardiology is ok with discontinuation of temporary TV pacer, however pt requesting to leave it in for today.  Troponin increased to 3308, plan to start Heparin gtt for 24-48 hrs. 05/23: Developed bradycardia (HR 20's) with profound refractory shock despite temporary venous pacing and multiple pressors.  Pt is in the dying process.  Decision made by family to transition to COMFORT MEASURES only.  Pt expired shortly after care withdrawn  Interim History / Subjective:  -This morning pt suddenly became bradycardic ~ temporary TV pacer turned on ~ despite pacer pt became symptomatic with profound/refractory shock -Required Levo, Vasopressin, and Epi infusions  ~ still with intermittent hypotension which was responsive to multiple bicarb and epi pushes ~ as soon as pushes would wear off pt again severely hypotensive (SBP 50's, pulse becoming weak and thready) -Labs and workup for etiology of shock is currently pending -Pt is DNR, confirmed with pts' POA Amy that he would not want to be placed back on ventilator ~ maintaining his O2 sats on BiPAP  -Pt in the dying process, anticipate he will not survive the next few hours -After meeting with pt's POA Amy at bedside ~ decision made to transition to COMFORT MEASURES  Objective  Blood pressure (!) 125/90, pulse (!) 59, temperature 98.4 F (36.9 C), temperature source Oral, resp. rate 20, height 5\' 10"  (1.778 m), weight 105.5 kg, SpO2 95 %.    FiO2 (%):  [60 %] 60 %   Intake/Output Summary (Last 24 hours) at   1610 Last data filed at  0600 Gross per 24 hour  Intake 682.08 ml  Output 450 ml  Net 232.08 ml    Filed Weights   06/02/22 1500 06/03/22 0500  Weight: 105.2 kg 105.5 kg    Examination: General: Critically-ill appearing male, laying in bed, somnolent, in profound shock HENT: Supple, no JVD  Lungs: Faint rhonchi throughout, even, non labored  Cardiovascular: Bradycardia then paced rhythm, s1s2, no m/r/g, 2+ radial/1+ distal pulses, no edema  Abdomen: +BS x4, non tender, non distended  Extremities: Normal bulk and tone, no deformities, no edema Neuro: Somnolent, intermittently arouses to voice/pain but falls back asleep during conversation, pupils PERRL GU: Foley catheter in place draining red colored urine  Resolved Hospital Problem list     Assessment & Plan:   #Profound/Refractory Shock: suspect Cardiogenic due to symptomatic bradycardia s/p temporary transvenous pacemaker placement, exacerbated by severe metabolic acidosis #Syncopal episodes due to above #Elevated troponin's suspect secondary to demand ischemia vs NSTEMI Hx: Atrial fibrillation, HTN, multivessel coronary disease, and hypercholesteremia  Echocardiogram 06/03/22: LVEF 55-60%, moderate LVH, Grade I DD, RV systolic function is normal, mildly elevated pulmonary artery systolic pressure, mild to moderate TR -Continuous cardiac monitoring -Maintain MAP >65 -Vasopressors as needed to maintain MAP goal (Levophed, vasopressin , Epi infusions) -Trend lactic acid until normalized -HS Troponin peaked at 3308 -TSH elevated at 9.3, free T4 slightly elevated at 1.43 -Avoid AV nodal blocking medications and hold outpatient amiodarone  -Temporary transvenous pacemaker turned on 5/23 -Cardiology following, appreciate input: no plans for invasive evaluation for ischemia as pt has refused coronary bypass surgery following previous cardiac cath results revealed multivessel disease ~ deemed not a candidate for  at this time for permanent pacer -Pt did not tolerate Heparin gtt on 5/22 ~ developed bleeding at pacer site and hematuria   #Acute hypoxic respiratory failure due to hemodynamic instability #Mechanical intubation  Hx: OSA  EXTUBATED 5/22 -Supplemental O2 as needed to maintain O2 sats >92% -BiPAP as needed (pt's POA confirms 5/23 that she and pt would not want him to be placed back on ventilator) -Follow intermittent Chest X-ray & ABG as needed -Bronchodilators PRN -Pulmonary toilet as able  #AKI on Stage IIIa CKD  #AG Metabolic Acidosis due to lactic acidosis -Monitor I&O's / urinary output -Follow BMP -Ensure adequate renal perfusion -Avoid nephrotoxic agents as able -Replace electrolytes as indicated -Bicarb gtt -Trend lactic  #Type II diabetes mellitus  Hemoglobin A1c 7.7 on 06/02/22 -CBG's q4h; Target range of 140 to 180 -SSI -Follow ICU Hypo/Hyperglycemia protocol  #Acute metabolic Encephalopathy due to profound shock -Treatment of shock and metabolic derangements as outlined above -Provide supportive care -Avoid sedating meds as able   Best Practice (right click and "Reselect all SmartList Selections" daily)  Diet/type: NPO,  DVT prophylaxis: SCD  GI prophylaxis: H2B Lines: Right femoral temporary transvenous pacemaker  Foley:  yes, and is still needed Code Status:  DNR Last date of multidisciplinary goals of care discussion []  5/23:: Pt's caretaker Amy (POA) updated at bedside.  All questions answered.  Labs   CBC: Recent Labs  Lab 06/02/22 1505  0355  WBC 8.9 11.0*  HGB 13.9 13.2  HCT 44.4 41.3  MCV  91.0 89.0  PLT 194 176     Basic Metabolic Panel: Recent Labs  Lab 06/02/22 1505 06/02/22 1813 06/03/22 0510  0355  NA 135  --  139 135  K 3.8  --  3.6 3.9  CL 106  --  104 103  CO2 17*  --  27 24  GLUCOSE 307*  --  128* 127*  BUN 21  --  23 28*  CREATININE 1.47*  --  1.30* 1.44*  CALCIUM 8.1*  --  8.7* 8.4*  MG 2.1  2.2 2.1 2.3  PHOS 4.4  --   --   --     GFR: Estimated Creatinine Clearance: 43.1 mL/min (A) (by C-G formula based on SCr of 1.44 mg/dL (H)). Recent Labs  Lab 06/02/22 1505 06/02/22 1805 06/02/22 1921  0355  WBC 8.9  --   --  11.0*  LATICACIDVEN  --  1.6 2.2*  --      Liver Function Tests: Recent Labs  Lab 06/02/22 1813  AST 58*  ALT 41  ALKPHOS 73  BILITOT 1.5*  PROT 6.4*  ALBUMIN 3.4*    No results for input(s): "LIPASE", "AMYLASE" in the last 168 hours. No results for input(s): "AMMONIA" in the last 168 hours.  ABG    Component Value Date/Time   PHART 7.42 06/02/2022 1802   PCO2ART 39 06/02/2022 1802   PO2ART 67 (L) 06/02/2022 1802   HCO3 25.3 06/02/2022 1802   O2SAT 92.5 06/02/2022 1802     Coagulation Profile: No results for input(s): "INR", "PROTIME" in the last 168 hours.  Cardiac Enzymes: No results for input(s): "CKTOTAL", "CKMB", "CKMBINDEX", "TROPONINI" in the last 168 hours.  HbA1C: Hgb A1c MFr Bld  Date/Time Value Ref Range Status  06/02/2022 06:05 PM 7.7 (H) 4.8 - 5.6 % Final    Comment:    (NOTE) Pre diabetes:          5.7%-6.4%  Diabetes:              >6.4%  Glycemic control for   <7.0% adults with diabetes   11/26/2020 11:14 AM 7.2 (H) 4.8 - 5.6 % Final    Comment:    (NOTE) Pre diabetes:          5.7%-6.4%  Diabetes:              >6.4%  Glycemic control for   <7.0% adults with diabetes     CBG: Recent Labs  Lab 06/03/22 1516 06/03/22 1930 06/03/22 2356  0357  0806  GLUCAP 193* 185* 122* 125* 174*     Review of Systems:   Unable to assess pt due to AMS  Past Medical History:  He,  has a past medical history of Anxiety, Arthritis, Atrial fibrillation (HCC), BPH (benign prostatic hyperplasia), Bronchitis, Chronic kidney disease, Cognitive changes, Diabetes mellitus without complication (HCC), Dysrhythmia, GERD (gastroesophageal reflux disease), Headache, History of kidney stones, HOH (hard  of hearing), Hypercholesteremia, Hypertension, IBS (irritable bowel syndrome), PONV (postoperative nausea and vomiting), Shingles (15 yrs ago), Shortness of breath dyspnea, Sleep apnea, Spinal stenosis, Vertigo, and Vitamin D deficiency.   Surgical History:   Past Surgical History:  Procedure Laterality Date   CAROTID PTA/STENT INTERVENTION Left 11/26/2020   Procedure: CAROTID PTA/STENT INTERVENTION;  Surgeon: Renford Dills, MD;  Location: [facility] INVASIVE CV LAB;  Service: Cardiovascular;  Laterality: Left;   CATARACT EXTRACTION W/PHACO Left 05/17/2019   Procedure: CATARACT EXTRACTION PHACO AND INTRAOCULAR LENS PLACEMENT (IOC) LEFT DIABETIC MALYUGIN;  Surgeon: Inez Pilgrim,  Radene Knee, MD;  [facility];  Service: Ophthalmology;  Laterality: Left;  16.54 1:31.1 18.2%   CATARACT EXTRACTION W/PHACO Right 07/12/2019   Procedure: CATARACT EXTRACTION PHACO AND INTRAOCULAR LENS PLACEMENT (IOC) RIGHT MALYUGIN DIABETIC 14.90 01:37.8 15.3%;  Surgeon: Lockie Mola, MD;  Location: [facility];  Service: Ophthalmology;  Laterality: Right;  Diabetic - oral meds Latex   COLONOSCOPY     CYSTOSCOPY W/ RETROGRADES Bilateral 07/02/2014   Procedure: CYSTOSCOPY WITH RETROGRADE PYELOGRAM;  Surgeon: Vanna Scotland, MD;  Location: [facility] ORS;  Service: Urology;  Laterality: Bilateral;   CYSTOSCOPY WITH STENT PLACEMENT Left 07/02/2014   Procedure: CYSTOSCOPY WITH STENT PLACEMENT;  Surgeon: Vanna Scotland, MD;  Location: [facility] ORS;  Service: Urology;  Laterality: Left;   DENTAL SURGERY     in the [employer]   LEFT HEART CATH AND CORONARY ANGIOGRAPHY Left 11/09/2019   Procedure: LEFT HEART CATH AND CORONARY ANGIOGRAPHY with Radial Approach;  Surgeon: Alwyn Pea, MD;  Location: [facility] INVASIVE CV LAB;  Service: Cardiovascular;  Laterality: Left;   TONSILLECTOMY     and adenoids   URETEROSCOPY WITH HOLMIUM LASER LITHOTRIPSY Left 07/02/2014   Procedure: URETEROSCOPY WITH HOLMIUM LASER  LITHOTRIPSY;  Surgeon: Vanna Scotland, MD;  Location: [facility] ORS;  Service: Urology;  Laterality: Left;     Social History:   reports that he quit smoking about 34 years ago. His smoking use included cigarettes. He has never used smokeless tobacco. He reports current alcohol use. He reports that he does not use drugs.   Family History:  His family history includes Kidney disease in his mother. There is no history of Prostate cancer, Kidney cancer, or Bladder Cancer.   Allergies Allergies  Allergen Reactions   Sulfa Antibiotics Nausea And Vomiting and Other (See Comments)   Tape Rash    adhesive   Metformin Diarrhea    High dose gives diarrhea    Sulfasalazine Nausea Only and Nausea And Vomiting   Zoster Vaccine Live Hives and Rash    Localized; Vaccine for shingles      Home Medications  Prior to Admission medications   Medication Sig Start Date End Date Taking? Authorizing Provider  acetaminophen (TYLENOL) 500 MG tablet Take 500 mg by mouth every 8 (eight) hours as needed for mild pain or moderate pain.    [provider]  amiodarone (PACERONE) 100 MG tablet Take 100 mg by mouth daily. 12/14/19   [provider]  APPLE CIDER VINEGAR PO Take 7 g by mouth once a week.    [provider]  aspirin EC 81 MG tablet Take 81 mg by mouth daily.     [provider]  bisacodyl (DULCOLAX) 5 MG EC tablet Take 5 mg by mouth daily as needed for moderate constipation.    [provider]  BLACK ELDERBERRY PO Take 300 mg by mouth See admin instructions. Couple times a month    [provider]  calcium elemental as carbonate (TUMS ULTRA 1000) 400 MG chewable tablet Chew 1,000 mg by mouth daily as needed for heartburn.    [provider]  carboxymethylcellulose (REFRESH PLUS) 0.5 % SOLN Place 1 drop into both eyes daily.    [provider]  Cholecalciferol 25 MCG (1000 UT) tablet Take 1,000 Units by mouth daily.  08/28/16   [provider]  clopidogrel (PLAVIX) 75 MG tablet Take 75 mg by mouth daily. 01/18/20   [provider]  Cyanocobalamin 1000 MCG CAPS Take 1,000 mcg by mouth  daily. Gummies    [provider]  Dulaglutide (TRULICITY) 1.5 MG/0.5ML SOPN Inject 1.5 mg into the skin once a week. 12/02/21     Dulaglutide (TRULICITY) 1.5 MG/0.5ML SOPN Inject 1.5 mg into the skin once a week. 04/27/22     finasteride (PROSCAR) 5 MG tablet Take 1 tablet (5 mg total) by mouth daily. 06/01/22   Michiel Cowboy A, PA-C  fluticasone (FLONASE) 50 MCG/ACT nasal spray Place 1 spray into the nose 2 (two) times daily as needed for allergies. 04/28/12   [provider]  isosorbide mononitrate (IMDUR) 60 MG 24 hr tablet Take 60 mg by mouth daily.    [provider]  loperamide (IMODIUM A-D) 2 MG tablet Take 2 mg by mouth 3 (three) times daily as needed for diarrhea or loose stools.     [provider]  lovastatin (MEVACOR) 40 MG tablet Take 40 mg by mouth at bedtime.  03/12/16   [provider]  meclizine (ANTIVERT) 12.5 MG tablet Take 12.5 mg by mouth 2 (two) times daily as needed. 04/29/21   [provider]  metFORMIN (GLUCOPHAGE) 500 MG tablet Take 500 mg by mouth at bedtime. 06/09/16   [provider]  nystatin-triamcinolone ointment (MYCOLOG) Apply 1 application. topically 2 (two) times daily. 05/06/21   Michiel Cowboy A, PA-C  pantoprazole (PROTONIX) 40 MG tablet Take 40 mg by mouth daily.  01/24/18   [provider]  Pramox-PE-Glycerin-Petrolatum (PREPARATION H) 1-0.25-14.4-15 % CREA Apply 1 Applicatorful topically daily as needed (Hemorrhoids).    [provider]  ranolazine (RANEXA) 500 MG 12 hr tablet Take 500 mg by mouth 2 (two) times daily.  03/22/18   [provider]  tamsulosin (FLOMAX) 0.4 MG CAPS capsule Take 1 capsule (0.4 mg total) by mouth daily. 06/01/22   Michiel Cowboy A, PA-C  torsemide (DEMADEX) 5 MG tablet Take 5 mg by  mouth daily.  05/29/15   [provider]  triamcinolone ointment (KENALOG) 0.1 % Apply 1 application  topically daily as needed (rash). 09/20/20   [provider]  TRULICITY 0.75 MG/0.5ML SOPN SMARTSIG:0.5 Milliliter(s) SUB-Q Once a Week 08/07/21   [provider]     Critical care time: 50 minutes     Harlon Ditty, AGACNP-BC [facility] Prefer epic messenger for cross cover needs If after hours, please call E-link

## 2022-06-13 NOTE — IPAL (Signed)
  Interdisciplinary Goals of Care Family Meeting   Date carried out:   Location of the meeting: Phone conference  Member's involved: Physician, Nurse Practitioner, Bedside Registered Nurse, and Family Member or next of kin    GOALS OF CARE DISCUSSION  The Clinical status was relayed to family in detail- Clydie Braun the Niece on ly living relative  Cell-[phone] Work [phone]  Updated and notified of patients medical condition- Patient remains unresponsive and will not open eyes to command.   Patient is having a weak cough and struggling to remove secretions.   Patient with increased WOB and using accessory muscles to breathe Explained to family course of therapy and the modalities   Patient with Progressive multiorgan failure with a very high probablity of a very minimal chance of meaningful recovery despite all aggressive and optimal medical therapy.  PATIENT REMAINS DNR BUT WILL INTUBATED IF NEEDED PATIENT WILL NEED EMERGENT PACEMAKER  Family understands the situation.  They have consented and agreed to DNR STATUS  Family are satisfied with Plan of action and management. All questions answered  Additional CC time 45 mins   Kaija Kovacevic Santiago Glad, M.D.  [facility]  Medical Director [facility] Medical Director [facility] Cardio-Pulmonary Department

## 2022-06-13 NOTE — Procedures (Signed)
Central Venous Catheter Insertion Procedure Note  Lenzie Barbieri  [id number]  [birth year]  Date:  Time:9:25 AM   Provider Performing:Cailan Antonucci D Elvina Sidle   Procedure: Insertion of Non-tunneled Central Venous Catheter(36556) with US guidance (40981)   Indication(s) Medication administration and Difficult access  Consent Unable to obtain consent due to emergent nature of procedure.  Anesthesia Topical only with 1% lidocaine   Timeout Verified patient identification, verified procedure, site/side was marked, verified correct patient position, special equipment/implants available, medications/allergies/relevant history reviewed, required imaging and test results available.  Sterile Technique Maximal sterile technique including full sterile barrier drape, hand hygiene, sterile gown, sterile gloves, mask, hair covering, sterile ultrasound probe cover (if used).  Procedure Description Area of catheter insertion was cleaned with chlorhexidine and draped in sterile fashion.  With real-time ultrasound guidance a central venous catheter was placed into the left femoral vein. Nonpulsatile blood flow and easy flushing noted in all ports.  The catheter was sutured in place and sterile dressing applied.  Complications/Tolerance None; patient tolerated the procedure well. Chest X-ray is ordered to verify placement for internal jugular or subclavian cannulation.   Chest x-ray is not ordered for femoral cannulation.  EBL Minimal  Specimen(s) None   Line secured at the 20 cm mark. BIOPATCH applied to the insertion site.   Harlon Ditty, AGACNP-BC [facility] Prefer epic messenger for cross cover needs If after hours, please call E-link

## 2022-06-13 NOTE — Progress Notes (Signed)
0730-pts hr dropped to 25. I turned temp pacer on to original settings of 80bpm. Jeri Modena NP and caro. Was notified.   0800-Pts bp has dropped to 70s systolic NP at bedside and orders received.  0900-Multiple pressers were started on pt. Maps continue to drop. Multiple iv pushes were given. Cvc was placed.  1022-time of death.

## 2022-06-13 NOTE — Progress Notes (Signed)
    1000  Spiritual Encounters  Type of Visit Initial  Care provided to: Friend  Referral source Nurse (RN/NT/LPN)  Reason for visit End-of-life  OnCall Visit Yes   Chaplain invited to be ad bedside by care team as patient is placed on comfort care. Chaplain provided compassionate care to friend and care giver of patient as she reflected on the Biblical hope and assurance of future life for patient.

## 2022-06-13 NOTE — Progress Notes (Signed)
[facility] CLINIC CARDIOLOGY PROGRESS NOTE   Patient ID: Jared Ball MRN: [id number] DOB/AGE: [birth year] 89 y.o.  Admit date: 06/02/2022 Referring Physician Dr. Trinna Post Primary Physician Dr. Einar Crow Primary Cardiologist Dr. Dorothyann Peng Reason for Consultation symptomatic bradycardia  HPI: Jared Ball is a 89 y.o. male who presented to the ED on 06/02/2022 for syncope. They have a past medical history significant for multivessel coronary artery disease, paroxysmal atrial fibrillation, hypertension, hyperlipidemia, OSA on CPAP, type 2 diabetes, chronic kidney disease stage III.  Over the last 1 to 2 months patient has noted an overall decrease in functional status with significant fatigue with minimal exertion.  He reportedly had multiple episodes of syncope yesterday at his assisted living facility and thus was brought to the ED for evaluation.  Heart rate noted to be in the 30s on EKG.  While in the ED he became less responsive and ultimately was intubated.  Cardiology was consulted for symptomatic bradycardia, need for temporary pacemaker which was placed emergently by Dr. Juliann Pares on 5/21.   Interval History:  - overnight events noted, temp wire had been off but left in place yesterday. This AM on tele noted sinus brady down to the 20s and patient became hypotensive/unstable. R groin temp wire back on at 80bpm also on BIPAP and on levo for BP support - patient is DNR, amenable to PPM   Review of systems limited by somnolence   Vitals:    0500  0600  0700  0800  BP: (!) 124/59 (!) 120/59 (!) 125/90   Pulse: (!) 57 (!) 56 (!) 59   Resp: 17 17 20    Temp:  98.4 F (36.9 C)    TempSrc:  Oral    SpO2: (!) 89% 91% 94% 95%  Weight:      Height:         Intake/Output Summary (Last 24 hours) at  4098 Last data filed at  0600 Gross per 24 hour  Intake 682.08 ml  Output 450 ml  Net 232.08 ml      PHYSICAL EXAM General:  acutely ill appearing elderly male laying at incline in ICU bed with PCCM NP and nurse present. On BIPAP HEENT: Normocephalic and atraumatic. Neck: No JVD.  Lungs: on BIPAP without apparent increased work of breathing Heart: V paced 80s Abdomen: Non-distended appearing.  Msk: Normal strength and tone for age. Extremities: No clubbing, cyanosis. R groin temp wire present Neuro: somnolent, awakens to loud voice and states he is sleepy. Falls asleep quickly  Psych: unable to assess    LABS: Basic Metabolic Panel: Recent Labs    06/02/22 1505 06/02/22 1813 06/03/22 0510  0355  NA 135  --  139 135  K 3.8  --  3.6 3.9  CL 106  --  104 103  CO2 17*  --  27 24  GLUCOSE 307*  --  128* 127*  BUN 21  --  23 28*  CREATININE 1.47*  --  1.30* 1.44*  CALCIUM 8.1*  --  8.7* 8.4*  MG 2.1   < > 2.1 2.3  PHOS 4.4  --   --   --    < > = values in this interval not displayed.    Liver Function Tests: Recent Labs    06/02/22 1813  AST 58*  ALT 41  ALKPHOS 73  BILITOT 1.5*  PROT 6.4*  ALBUMIN 3.4*    No results for input(s): "LIPASE", "AMYLASE" in the last 72 hours. CBC: Recent Labs  06/02/22 1505  0355  WBC 8.9 11.0*  HGB 13.9 13.2  HCT 44.4 41.3  MCV 91.0 89.0  PLT 194 176    Cardiac Enzymes: Recent Labs    06/03/22 0510 06/03/22 1024 06/03/22 1306  TROPONINIHS 3,308* 2,586* 1,836*    BNP: Recent Labs    06/02/22 1505  BNP 687.4*    D-Dimer: No results for input(s): "DDIMER" in the last 72 hours. Hemoglobin A1C: Recent Labs    06/02/22 1805  HGBA1C 7.7*    Fasting Lipid Panel: Recent Labs    06/03/22 0510  CHOL 150  HDL 62  LDLCALC 66  TRIG 109  CHOLHDL 2.4   Thyroid Function Tests: Recent Labs    06/02/22 1505  TSH 9.310*    Anemia Panel: No results for input(s): "VITAMINB12", "FOLATE", "FERRITIN", "TIBC", "IRON", "RETICCTPCT" in the last 72 hours.  CARDIAC CATHETERIZATION  Result Date:  Conclusion:  Successful placement of temporary pacer wire right femoral vein approach with adequate position capture Pacemaker was turned off because patient intrinsic rhythm improved temporarily to 70 sinus. Pacemaker was left in place in case of further bradycardia arrhythmias Will make determination about placement of permanent pacemaker   ECHOCARDIOGRAM COMPLETE  Result Date: 06/03/2022    ECHOCARDIOGRAM REPORT   Patient Name:   Jared Ball Date of Exam: 06/03/2022 Medical Rec #:  [id number]   Height:       70.0 in Accession #:    [id number]  Weight:       232.6 lb Date of Birth:  1932-05-13   BSA:          2.226 m Patient Age:    89 years    BP:           147/63 mmHg Patient Gender: M           HR:           63 bpm. Exam Location:  [facility] Procedure: 2D Echo, Cardiac Doppler, Color Doppler and Intracardiac            Opacification Agent Indications:     Abnormal ECG  History:         Patient has no prior history of Echocardiogram examinations.                  Abnormal ECG, Arrythmias:Atrial Fibrillation and PVC,                  Signs/Symptoms:Shortness of Breath; Risk Factors:Hypertension,                  Diabetes and Dyslipidemia.  Sonographer:     Mikki Harbor Referring Phys:  [id number] Neldon Newport NELSON Diagnosing Phys: Yvonne Kendall MD  Sonographer Comments: Technically difficult study due to poor echo windows, suboptimal parasternal window and suboptimal apical window. Image acquisition challenging due to respiratory motion. IMPRESSIONS  1. Left ventricular ejection fraction, by estimation, is 55 to 60%. The left ventricle has normal function. The left ventricle has no regional wall motion abnormalities. There is moderate left ventricular hypertrophy. Left ventricular diastolic parameters are consistent with Grade I diastolic dysfunction (impaired relaxation).  2. Right ventricular systolic function is normal. The right ventricular size is not well visualized. Mildly increased right ventricular wall thickness. There  is mildly elevated pulmonary artery systolic pressure.  3. The mitral valve was not well visualized. No evidence of mitral valve regurgitation. No evidence of mitral stenosis.  4. Tricuspid valve regurgitation is mild to moderate.  5. The aortic  valve was not well visualized. There is mild calcification of the aortic valve. There is mild thickening of the aortic valve. Aortic valve regurgitation is not visualized. Aortic valve sclerosis/calcification is present, without any evidence of aortic stenosis.  6. The inferior vena cava is dilated in size with <50% respiratory variability, suggesting right atrial pressure of 15 mmHg. FINDINGS  Left Ventricle: Left ventricular ejection fraction, by estimation, is 55 to 60%. The left ventricle has normal function. The left ventricle has no regional wall motion abnormalities. Definity contrast agent was given IV to delineate the left ventricular  endocardial borders. The left ventricular internal cavity size was normal in size. There is moderate left ventricular hypertrophy. Left ventricular diastolic parameters are consistent with Grade I diastolic dysfunction (impaired relaxation). Right Ventricle: The right ventricular size is not well visualized. Mildly increased right ventricular wall thickness. Right ventricular systolic function is normal. There is mildly elevated pulmonary artery systolic pressure. The tricuspid regurgitant velocity is 2.62 m/s, and with an assumed right atrial pressure of 15 mmHg, the estimated right ventricular systolic pressure is 42.5 mmHg. Left Atrium: Left atrial size was normal in size. Right Atrium: Right atrial size was not well visualized. Pericardium: There is no evidence of pericardial effusion. Mitral Valve: The mitral valve was not well visualized. There is mild thickening of the mitral valve leaflet(s). There is mild calcification of the mitral valve leaflet(s). No evidence of mitral valve regurgitation. No evidence of mitral valve  stenosis. MV peak gradient, 3.4 mmHg. The mean mitral valve gradient is 1.0 mmHg. Tricuspid Valve: The tricuspid valve is not well visualized. Tricuspid valve regurgitation is mild to moderate. Aortic Valve: The aortic valve was not well visualized. There is mild calcification of the aortic valve. There is mild thickening of the aortic valve. Aortic valve regurgitation is not visualized. Aortic valve sclerosis/calcification is present, without any evidence of aortic stenosis. Aortic valve mean gradient measures 1.0 mmHg. Aortic valve peak gradient measures 3.0 mmHg. Aortic valve area, by VTI measures 3.81 cm. Pulmonic Valve: The pulmonic valve was not well visualized. Aorta: The aortic root is normal in size and structure. Venous: The inferior vena cava is dilated in size with less than 50% respiratory variability, suggesting right atrial pressure of 15 mmHg. IAS/Shunts: The interatrial septum was not well visualized.  LEFT VENTRICLE PLAX 2D LVIDd:         4.90 cm   Diastology LVIDs:         3.60 cm   LV e' medial:    6.64 cm/s LV PW:         1.30 cm   LV E/e' medial:  7.5 LV IVS:        1.40 cm   LV e' lateral:   7.62 cm/s LVOT diam:     1.90 cm   LV E/e' lateral: 6.5 LV SV:         67 LV SV Index:   30 LVOT Area:     2.84 cm  LEFT ATRIUM             Index LA diam:        3.30 cm 1.48 cm/m LA Vol (A2C):   51.4 ml 23.09 ml/m LA Vol (A4C):   27.3 ml 12.27 ml/m LA Biplane Vol: 37.6 ml 16.89 ml/m  AORTIC VALVE AV Area (Vmax):    3.70 cm AV Area (Vmean):   3.46 cm AV Area (VTI):     3.81 cm AV Vmax:  86.60 cm/s AV Vmean:          53.100 cm/s AV VTI:            0.177 m AV Peak Grad:      3.0 mmHg AV Mean Grad:      1.0 mmHg LVOT Vmax:         113.00 cm/s LVOT Vmean:        64.800 cm/s LVOT VTI:          0.238 m LVOT/AV VTI ratio: 1.34  AORTA Ao Root diam: 3.20 cm MITRAL VALVE               TRICUSPID VALVE MV Area (PHT): 2.57 cm    TR Peak grad:   27.5 mmHg MV Area VTI:   2.27 cm    TR Vmax:         262.00 cm/s MV Peak grad:  3.4 mmHg MV Mean grad:  1.0 mmHg    SHUNTS MV Vmax:       0.92 m/s    Systemic VTI:  0.24 m MV Vmean:      45.7 cm/s   Systemic Diam: 1.90 cm MV Decel Time: 295 msec MV E velocity: 49.80 cm/s MV A velocity: 94.30 cm/s MV E/A ratio:  0.53 Cristal Deer End MD Electronically signed by Yvonne Kendall MD Signature Date/Time: 06/03/2022/5:51:50 PM    Final    DG Chest Port 1 View  Result Date: 06/03/2022 CLINICAL DATA:  Bradycardia. EXAM: PORTABLE CHEST 1 VIEW COMPARISON:  Earlier radiograph dated 06/02/2022. FINDINGS: Similar positioning of the endotracheal and enteric tubes. No interval change in the left lung base opacity, likely given tray shin of the left hemidiaphragm. No new consolidation. There is no pleural effusion or pneumothorax. Stable cardiac silhouette. Atherosclerotic calcification of the aorta. No acute osseous pathology. IMPRESSION: No interval change. Electronically Signed   By: Elgie Collard M.D.   On: 06/03/2022 02:09   DG Chest Portable 1 View  Result Date: 06/02/2022 CLINICAL DATA:  Intubation. EXAM: PORTABLE CHEST 1 VIEW COMPARISON:  Prior chest x-ray from 2015 FINDINGS: The endotracheal tube is 4.3 cm above the carina. The NG tube is coursing down the esophagus and into the stomach. The heart is mildly enlarged. The mediastinal and hilar contours are prominent but this could be due to the AP projection, portable technique and supine position of the patient. Stable moderate eventration of the left hemidiaphragm. No infiltrates, effusions or pneumothorax. IMPRESSION: 1. The endotracheal tube is 4.3 cm above the carina. 2. NG tube in good position. 3. No acute cardiopulmonary findings. Electronically Signed   By: Rudie Meyer M.D.   On: 06/02/2022 16:50     TELEMETRY reviewed by me : Sinus brady 50s to NSR 60s overnight, this AM sinus brady down to 20s, temp wire turned back on with rate 80s  EKG reviewed by me : Marked sinus bradycardia with  PVC, heart rate 36  DATA reviewed by me : PCCM notes, nursing notes, last 24h vitals tele labs imaging I/O   ASSESSMENT AND PLAN:  Principal Problem:   Acute respiratory failure with hypoxia (HCC) Active Problems:   Other specified hypotension   # Syncope # Symptomatic Bradycardia  # Hx Atrial Fibrillation Patient presented from assisted living facility after multiple witnessed episodes of syncope. HR on presentation to the ED was noted to be in the 30s.   -S/p atropine x 1 in the ED -S/p temporary pacemaker 5/21, was not turned on as his rate  improved.  -Dr. Darrold Junker and I evaluated the patient at the bedside this AM, temp wire pacing at 80s, on BIPAP and hemodynamically unstable requiring levophed for BP support, patient is altered and unable to communicate with [place]. Dr. Darrold Junker does not recommended proceeding with emergent PPM until the patient is stabilized. Temp wire is appropriately pacing).  - ongoing GOC discussions with family   # Cardiogenic shock # Acute respiratory failure Initially upon presentation to the ED patient was alert and oriented however he ultimately became altered and less responsive and thus requiring intubation and mechanical ventilation. Back on BIPAP this AM  + pressors as above   # Demand Ischemia vs. ACS # Multivessel Coronary Artery Disease # Carotid artery stenosis s/p left ICA stent 11/2020 Patient with a known history of severe multivessel disease from cardiac cath, he has declined CABG in the past.  -Troponin in the ED 336 > 3308 this a.m. Heparin infusion ok for 24 to 48 hours after removal of temp wire -Defer invasive cardiac evaluation -Medical management of coronary disease with home statin, DAPT.  Will restart home Ranexa for management of anginal symptoms.  # Lower extremity edema -BNP 687 on admission -Will restart torsemide at 10 mg daily.  Monitor renal function closely  # Chronic kidney disease stage IIIa -Creatinine 1.3  this a.m. trended down from 1.47 yesterday -Continue to monitor closely  This patient's case was discussed and created with Dr. Darrold Junker and he is in agreement.  Jodie Echevaria, PA-C  , 8:42 AM [facility] Cardiology

## 2022-06-13 NOTE — Progress Notes (Signed)
Nutrition Brief Note  Chart reviewed. Pt now transitioning to comfort care.  No further nutrition interventions planned at this time.  Please re-consult as needed.   Yissel Habermehl W, RD, LDN, CDCES Registered Dietitian II Certified Diabetes Care and Education Specialist Please refer to AMION for RD and/or RD on-call/weekend/after hours pager   

## 2022-06-13 NOTE — IPAL (Signed)
  Interdisciplinary Goals of Care Family Meeting   Date carried out:   Location of the meeting: Bedside  Member's involved: Nurse Practitioner, Bedside Registered Nurse, and Family Member or next of kin  Durable Power of Attorney or acting medical decision maker: Pt's care provider Jared Ball (pt's POA).  Attempted to call pt's niece Jared Ball (apparently they are estranged) but no answer.  Discussion: We discussed goals of care for Jared Ball .  Discussed abrupt decline this morning which included development of bradycardia with associated shock requiring turning back on temporary venous pacer and multiple vasopressors.  Pt very also altered and somnolent.  Placed on BiPAP and able to maintain O2 sats.  Despite multiple pressor infusions (Levo, Vasopressin, Epi), pt is requiring frequent pushes of Bicarb and Epi to maintain BP, and when the pushes wear off he becomes severely hypotensive again.  He is declining and in the dying process.  Pt is DNR status, and she confirms that she (per his wishes per their conversation yesterday) would not want to be placed back on the ventilator.  She understands his heart is failing and doesn't want him to suffer.  She is in agreement with transitioning to COMFORT MEASURES and withdrawing all life sustaining treatments.    Code status:   Code Status: DNR   Disposition: In-patient comfort care  Time spent for the meeting: 15 minutes    Harlon Ditty, AGACNP-BC [facility] Prefer epic messenger for cross cover needs If after hours, please call E-link  Judithe Modest, NP  , 10:11 AM

## 2022-06-13 NOTE — Procedures (Signed)
Arterial Catheter Insertion Procedure Note  Jared Ball  [id number]  December 18, 1932  Date:  Time:9:25 AM    Provider Performing: Judithe Modest    Procedure: Insertion of Arterial Line (40981) with US guidance (19147)   Indication(s) Blood pressure monitoring and/or need for frequent ABGs  Consent Unable to obtain consent due to emergent nature of procedure.  Anesthesia None   Time Out Verified patient identification, verified procedure, site/side was marked, verified correct patient position, special equipment/implants available, medications/allergies/relevant history reviewed, required imaging and test results available.   Sterile Technique Maximal sterile technique including full sterile barrier drape, hand hygiene, sterile gown, sterile gloves, mask, hair covering, sterile ultrasound probe cover (if used).   Procedure Description Area of catheter insertion was cleaned with chlorhexidine and draped in sterile fashion. With real-time ultrasound guidance an arterial catheter was placed into the left radial artery.  Appropriate arterial tracings confirmed on monitor.     Complications/Tolerance None; patient tolerated the procedure well.   EBL Minimal   Specimen(s) None    Harlon Ditty, AGACNP-BC [facility] Prefer epic messenger for cross cover needs If after hours, please call E-link

## 2022-06-13 NOTE — Death Summary Note (Signed)
DEATH SUMMARY   Patient Details  Name: Jared Ball MRN: [id number] DOB: Nov 21, 1932  Admission/Discharge Information   Admit Date:  2022-06-18  Date of Death:  2022-06-20  Time of Death:  10:22 am  Length of Stay: 2  Referring Physician: Lauro Regulus, MD   Reason(s) for Hospitalization  Cardiogenic shock Symptomatic Bradycardia Syncope Elevated Troponin in setting of NSTEMI vs Demand Ischemia Acute Hypoxic Respiratory Failure Acute Kidney Injury on Stage IIIa CKD Type II Diabetes Mellitus Anion Gap Metabolic Acidosis Lactic Acidosis  Diagnoses  Preliminary cause of death: Cardiogenic shock Secondary Diagnoses (including complications and co-morbidities):  Principal Problem:   Acute respiratory failure with hypoxia (HCC) Active Problems:   Other specified hypotension Symptomatic Bradycardia Syncope Elevated Troponin in setting of NSTEMI vs Demand Ischemia Acute Hypoxic Respiratory Failure Acute Kidney Injury on Stage IIIa CKD Type II Diabetes Mellitus Anion Gap Metabolic Acidosis Lactic Acidosis  Brief Hospital Course (including significant findings, care, treatment, and services provided and events leading to death)  Jared Ball is a 89 y.o.  male who presented to [facility] on 2022/06/18 via EMS from [facility] for evaluation following multiple syncopal episodes.  He had 2 syncopal episodes that was witnessed by his home health aide who lowered him to the ground.  He was later helped into a sitting position, but had a 3rd syncopal episode and had to be laid back on the ground prompting EMS notification.  Upon EMS arrival pts initial bp was 120/90 with a hr of 58.  However, upon standing pt had another syncopal episode witnessed by EMS his hr dropped to 38 and he became hypotensive bp 60/40.     ED Course  Upon arrival to the ER pt alert/oriented but reported he felt like he was going to pass out.  Initial vital signs: hr 30's/sbp 62-90/respiratory rate 17.  Pt  subsequently became unresponsive and hypoxic requiring mechanical intubation.  Pt requiring levophed gtt to maintain map >65.  EKG revealed sinus bradycardia with a prolonged PR interval/heart rate 38.  Cardiologist Dr. Juliann Pares consulted and pt emergently transported to the cardiac cath lab.  PCCM contacted for ICU admission.   ER lab results: CO2 17/glucose 307/creatinine 1.47/calcium 8.1/BNP 687.4/TSH 9.310   CXR: The endotracheal tube is 4.3 cm above the carina. NG tube in good position. No acute cardiopulmonary findings.  Significant Hospital Events: Including procedures, antibiotic start and stop dates in addition to other pertinent events   Jun 18, 2022: Pt admitted with syncope secondary to cardiogenic shock requiring mechanical intubation and temporary transvenous pacemaker placement  05/22: On minimal vent support, tolerating SBT ~ EXTUBATED. Cardiology is ok with discontinuation of temporary TV pacer, however pt requesting to leave it in for today.  Troponin increased to 3308, plan to start Heparin gtt for 24-48 hrs. 06-20-2022: Developed bradycardia (HR 20's) with profound refractory shock despite temporary venous pacing and multiple pressors.  Pt is in the dying process.  Decision made by family to transition to COMFORT MEASURES only.  Pt expired shortly after care withdrawn   Pertinent Labs and Studies  Significant Diagnostic Studies CARDIAC CATHETERIZATION  Result Date: 06-20-22 Conclusion: Successful placement of temporary pacer wire right femoral vein approach with adequate position capture Pacemaker was turned off because patient intrinsic rhythm improved temporarily to 70 sinus. Pacemaker was left in place in case of further bradycardia arrhythmias Will make determination about placement of permanent pacemaker   ECHOCARDIOGRAM COMPLETE  Result Date: 06/03/2022    ECHOCARDIOGRAM REPORT   Patient  Name:   Jared Ball Date of Exam: 06/03/2022 Medical Rec #:  [id number]   Height:       70.0 in  Accession #:    [id number]  Weight:       232.6 lb Date of Birth:  08/30/1932   BSA:          2.226 m Patient Age:    89 years    BP:           147/63 mmHg Patient Gender: M           HR:           63 bpm. Exam Location:  [facility] Procedure: 2D Echo, Cardiac Doppler, Color Doppler and Intracardiac            Opacification Agent Indications:     Abnormal ECG  History:         Patient has no prior history of Echocardiogram examinations.                  Abnormal ECG, Arrythmias:Atrial Fibrillation and PVC,                  Signs/Symptoms:Shortness of Breath; Risk Factors:Hypertension,                  Diabetes and Dyslipidemia.  Sonographer:     Mikki Harbor Referring Phys:  [id number] Neldon Newport NELSON Diagnosing Phys: Yvonne Kendall MD  Sonographer Comments: Technically difficult study due to poor echo windows, suboptimal parasternal window and suboptimal apical window. Image acquisition challenging due to respiratory motion. IMPRESSIONS  1. Left ventricular ejection fraction, by estimation, is 55 to 60%. The left ventricle has normal function. The left ventricle has no regional wall motion abnormalities. There is moderate left ventricular hypertrophy. Left ventricular diastolic parameters are consistent with Grade I diastolic dysfunction (impaired relaxation).  2. Right ventricular systolic function is normal. The right ventricular size is not well visualized. Mildly increased right ventricular wall thickness. There is mildly elevated pulmonary artery systolic pressure.  3. The mitral valve was not well visualized. No evidence of mitral valve regurgitation. No evidence of mitral stenosis.  4. Tricuspid valve regurgitation is mild to moderate.  5. The aortic valve was not well visualized. There is mild calcification of the aortic valve. There is mild thickening of the aortic valve. Aortic valve regurgitation is not visualized. Aortic valve sclerosis/calcification is present, without any evidence of aortic stenosis.  6.  The inferior vena cava is dilated in size with <50% respiratory variability, suggesting right atrial pressure of 15 mmHg. FINDINGS  Left Ventricle: Left ventricular ejection fraction, by estimation, is 55 to 60%. The left ventricle has normal function. The left ventricle has no regional wall motion abnormalities. Definity contrast agent was given IV to delineate the left ventricular  endocardial borders. The left ventricular internal cavity size was normal in size. There is moderate left ventricular hypertrophy. Left ventricular diastolic parameters are consistent with Grade I diastolic dysfunction (impaired relaxation). Right Ventricle: The right ventricular size is not well visualized. Mildly increased right ventricular wall thickness. Right ventricular systolic function is normal. There is mildly elevated pulmonary artery systolic pressure. The tricuspid regurgitant velocity is 2.62 m/s, and with an assumed right atrial pressure of 15 mmHg, the estimated right ventricular systolic pressure is 42.5 mmHg. Left Atrium: Left atrial size was normal in size. Right Atrium: Right atrial size was not well visualized. Pericardium: There is no evidence of pericardial effusion. Mitral Valve: The mitral valve  was not well visualized. There is mild thickening of the mitral valve leaflet(s). There is mild calcification of the mitral valve leaflet(s). No evidence of mitral valve regurgitation. No evidence of mitral valve stenosis. MV peak gradient, 3.4 mmHg. The mean mitral valve gradient is 1.0 mmHg. Tricuspid Valve: The tricuspid valve is not well visualized. Tricuspid valve regurgitation is mild to moderate. Aortic Valve: The aortic valve was not well visualized. There is mild calcification of the aortic valve. There is mild thickening of the aortic valve. Aortic valve regurgitation is not visualized. Aortic valve sclerosis/calcification is present, without any evidence of aortic stenosis. Aortic valve mean gradient measures  1.0 mmHg. Aortic valve peak gradient measures 3.0 mmHg. Aortic valve area, by VTI measures 3.81 cm. Pulmonic Valve: The pulmonic valve was not well visualized. Aorta: The aortic root is normal in size and structure. Venous: The inferior vena cava is dilated in size with less than 50% respiratory variability, suggesting right atrial pressure of 15 mmHg. IAS/Shunts: The interatrial septum was not well visualized.  LEFT VENTRICLE PLAX 2D LVIDd:         4.90 cm   Diastology LVIDs:         3.60 cm   LV e' medial:    6.64 cm/s LV PW:         1.30 cm   LV E/e' medial:  7.5 LV IVS:        1.40 cm   LV e' lateral:   7.62 cm/s LVOT diam:     1.90 cm   LV E/e' lateral: 6.5 LV SV:         67 LV SV Index:   30 LVOT Area:     2.84 cm  LEFT ATRIUM             Index LA diam:        3.30 cm 1.48 cm/m LA Vol (A2C):   51.4 ml 23.09 ml/m LA Vol (A4C):   27.3 ml 12.27 ml/m LA Biplane Vol: 37.6 ml 16.89 ml/m  AORTIC VALVE AV Area (Vmax):    3.70 cm AV Area (Vmean):   3.46 cm AV Area (VTI):     3.81 cm AV Vmax:           86.60 cm/s AV Vmean:          53.100 cm/s AV VTI:            0.177 m AV Peak Grad:      3.0 mmHg AV Mean Grad:      1.0 mmHg LVOT Vmax:         113.00 cm/s LVOT Vmean:        64.800 cm/s LVOT VTI:          0.238 m LVOT/AV VTI ratio: 1.34  AORTA Ao Root diam: 3.20 cm MITRAL VALVE               TRICUSPID VALVE MV Area (PHT): 2.57 cm    TR Peak grad:   27.5 mmHg MV Area VTI:   2.27 cm    TR Vmax:        262.00 cm/s MV Peak grad:  3.4 mmHg MV Mean grad:  1.0 mmHg    SHUNTS MV Vmax:       0.92 m/s    Systemic VTI:  0.24 m MV Vmean:      45.7 cm/s   Systemic Diam: 1.90 cm MV Decel Time: 295 msec MV E velocity: 49.80 cm/s MV A  velocity: 94.30 cm/s MV E/A ratio:  0.53 Yvonne Kendall MD Electronically signed by Yvonne Kendall MD Signature Date/Time: 06/03/2022/5:51:50 PM    Final    DG Chest Port 1 View  Result Date: 06/03/2022 CLINICAL DATA:  Bradycardia. EXAM: PORTABLE CHEST 1 VIEW COMPARISON:  Earlier radiograph  dated 06/02/2022. FINDINGS: Similar positioning of the endotracheal and enteric tubes. No interval change in the left lung base opacity, likely given tray shin of the left hemidiaphragm. No new consolidation. There is no pleural effusion or pneumothorax. Stable cardiac silhouette. Atherosclerotic calcification of the aorta. No acute osseous pathology. IMPRESSION: No interval change. Electronically Signed   By: Elgie Collard M.D.   On: 06/03/2022 02:09   DG Chest Portable 1 View  Result Date: 06/02/2022 CLINICAL DATA:  Intubation. EXAM: PORTABLE CHEST 1 VIEW COMPARISON:  Prior chest x-ray from 2015 FINDINGS: The endotracheal tube is 4.3 cm above the carina. The NG tube is coursing down the esophagus and into the stomach. The heart is mildly enlarged. The mediastinal and hilar contours are prominent but this could be due to the AP projection, portable technique and supine position of the patient. Stable moderate eventration of the left hemidiaphragm. No infiltrates, effusions or pneumothorax. IMPRESSION: 1. The endotracheal tube is 4.3 cm above the carina. 2. NG tube in good position. 3. No acute cardiopulmonary findings. Electronically Signed   By: Rudie Meyer M.D.   On: 06/02/2022 16:50    Microbiology Recent Results (from the past 240 hour(s))  MRSA Next Gen by PCR, Nasal     Status: None   Collection Time: 06/02/22  5:54 PM   Specimen: Nasal Mucosa; Nasal Swab  Result Value Ref Range Status   MRSA by PCR Next Gen NOT DETECTED NOT DETECTED Final    Comment: (NOTE) The GeneXpert MRSA Assay (FDA approved for NASAL specimens only), is one component of a comprehensive MRSA colonization surveillance program. It is not intended to diagnose MRSA infection nor to guide or monitor treatment for MRSA infections. Test performance is not FDA approved in patients less than 58 years old. Performed at [facility], [address] [place], [place] [address]   SARS Coronavirus 2 by RT PCR  (hospital order, performed in [facility] hospital lab) *cepheid single result test*     Status: None   Collection Time: 06/02/22  8:22 PM  Result Value Ref Range Status   SARS Coronavirus 2 by RT PCR NEGATIVE NEGATIVE Final    Comment: (NOTE) SARS-CoV-2 target nucleic acids are NOT DETECTED.  The SARS-CoV-2 RNA is generally detectable in upper and lower respiratory specimens during the acute phase of infection. The lowest concentration of SARS-CoV-2 viral copies this assay can detect is 250 copies / mL. A negative result does not preclude SARS-CoV-2 infection and should not be used as the sole basis for treatment or other patient management decisions.  A negative result may occur with improper specimen collection / handling, submission of specimen other than nasopharyngeal swab, presence of viral mutation(s) within the areas targeted by this assay, and inadequate number of viral copies (<250 copies / mL). A negative result must be combined with clinical observations, patient history, and epidemiological information.  Fact Sheet for Patients:   RoadLapTop.co.za  Fact Sheet for Healthcare Providers: http://kim-miller.com/  This test is not yet approved or  cleared by the [place] FDA and has been authorized for detection and/or diagnosis of SARS-CoV-2 by FDA under an Emergency Use Authorization (EUA).  This EUA will remain in  effect (meaning this test can be used) for the duration of the COVID-19 declaration under Section 564(b)(1) of the Act, 21 U.S.C. section 360bbb-3(b)(1), unless the authorization is terminated or revoked sooner.  Performed at [facility], [address] [place], [place] [address]     Lab Basic Metabolic Panel: Recent Labs  Lab 06/02/22 1505 06/02/22 1813 06/03/22 0510  0355  0953  NA 135  --  139 135 140  K 3.8  --  3.6 3.9 5.2*  CL 106  --  104 103 99  CO2 17*  --  27  24 21*  GLUCOSE 307*  --  128* 127* 377*  BUN 21  --  23 28* 30*  CREATININE 1.47*  --  1.30* 1.44* 1.79*  CALCIUM 8.1*  --  8.7* 8.4* 7.6*  MG 2.1 2.2 2.1 2.3  --   PHOS 4.4  --   --   --   --    Liver Function Tests: Recent Labs  Lab 06/02/22 1813  0953  AST 58* 95*  ALT 41 67*  ALKPHOS 73 82  BILITOT 1.5* 2.0*  PROT 6.4* 5.4*  ALBUMIN 3.4* 2.8*   No results for input(s): "LIPASE", "AMYLASE" in the last 168 hours. No results for input(s): "AMMONIA" in the last 168 hours. CBC: Recent Labs  Lab 06/02/22 1505  0355  0953  WBC 8.9 11.0* 12.5*  HGB 13.9 13.2 14.3  HCT 44.4 41.3 44.4  MCV 91.0 89.0 88.4  PLT 194 176 141*   Cardiac Enzymes: No results for input(s): "CKTOTAL", "CKMB", "CKMBINDEX", "TROPONINI" in the last 168 hours. Sepsis Labs: Recent Labs  Lab 06/02/22 1505 06/02/22 1805 06/02/22 1921  0355  0953  WBC 8.9  --   --  11.0* 12.5*  LATICACIDVEN  --  1.6 2.2*  --  >9.0*    Procedures/Operations  5/21: Temporary Transvenous Pacer placement 521: Endotracheal intubation 5/23: Left femoral CVC 5/23: Left radial Arterial line       Harlon Ditty, AGACNP-BC [facility] Prefer epic messenger for cross cover needs If after hours, please call E-link  Judithe Modest , 10:32 AM

## 2022-06-13 DEATH — deceased

## 2022-07-28 ENCOUNTER — Ambulatory Visit: Payer: Medicare HMO | Admitting: Urology

## 2022-09-24 ENCOUNTER — Ambulatory Visit (INDEPENDENT_AMBULATORY_CARE_PROVIDER_SITE_OTHER): Payer: Medicare HMO | Admitting: Vascular Surgery

## 2022-09-24 ENCOUNTER — Encounter (INDEPENDENT_AMBULATORY_CARE_PROVIDER_SITE_OTHER): Payer: Medicare HMO

## 2022-10-12 IMAGING — CT CT ANGIO NECK
3 of 7 series · 8 of 33 positions shown · IV contrast (omnipaque)
Comparison: Carotid artery duplex 08/24/2019.

CLINICAL DATA: Bilateral carotid artery stenosis. Carotid artery
stenosis; stroke/TIA, assess extracranial arteries.

EXAM:
CT ANGIOGRAPHY NECK
TECHNIQUE: Multidetector CT imaging of the neck was performed using the
standard protocol during bolus administration of intravenous
contrast. Multiplanar CT image reconstructions and MIPs were
obtained to evaluate the vascular anatomy. Carotid stenosis
measurements (when applicable) are obtained utilizing NASCET
criteria, using the distal internal carotid diameter as the
denominator.
CONTRAST:  75mL OMNIPAQUE IOHEXOL 350 MG/ML SOLN

[Series 6: cta neck cta neck (person_name) 2.00 · axial · 0.64mm/px · z∈[-697,-621]mm · 2 of 116 slices shown]
[im 39/116  soft-tissue]
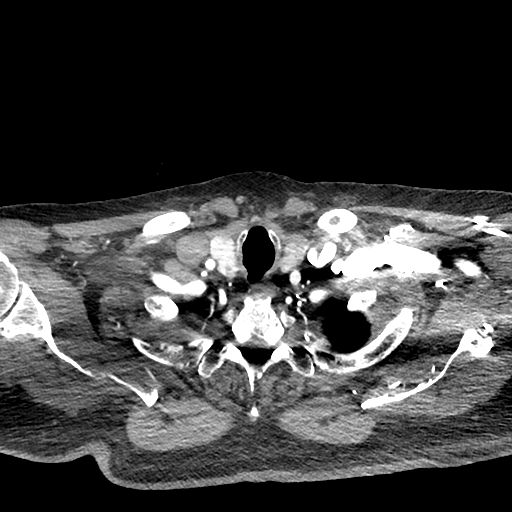
[im 77/116  soft-tissue]
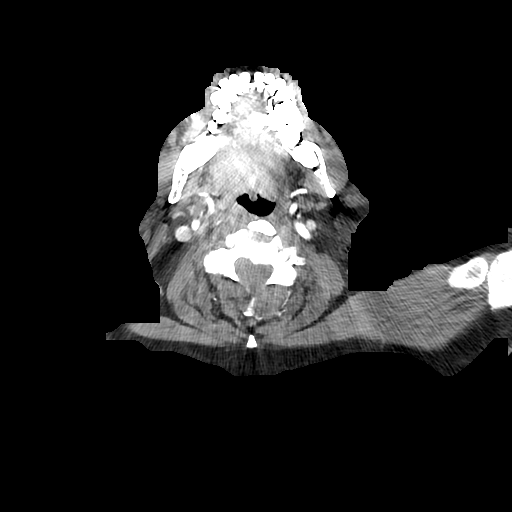

[Series 7: ax thin mips cta neck 1.00 ax · axial · 0.64mm/px · z∈[-822,-624]mm · 5 of 328 slices shown]
[im 55/328  soft-tissue]
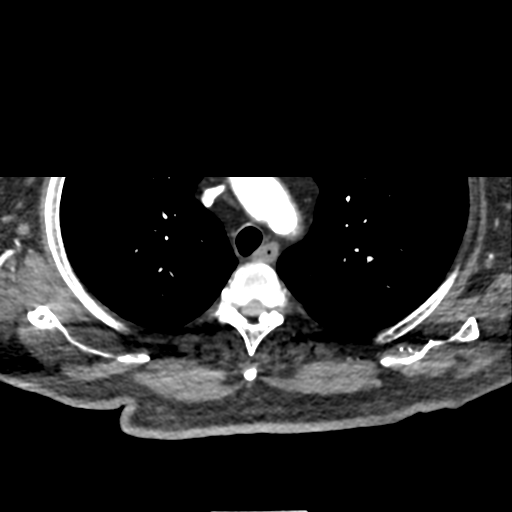
[im 110/328  bone]
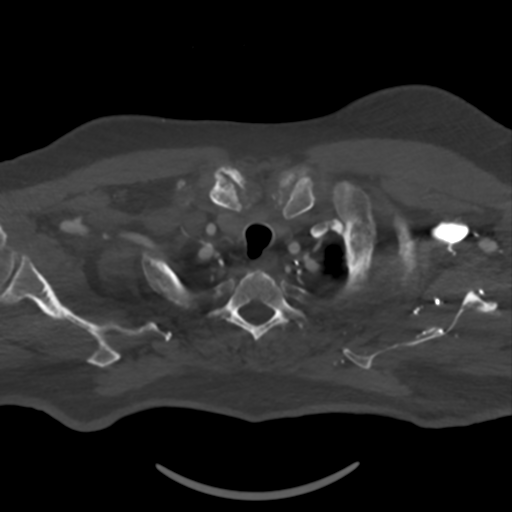
[im 164/328  soft-tissue]
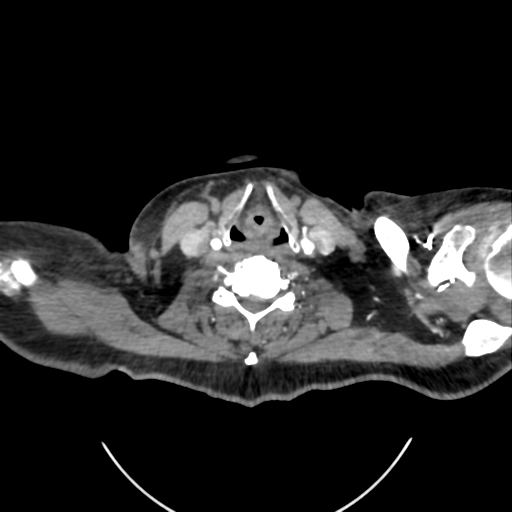
[im 219/328  bone]
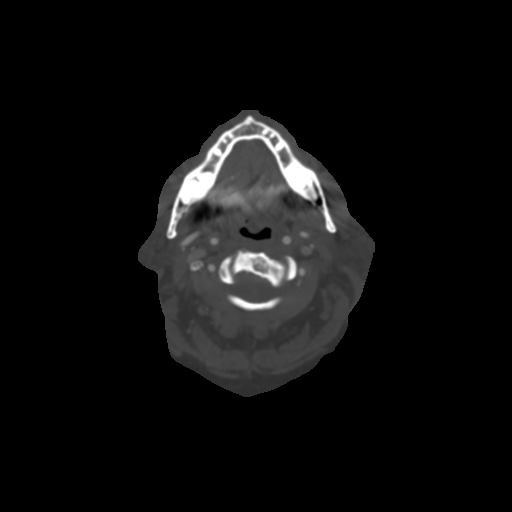
[im 273/328  soft-tissue]
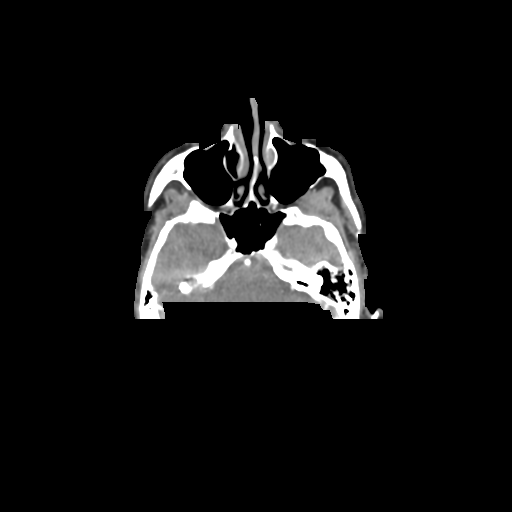

[Series 11: sag thin mips cta neck 1.00 sag · sagittal · 0.56mm/px · 1 of 328 slices shown]
[im 164/328  soft-tissue]
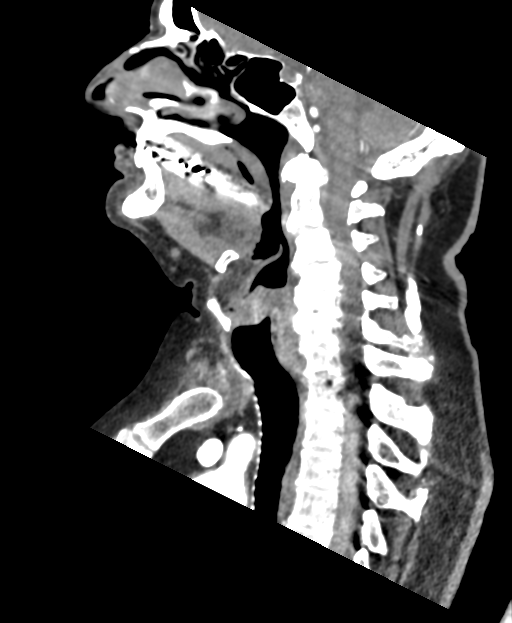

[8 of 33 positions shown; findings below may reference images not displayed]

FINDINGS: Aortic arch: The origin of the innominate and left common carotid
arteries. Atherosclerotic plaque within the visualized aortic arch
and proximal major branch vessels of the neck. No hemodynamically
significant innominate or proximal subclavian artery stenosis.

Right carotid system: CCA and ICA patent within the neck without
measurable stenosis. Minimal calcified plaque within the proximal
ICA. Calcified plaque within the intracranial right ICA with no more
than mild stenosis at the imaged levels.

Left carotid system: CCA and ICA patent within the neck. Soft and
calcified plaque within the carotid bifurcation, proximal ICA and
proximal ECA. Severe stenosis of the proximal ICA estimated at
greater than 90% (series 6, image 69). Mild soft calcified plaque is
also present more distally within the cervical ICA. Calcified plaque
within the intracranial left ICA with no more than mild stenosis at
the imaged levels

Vertebral arteries: Vertebral arteries patent within the neck. Right
vertebral artery slightly dominant. Severe atherosclerotic stenosis
at the origin of the right vertebral artery. Moderate narrowing of
the right vertebral artery at the C4 level due to mass effect from
adjacent endplate osteophyte. Mild atherosclerotic narrowing at the
origin of the left vertebral artery.

Skeleton: Cervical spondylosis. No acute bony abnormality or
aggressive osseous lesion.

Other neck: No neck mass or cervical lymphadenopathy. Subcentimeter
thyroid nodule within the right lobe, not meeting consensus criteria
for ultrasound follow-up.

Upper chest: No consolidation within the imaged lung apices.
IMPRESSION: Severe stenosis of the proximal left internal carotid artery
(estimated at greater than 90%).

No hemodynamically significant stenosis within the common carotid or
cervical internal carotid arteries on the right. Minimal calcified
plaque within the proximal right ICA.

Severe atherosclerotic stenosis at the origin of the dominant right
vertebral artery. Moderate narrowing of the right vertebral artery
at the C4 level due to mass effect from and adjacent endplate
osteophyte.

Mild atherosclerotic narrowing at the origin of the non dominant
left vertebral artery.
